# Patient Record
Sex: Male | Born: 1935
Health system: Southern US, Community
[De-identification: ages and names within clinical notes are randomized; demographics above are authoritative.]

## PROBLEM LIST (undated history)

## (undated) DIAGNOSIS — IMO0002 Reserved for concepts with insufficient information to code with codable children: Secondary | ICD-10-CM

## (undated) DIAGNOSIS — M1990 Unspecified osteoarthritis, unspecified site: Secondary | ICD-10-CM

## (undated) DIAGNOSIS — I10 Essential (primary) hypertension: Secondary | ICD-10-CM

## (undated) DIAGNOSIS — I739 Peripheral vascular disease, unspecified: Secondary | ICD-10-CM

## (undated) DIAGNOSIS — I779 Disorder of arteries and arterioles, unspecified: Secondary | ICD-10-CM

## (undated) DIAGNOSIS — G9340 Encephalopathy, unspecified: Secondary | ICD-10-CM

## (undated) DIAGNOSIS — I219 Acute myocardial infarction, unspecified: Secondary | ICD-10-CM

## (undated) DIAGNOSIS — I2510 Atherosclerotic heart disease of native coronary artery without angina pectoris: Secondary | ICD-10-CM

## (undated) DIAGNOSIS — D649 Anemia, unspecified: Secondary | ICD-10-CM

## (undated) HISTORY — DX: Acute myocardial infarction, unspecified: I21.9

---
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
## 1973-01-13 HISTORY — PX: OTHER SURGICAL HISTORY: SHX169

## 2010-12-12 DIAGNOSIS — E113299 Type 2 diabetes mellitus with mild nonproliferative diabetic retinopathy without macular edema, unspecified eye: Secondary | ICD-10-CM | POA: Insufficient documentation

## 2010-12-12 DIAGNOSIS — H348392 Tributary (branch) retinal vein occlusion, unspecified eye, stable: Secondary | ICD-10-CM | POA: Insufficient documentation

## 2011-06-05 DIAGNOSIS — H43819 Vitreous degeneration, unspecified eye: Secondary | ICD-10-CM | POA: Insufficient documentation

## 2011-06-05 DIAGNOSIS — Z961 Presence of intraocular lens: Secondary | ICD-10-CM | POA: Insufficient documentation

## 2011-06-28 ENCOUNTER — Encounter (HOSPITAL_COMMUNITY): Payer: Self-pay | Admitting: Emergency Medicine

## 2011-06-28 ENCOUNTER — Emergency Department (HOSPITAL_COMMUNITY): Payer: Medicare Other

## 2011-06-28 ENCOUNTER — Inpatient Hospital Stay (HOSPITAL_COMMUNITY)
Admission: AD | Admit: 2011-06-28 | Discharge: 2011-07-04 | DRG: 246 | Disposition: A | Discharge status: Home / Self Care | Payer: Medicare Other | Attending: Cardiology | Admitting: Cardiology

## 2011-06-28 DIAGNOSIS — I214 Non-ST elevation (NSTEMI) myocardial infarction: Principal | ICD-10-CM

## 2011-06-28 DIAGNOSIS — E1169 Type 2 diabetes mellitus with other specified complication: Secondary | ICD-10-CM | POA: Diagnosis present

## 2011-06-28 DIAGNOSIS — I10 Essential (primary) hypertension: Secondary | ICD-10-CM | POA: Diagnosis present

## 2011-06-28 DIAGNOSIS — Z794 Long term (current) use of insulin: Secondary | ICD-10-CM

## 2011-06-28 DIAGNOSIS — G92 Toxic encephalopathy: Secondary | ICD-10-CM | POA: Diagnosis not present

## 2011-06-28 DIAGNOSIS — Z7902 Long term (current) use of antithrombotics/antiplatelets: Secondary | ICD-10-CM

## 2011-06-28 DIAGNOSIS — Z7982 Long term (current) use of aspirin: Secondary | ICD-10-CM

## 2011-06-28 DIAGNOSIS — I4891 Unspecified atrial fibrillation: Secondary | ICD-10-CM | POA: Diagnosis present

## 2011-06-28 DIAGNOSIS — G929 Unspecified toxic encephalopathy: Secondary | ICD-10-CM | POA: Diagnosis not present

## 2011-06-28 DIAGNOSIS — I2510 Atherosclerotic heart disease of native coronary artery without angina pectoris: Secondary | ICD-10-CM | POA: Diagnosis present

## 2011-06-28 DIAGNOSIS — D649 Anemia, unspecified: Secondary | ICD-10-CM | POA: Diagnosis present

## 2011-06-28 DIAGNOSIS — T50995A Adverse effect of other drugs, medicaments and biological substances, initial encounter: Secondary | ICD-10-CM | POA: Diagnosis not present

## 2011-06-28 DIAGNOSIS — Z79899 Other long term (current) drug therapy: Secondary | ICD-10-CM

## 2011-06-28 HISTORY — DX: Reserved for concepts with insufficient information to code with codable children: IMO0002

## 2011-06-28 HISTORY — DX: Anemia, unspecified: D64.9

## 2011-06-28 HISTORY — DX: Essential (primary) hypertension: I10

## 2011-06-28 HISTORY — DX: Atherosclerotic heart disease of native coronary artery without angina pectoris: I25.10

## 2011-06-28 HISTORY — DX: Disorder of arteries and arterioles, unspecified: I77.9

## 2011-06-28 HISTORY — DX: Encephalopathy, unspecified: G93.40

## 2011-06-28 HISTORY — DX: Peripheral vascular disease, unspecified: I73.9

## 2011-06-28 LAB — BASIC METABOLIC PANEL
Chloride: 101 mEq/L (ref 96–112)
GFR calc Af Amer: 58 mL/min — ABNORMAL LOW (ref >90)
GFR calc non Af Amer: 50 mL/min — ABNORMAL LOW (ref >90)
Glucose, Bld: 245 mg/dL — ABNORMAL HIGH (ref 70–99)
Potassium: 3.9 mEq/L (ref 3.5–5.1)
Sodium: 136 mEq/L (ref 135–145)

## 2011-06-28 LAB — CBC
Hemoglobin: 13.3 g/dL (ref 13.0–17.0)
MCHC: 35.1 g/dL (ref 30.0–36.0)
RDW: 12.9 % (ref 11.5–15.5)
WBC: 6.2 10*3/uL (ref 4.0–10.5)

## 2011-06-28 LAB — POCT I-STAT TROPONIN I

## 2011-06-28 MED ORDER — HEPARIN (PORCINE) IN NACL 100-0.45 UNIT/ML-% IJ SOLN
950.0000 [IU]/h | INTRAMUSCULAR | Status: DC
  Administered 2011-06-28: 1000 [IU]/h via INTRAVENOUS
  Administered 2011-06-29: 850 [IU]/h via INTRAVENOUS
  Administered 2011-06-30: 950 [IU]/h via INTRAVENOUS
  Filled 2011-06-28 (×4): qty 250

## 2011-06-28 MED ORDER — ASPIRIN 81 MG PO CHEW
324.0000 mg | CHEWABLE_TABLET | Freq: Once | ORAL | Status: AC
Start: 2011-06-29 — End: 2011-07-01
  Administered 2011-07-01: 324 mg via ORAL
  Filled 2011-06-28 (×3): qty 4

## 2011-06-28 MED ORDER — HEPARIN BOLUS VIA INFUSION
4000.0000 [IU] | Freq: Once | INTRAVENOUS | Status: AC
  Administered 2011-06-28: 4000 [IU] via INTRAVENOUS

## 2011-06-28 MED ORDER — ONDANSETRON HCL 4 MG/2ML IJ SOLN
4.0000 mg | Freq: Once | INTRAMUSCULAR | Status: AC
  Administered 2011-06-29: 4 mg via INTRAVENOUS
  Filled 2011-06-28: qty 2

## 2011-06-28 MED ORDER — MORPHINE SULFATE 4 MG/ML IJ SOLN
4.0000 mg | Freq: Once | INTRAMUSCULAR | Status: AC
  Administered 2011-06-29: 4 mg via INTRAVENOUS
  Filled 2011-06-28: qty 1

## 2011-06-28 NOTE — ED Notes (Signed)
Pt alert, c/o mid sternal chest pain,  Pain described dull, radiates left to right, onset was last week, pt states seen PCP given scripts for reflux, resp even unlabored, skin pwd

## 2011-06-28 NOTE — ED Provider Notes (Signed)
History     CSN: [id number]  Arrival date & time 06/28/11  2217   First MD Initiated Contact with Patient 06/28/11 2316      Chief Complaint  Patient presents with  . Chest Pain    (Consider location/radiation/quality/duration/timing/severity/associated sxs/prior treatment) HPI History provided by patient and family bedside. Has been having on and off chest pain all week, evaluated by PCP and started on Protonix. Today woke up feeling well in his normal state of health, did yard work during the day and tonight developed chest pain across his chest. He describes it as dull and radiates to both right and left arm. No shortness of breath. No diaphoresis. No nausea. No history of similar symptoms. He denies any burning or reflux. No leg pain or swelling. No fever chills. No cough. No history of known heart disease. History of diabetes and hypertension. Pain is 5/10 at this time. No known aggravating or alleviating factors.  Past Medical History  Diagnosis Date  . Diabetes mellitus   . Hypertension     Past Surgical History  Procedure Date  . Appendectomy     No family history on file.  History  Substance Use Topics  . Smoking status: Never Smoker   . Smokeless tobacco: Not on file  . Alcohol Use: No      Review of Systems  Constitutional: Negative for fever and chills.  HENT: Negative for neck pain and neck stiffness.   Eyes: Negative for pain.  Respiratory: Negative for shortness of breath.   Cardiovascular: Positive for chest pain.  Gastrointestinal: Negative for abdominal pain.  Genitourinary: Negative for dysuria.  Musculoskeletal: Negative for back pain.  Skin: Negative for rash.  Neurological: Negative for headaches.  All other systems reviewed and are negative.    Allergies  Review of patient's allergies indicates no known allergies.  Home Medications   Current Outpatient Rx  Name Route Sig Dispense Refill  . ASPIRIN 325 MG PO TABS Oral Take 325 mg by  mouth daily.    Marland Kitchen DIPHENHYDRAMINE HCL 25 MG PO CAPS Oral Take 25 mg by mouth daily.    . GLYBURIDE 5 MG PO TABS Oral Take 10 mg by mouth 2 (two) times daily with a meal.    . INSULIN DETEMIR 100 UNIT/ML [place] SOLN Subcutaneous Inject 15-38 Units into the skin at bedtime. 15 at bedtime 36-38 in the morning    . LEVOTHYROXINE SODIUM 25 MCG PO TABS Oral Take 25 mcg by mouth daily.    Marland Kitchen LISINOPRIL-HYDROCHLOROTHIAZIDE 10-12.5 MG PO TABS Oral Take 1 tablet by mouth daily.    Marland Kitchen NAPROXEN SODIUM 220 MG PO TABS Oral Take 220 mg by mouth 2 (two) times daily with a meal.    . OMEGA-3-ACID ETHYL ESTERS 1 G PO CAPS Oral Take 1 g by mouth 3 (three) times daily.    Marland Kitchen PANTOPRAZOLE SODIUM 40 MG PO TBEC Oral Take 40 mg by mouth daily.    Marland Kitchen SIMVASTATIN 40 MG PO TABS Oral Take 20 mg by mouth at bedtime.      BP 179/69  Pulse 80  Temp 97.7 F (36.5 C) (Oral)  Resp 16  Wt 175 lb (79.379 kg)  SpO2 100%  Physical Exam  Constitutional: He is oriented to person, place, and time. He appears well-developed and well-nourished.  HENT:  Head: Normocephalic and atraumatic.  Eyes: Conjunctivae and EOM are normal. Pupils are equal, round, and reactive to light.  Neck: Trachea normal. Neck supple. No thyromegaly present.  Cardiovascular: Normal rate, regular rhythm, S1 normal, S2 normal and normal pulses.     No systolic murmur is present   No diastolic murmur is present  Pulses:      Radial pulses are 2+ on the right side, and 2+ on the left side.  Pulmonary/Chest: Effort normal and breath sounds normal. He has no wheezes. He has no rhonchi. He has no rales. He exhibits no tenderness.  Abdominal: Soft. Normal appearance and bowel sounds are normal. There is no tenderness. There is no CVA tenderness and negative Murphy's sign.  Musculoskeletal:       BLE:s Calves nontender, no cords or erythema, negative Homans sign  Neurological: He is alert and oriented to person, place, and time. He has normal strength. No cranial  nerve deficit or sensory deficit. GCS eye subscore is 4. GCS verbal subscore is 5. GCS motor subscore is 6.  Skin: Skin is warm and dry. No rash noted. He is not diaphoretic.  Psychiatric: His speech is normal.       Cooperative and appropriate    ED Course  Procedures (including critical care time)  Labs Reviewed  CBC - Abnormal; Notable for the following:    RBC 4.05 (*)     HCT 37.9 (*)     Platelets 148 (*)     All other components within normal limits  BASIC METABOLIC PANEL - Abnormal; Notable for the following:    Glucose, Bld 245 (*)     BUN 27 (*)     GFR calc non Af Amer 50 (*)     GFR calc Af Amer 58 (*)     All other components within normal limits  POCT I-STAT TROPONIN I - Abnormal; Notable for the following:    Troponin i, poc 0.28 (*)     All other components within normal limits   Dg Chest 2 View  06/28/2011  *RADIOLOGY REPORT*  Clinical Data: Left-sided chest pain; history of diabetes.  CHEST - 2 VIEW  Comparison: None.  Findings: Mild left basilar opacity, better seen on the lateral view, may reflect atelectasis given elevation of the left hemidiaphragm.  There is no evidence of pleural effusion or pneumothorax.  The heart is borderline normal in size; the mediastinal contour is within normal limits.  No acute osseous abnormalities are seen.  IMPRESSION: Elevation of the left hemidiaphragm; mild left basilar opacity likely reflects atelectasis, though mild pneumonia could conceivably have a similar appearance.  Original Report Authenticated By: Tonia Ghent, M.D.    Date: 06/29/2011  Rate: 86  Rhythm: normal sinus rhythm  QRS Axis: left  Intervals: PR prolonged  ST/T Wave abnormalities: nonspecific ST/T changes  Conduction Disutrbances:first-degree A-V block  and right bundle branch block  Narrative Interpretation: T wave inversions V1-V3  Old EKG Reviewed: none available  EKG reviewed. Labs and imaging reviewed as above.   11:58 PM d/w Dr Isabella Stalling, fellow  on call for CAR, he accepts PT in Tx to [facility] for NSTEMI.   CRITICAL CARE Performed by: Sunnie Nielsen   Total critical care time: 45  Critical care time was exclusive of separately billable procedures and treating other patients.  Critical care was necessary to treat or prevent imminent or life-threatening deterioration.  Critical care was time spent personally by me on the following activities: development of treatment plan with patient and/or surrogate as well as nursing, discussions with consultants, evaluation of patient's response to treatment, examination of patient, obtaining history from patient or surrogate, ordering and  performing treatments and interventions, ordering and review of laboratory studies, ordering and review of radiographic studies, pulse oximetry and re-evaluation of patient's condition.  MDM    chest pain concerning for ACS with EKG reviewed as above. Aspirin prior to arrival 325 mg by mouth. IV morphine for pain. Troponin reviewed as above elevated without ST elevations on EKG. No back pain or tearing pain or widened mediastinum on chest x-ray to suggest dissection. Heparin initiated. Discuss with cardiologist as above. Plan transfer to [facility] cone for admission to cardiac ICU.          Sunnie Nielsen, MD 06/29/11 [phone]

## 2011-06-28 NOTE — ED Notes (Signed)
Pt states he went to his [facility] June 10th and he received a prescription for protonix for indigestion,  He says this is same type of pain tonight,  Denies sob, but admits to some nausea,  Pt is alert and oriented in NAD, pt lives at home by himself.

## 2011-06-29 ENCOUNTER — Encounter (HOSPITAL_COMMUNITY): Payer: Self-pay | Admitting: *Deleted

## 2011-06-29 DIAGNOSIS — I214 Non-ST elevation (NSTEMI) myocardial infarction: Secondary | ICD-10-CM

## 2011-06-29 DIAGNOSIS — R079 Chest pain, unspecified: Secondary | ICD-10-CM

## 2011-06-29 LAB — DIFFERENTIAL
Basophils Absolute: 0 10*3/uL (ref 0.0–0.1)
Basophils Relative: 1 % (ref 0–1)
Eosinophils Absolute: 0.1 10*3/uL (ref 0.0–0.7)
Eosinophils Relative: 1 % (ref 0–5)
Lymphocytes Relative: 21 % (ref 12–46)
Lymphs Abs: 1.1 10*3/uL (ref 0.7–4.0)
Monocytes Absolute: 0.4 10*3/uL (ref 0.1–1.0)
Monocytes Relative: 7 % (ref 3–12)
Neutro Abs: 3.7 10*3/uL (ref 1.7–7.7)
Neutrophils Relative %: 70 % (ref 43–77)

## 2011-06-29 LAB — CARDIAC PANEL(CRET KIN+CKTOT+MB+TROPI)
CK, MB: 95.8 ng/mL (ref 0.3–4.0)
CK, MB: 162.2 ng/mL (ref 0.3–4.0)
Relative Index: 8.9 — ABNORMAL HIGH (ref 0.0–2.5)
Relative Index: 8.2 — ABNORMAL HIGH (ref 0.0–2.5)
Relative Index: 6 — ABNORMAL HIGH (ref 0.0–2.5)
Total CK: 1074 U/L — ABNORMAL HIGH (ref 7–232)
Troponin I: 5.53 ng/mL (ref <0.30)
Troponin I: 15.31 ng/mL (ref <0.30)
Troponin I: >20 ng/mL (ref <0.30)

## 2011-06-29 LAB — BASIC METABOLIC PANEL
BUN: 23 mg/dL (ref 6–23)
CO2: 22 mEq/L (ref 19–32)
Chloride: 103 mEq/L (ref 96–112)
Creatinine, Ser: 1.09 mg/dL (ref 0.50–1.35)
Glucose, Bld: 191 mg/dL — ABNORMAL HIGH (ref 70–99)

## 2011-06-29 LAB — LIPID PANEL
Cholesterol: 139 mg/dL (ref 0–200)
HDL: 58 mg/dL (ref >39)
HDL: 62 mg/dL (ref >39)
LDL Cholesterol: 75 mg/dL (ref 0–99)
LDL Cholesterol: 75 mg/dL (ref 0–99)
Total CHOL/HDL Ratio: 2.4 RATIO
Total CHOL/HDL Ratio: 2.3 RATIO
Triglycerides: 29 mg/dL (ref <150)
VLDL: 6 mg/dL (ref 0–40)
VLDL: 7 mg/dL (ref 0–40)

## 2011-06-29 LAB — COMPREHENSIVE METABOLIC PANEL
AST: 43 U/L — ABNORMAL HIGH (ref 0–37)
Albumin: 3.3 g/dL — ABNORMAL LOW (ref 3.5–5.2)
Chloride: 105 mEq/L (ref 96–112)
Creatinine, Ser: 1.09 mg/dL (ref 0.50–1.35)
Total Bilirubin: 0.4 mg/dL (ref 0.3–1.2)

## 2011-06-29 LAB — MRSA PCR SCREENING: MRSA by PCR: NEGATIVE

## 2011-06-29 LAB — PROTIME-INR
INR: 1.17 (ref 0.00–1.49)
Prothrombin Time: 15.1 seconds (ref 11.6–15.2)

## 2011-06-29 LAB — APTT: aPTT: >200 seconds (ref 24–37)

## 2011-06-29 LAB — GLUCOSE, CAPILLARY: Glucose-Capillary: 246 mg/dL — ABNORMAL HIGH (ref 70–99)

## 2011-06-29 LAB — PRO B NATRIURETIC PEPTIDE: Pro B Natriuretic peptide (BNP): 734.7 pg/mL — ABNORMAL HIGH (ref 0–450)

## 2011-06-29 LAB — CBC
MCV: 92.8 fL (ref 78.0–100.0)
Platelets: 133 10*3/uL — ABNORMAL LOW (ref 150–400)
RDW: 12.9 % (ref 11.5–15.5)
WBC: 5.3 10*3/uL (ref 4.0–10.5)

## 2011-06-29 LAB — TSH: TSH: 4.869 u[IU]/mL — ABNORMAL HIGH (ref 0.350–4.500)

## 2011-06-29 LAB — HEMOGLOBIN A1C
Hgb A1c MFr Bld: 8 % — ABNORMAL HIGH (ref <5.7)
Mean Plasma Glucose: 183 mg/dL — ABNORMAL HIGH (ref <117)

## 2011-06-29 LAB — HEPARIN LEVEL (UNFRACTIONATED)
Heparin Unfractionated: 0.9 IU/mL — ABNORMAL HIGH (ref 0.30–0.70)
Heparin Unfractionated: 0.53 IU/mL (ref 0.30–0.70)

## 2011-06-29 MED ORDER — ACETAMINOPHEN 325 MG PO TABS: 650.0000 mg | ORAL_TABLET | ORAL | Status: DC | PRN

## 2011-06-29 MED ORDER — ONDANSETRON HCL 4 MG/2ML IJ SOLN: 4.0000 mg | Freq: Four times a day (QID) | INTRAMUSCULAR | Status: DC | PRN

## 2011-06-29 MED ORDER — SODIUM CHLORIDE 0.9 % IV SOLN
INTRAVENOUS | Status: DC
  Administered 2011-06-30 – 2011-07-01 (×2): via INTRAVENOUS

## 2011-06-29 MED ORDER — GLYBURIDE 5 MG PO TABS
10.0000 mg | ORAL_TABLET | Freq: Two times a day (BID) | ORAL | Status: DC
  Administered 2011-06-29 (×2): 10 mg via ORAL
  Filled 2011-06-29 (×7): qty 2

## 2011-06-29 MED ORDER — ONDANSETRON 8 MG/NS 50 ML IVPB: 8.0000 mg | Freq: Three times a day (TID) | INTRAVENOUS | Status: DC | PRN

## 2011-06-29 MED ORDER — LISINOPRIL-HYDROCHLOROTHIAZIDE 10-12.5 MG PO TABS: 1.0000 | ORAL_TABLET | Freq: Every day | ORAL | Status: DC

## 2011-06-29 MED ORDER — HYDROMORPHONE HCL PF 1 MG/ML IJ SOLN: 0.5000 mg | INTRAMUSCULAR | Status: DC | PRN

## 2011-06-29 MED ORDER — HYDROCHLOROTHIAZIDE 12.5 MG PO CAPS
12.5000 mg | ORAL_CAPSULE | Freq: Every day | ORAL | Status: DC
  Administered 2011-06-29 – 2011-07-04 (×6): 12.5 mg via ORAL
  Filled 2011-06-29 (×6): qty 1

## 2011-06-29 MED ORDER — MORPHINE SULFATE 2 MG/ML IJ SOLN: 2.0000 mg | INTRAMUSCULAR | Status: DC | PRN

## 2011-06-29 MED ORDER — ASPIRIN EC 81 MG PO TBEC
81.0000 mg | DELAYED_RELEASE_TABLET | Freq: Every day | ORAL | Status: DC
  Administered 2011-06-30 – 2011-07-04 (×4): 81 mg via ORAL
  Filled 2011-06-29 (×5): qty 1

## 2011-06-29 MED ORDER — INSULIN DETEMIR 100 UNIT/ML ~~LOC~~ SOLN: 15.0000 [IU] | Freq: Every day | SUBCUTANEOUS | Status: DC

## 2011-06-29 MED ORDER — PANTOPRAZOLE SODIUM 40 MG PO TBEC
40.0000 mg | DELAYED_RELEASE_TABLET | Freq: Every day | ORAL | Status: DC
  Administered 2011-06-30 – 2011-07-04 (×5): 40 mg via ORAL
  Filled 2011-06-29 (×5): qty 1

## 2011-06-29 MED ORDER — NITROGLYCERIN 0.4 MG SL SUBL: 0.4000 mg | SUBLINGUAL_TABLET | SUBLINGUAL | Status: DC | PRN

## 2011-06-29 MED ORDER — LISINOPRIL 10 MG PO TABS
10.0000 mg | ORAL_TABLET | Freq: Every day | ORAL | Status: DC
  Administered 2011-06-29 – 2011-07-04 (×6): 10 mg via ORAL
  Filled 2011-06-29 (×6): qty 1

## 2011-06-29 MED ORDER — INSULIN DETEMIR 100 UNIT/ML ~~LOC~~ SOLN
38.0000 [IU] | Freq: Every day | SUBCUTANEOUS | Status: DC
  Administered 2011-06-29: 38 [IU] via SUBCUTANEOUS
  Filled 2011-06-29: qty 10

## 2011-06-29 MED ORDER — CARVEDILOL 6.25 MG PO TABS
6.2500 mg | ORAL_TABLET | Freq: Two times a day (BID) | ORAL | Status: DC
  Administered 2011-06-29 – 2011-07-04 (×8): 6.25 mg via ORAL
  Filled 2011-06-29 (×13): qty 1

## 2011-06-29 MED ORDER — ISOSORBIDE MONONITRATE ER 30 MG PO TB24
30.0000 mg | ORAL_TABLET | Freq: Every day | ORAL | Status: DC
  Administered 2011-06-29 – 2011-07-04 (×5): 30 mg via ORAL
  Filled 2011-06-29 (×7): qty 1

## 2011-06-29 MED ORDER — FUROSEMIDE 10 MG/ML IJ SOLN
20.0000 mg | Freq: Once | INTRAMUSCULAR | Status: AC
  Administered 2011-06-29: 20 mg via INTRAVENOUS
  Filled 2011-06-29: qty 2

## 2011-06-29 MED ORDER — SODIUM CHLORIDE 0.9 % IJ SOLN
3.0000 mL | Freq: Two times a day (BID) | INTRAMUSCULAR | Status: DC
  Administered 2011-06-29 – 2011-07-02 (×7): 3 mL via INTRAVENOUS

## 2011-06-29 MED ORDER — MORPHINE SULFATE 2 MG/ML IJ SOLN
INTRAMUSCULAR | Status: AC
  Administered 2011-06-29: 2 mg via INTRAVENOUS
  Filled 2011-06-29: qty 1

## 2011-06-29 MED ORDER — SIMVASTATIN 20 MG PO TABS
20.0000 mg | ORAL_TABLET | Freq: Every day | ORAL | Status: DC
  Administered 2011-06-29 – 2011-07-03 (×4): 20 mg via ORAL
  Filled 2011-06-29 (×6): qty 1

## 2011-06-29 MED ORDER — INSULIN DETEMIR 100 UNIT/ML ~~LOC~~ SOLN
15.0000 [IU] | Freq: Every day | SUBCUTANEOUS | Status: DC
  Administered 2011-06-29: 15 [IU] via SUBCUTANEOUS
  Filled 2011-06-29: qty 10

## 2011-06-29 MED ORDER — ONDANSETRON HCL 4 MG/2ML IJ SOLN
INTRAMUSCULAR | Status: AC
  Administered 2011-06-29: 4 mg
  Filled 2011-06-29: qty 2

## 2011-06-29 MED ORDER — LEVOTHYROXINE SODIUM 25 MCG PO TABS
25.0000 ug | ORAL_TABLET | Freq: Every day | ORAL | Status: DC
  Administered 2011-06-29 – 2011-07-04 (×6): 25 ug via ORAL
  Filled 2011-06-29 (×7): qty 1

## 2011-06-29 MED ORDER — OMEGA-3-ACID ETHYL ESTERS 1 G PO CAPS
1.0000 g | ORAL_CAPSULE | Freq: Three times a day (TID) | ORAL | Status: DC
  Administered 2011-06-29 – 2011-07-04 (×13): 1 g via ORAL
  Filled 2011-06-29 (×18): qty 1

## 2011-06-29 NOTE — Progress Notes (Signed)
eLink Physician-Brief Progress Note Patient Name: Levi Robinson DOB: 02/14/1921 MRN: [id number]  Date of Service  06/29/2011   HPI/Events of Note  Called nursing re tachycardia, hypertension.  Patient vomiting.  May be reaction to morhpine.     eICU Interventions  Zofran q4 hours PNR Change morphine to dilaudid Can assist with antihypertensives if primary team unavailable shortly.     Intervention Category Intermediate Interventions: Communication with other healthcare providers and/or family;Pain - evaluation and management;Other:  Levi Robinson 06/29/2011, 1:51 AM

## 2011-06-29 NOTE — Progress Notes (Signed)
ANTICOAGULATION CONSULT NOTE - Follow-up Consult  Pharmacy Consult for Heparin Indication: chest pain/ACS  No Known Allergies  Patient Measurements: Height: 5\' 5"  (165.1 cm) Weight: 163 lb 9.3 oz (74.2 kg) IBW/kg (Calculated) : 61.5  Heparin Dosing Weight: 70 kg   Vital Signs: Temp: 97.8 F (36.6 C) (06/16 0700) Temp src: Oral (06/16 0700) BP: 117/49 mmHg (06/16 0700) Pulse Rate: 56  (06/16 0700)  Labs:  Basename 06/29/11 0850 06/29/11 0505 06/29/11 0500 06/28/11 2301  HGB -- 12.2* -- 13.3  HCT -- 36.0* -- 37.9*  PLT -- 133* -- 148*  APTT -- >200* -- --  LABPROT -- 15.1 -- --  INR -- 1.17 -- --  HEPARINUNFRC 0.90* -- -- --  CREATININE -- 1.09 1.09 1.25  CKTOTAL -- -- 1074* --  CKMB -- -- 95.8* --  TROPONINI -- -- 5.53* --    Estimated Creatinine Clearance: 45 ml/min (by C-G formula based on Cr of 1.09).   Medical History: Past Medical History  Diagnosis Date  . Diabetes mellitus   . Hypertension     Medications:  Prescriptions prior to admission  Medication Sig Dispense Refill  . aspirin 325 MG tablet Take 325 mg by mouth daily.      . diphenhydrAMINE (BENADRYL) 25 mg capsule Take 25 mg by mouth daily.      Marland Kitchen glyBURIDE (DIABETA) 5 MG tablet Take 10 mg by mouth 2 (two) times daily with a meal.      . insulin detemir (LEVEMIR) 100 UNIT/ML injection Inject 15-38 Units into the skin at bedtime. 15 at bedtime 36-38 in the morning      . levothyroxine (SYNTHROID, LEVOTHROID) 25 MCG tablet Take 25 mcg by mouth daily.      Marland Kitchen lisinopril-hydrochlorothiazide (PRINZIDE,ZESTORETIC) 10-12.5 MG per tablet Take 1 tablet by mouth daily.      . naproxen sodium (ANAPROX) 220 MG tablet Take 220 mg by mouth 2 (two) times daily with a meal.      . omega-3 acid ethyl esters (LOVAZA) 1 G capsule Take 1 g by mouth 3 (three) times daily.      . pantoprazole (PROTONIX) 40 MG tablet Take 40 mg by mouth daily.      . simvastatin (ZOCOR) 40 MG tablet Take 20 mg by mouth at bedtime.         Assessment: 78 yo M admitted with chest pain, found with NSTEMI. Placed on heparin in anticipation for elective cath tmrw. Currently supratherapeutic on 1000 units/hr. Hgb and Plt ct are slightly low. Patient denies any bleeding/bruising.   Goal of Therapy:  Heparin level 0.3-0.7 units/ml Monitor platelets by anticoagulation protocol: Yes   Plan:  1. Decrease heparin to 850 units/hr = 8.5 mL/hr 2. Repeat heparin level 8 hours after rate change @1800 . 3. Monitor CBC and for Sx bleeding  Frutoso Chase, PharmD pgr [phone] 06/29/2011, 10:13 AM  Thank you for allowing pharmacy to be part of this patients care team.

## 2011-06-29 NOTE — Progress Notes (Signed)
ANTICOAGULATION CONSULT NOTE - Follow Up Consult  Pharmacy Consult for Heparin Indication: chest pain/ACS  No Known Allergies  Patient Measurements: Height: 5\' 5"  (165.1 cm) Weight: 163 lb 9.3 oz (74.2 kg) IBW/kg (Calculated) : 61.5  Heparin Dosing Weight: 70 kg Vital Signs: Temp: 98.7 F (37.1 C) (06/16 1200) Temp src: Oral (06/16 1200) BP: 103/46 mmHg (06/16 1800) Pulse Rate: 68  (06/16 1800)  Labs:  Basename 06/29/11 1731 06/29/11 1049 06/29/11 0850 06/29/11 0505 06/29/11 0500 06/28/11 2301  HGB -- -- -- 12.2* -- 13.3  HCT -- -- -- 36.0* -- 37.9*  PLT -- -- -- 133* -- 148*  APTT -- -- -- >200* -- --  LABPROT -- -- -- 15.1 -- --  INR -- -- -- 1.17 -- --  HEPARINUNFRC 0.53 -- 0.90* -- -- --  CREATININE -- -- -- 1.09 1.09 1.25  CKTOTAL -- 1978* -- -- 1074* --  CKMB -- 162.2* -- -- 95.8* --  TROPONINI -- 15.31* -- -- 5.53* --    Estimated Creatinine Clearance: 45 ml/min (by C-G formula based on Cr of 1.09).      Assessment: 77 yo M admitted with chest pain, found with NSTEMI. Placed on heparin in anticipation for elective cath tmrw.therapeutic on 850 units/hr.  HL=0.53 Hgb and Plt ct are slightly low. Patient denies any bleeding/bruising.   Goal of Therapy:  Heparin level 0.3-0.7 units/ml Monitor platelets by anticoagulation protocol: Yes   Plan:  Cont at 850 units/hr  Daily HL  Lucille Passy 06/29/2011,6:36 PM

## 2011-06-29 NOTE — Progress Notes (Signed)
Report from Night RN. Chart reviewed together. Handoff complete.  

## 2011-06-29 NOTE — H&P (Signed)
Levi Robinson is an 78 y.o. male.    Chief Complaint: Chest pain  HPI: 78 y/o male with a PMH of HTN and IDDM presenting with 1 week history of intermittent chest pain.  His chest pain started about 1 week ago.  He describes it as a sharp pain, 8/10 in severity, non-radiating, associated with shortness of breath and nausea/vomiting.  He was seen by his PCP on June 10th who prescribed Protonix, but his symptoms did not get better.  He presented to [facility] ER earlier with similar symptoms.  His ECG showed sinus rhythm with RBBB and 1st degree AVB, and he was found to have a troponin of 0.28.  Currently, he is chest pain free. He denies any family history of early CAD and he does not smoke.  He is clinically and hemodynamically stable.   Past Medical History  Diagnosis Date  . Diabetes mellitus   . Hypertension     Past Surgical History  Procedure Date  . Appendectomy     History reviewed. No pertinent family history. Social History:  reports that he has never smoked. He does not have any smokeless tobacco history on file. He reports that he does not drink alcohol. His drug history not on file.  Allergies: No Known Allergies  Medications Prior to Admission  Medication Sig Dispense Refill  . aspirin 325 MG tablet Take 325 mg by mouth daily.      . diphenhydrAMINE (BENADRYL) 25 mg capsule Take 25 mg by mouth daily.      Marland Kitchen glyBURIDE (DIABETA) 5 MG tablet Take 10 mg by mouth 2 (two) times daily with a meal.      . insulin detemir (LEVEMIR) 100 UNIT/ML injection Inject 15-38 Units into the skin at bedtime. 15 at bedtime 36-38 in the morning      . levothyroxine (SYNTHROID, LEVOTHROID) 25 MCG tablet Take 25 mcg by mouth daily.      Marland Kitchen lisinopril-hydrochlorothiazide (PRINZIDE,ZESTORETIC) 10-12.5 MG per tablet Take 1 tablet by mouth daily.      . naproxen sodium (ANAPROX) 220 MG tablet Take 220 mg by mouth 2 (two) times daily with a meal.      . omega-3 acid ethyl esters (LOVAZA) 1 G capsule  Take 1 g by mouth 3 (three) times daily.      . pantoprazole (PROTONIX) 40 MG tablet Take 40 mg by mouth daily.      . simvastatin (ZOCOR) 40 MG tablet Take 20 mg by mouth at bedtime.        Results for orders placed during the hospital encounter of 06/28/11 (from the past 48 hour(s))  CBC     Status: Abnormal   Collection Time   06/28/11 11:01 PM      Component Value Range Comment   WBC 6.2  4.0 - 10.5 K/uL    RBC 4.05 (*) 4.22 - 5.81 MIL/uL    Hemoglobin 13.3  13.0 - 17.0 g/dL    HCT 98.1 (*) 19.1 - 52.0 %    MCV 93.6  78.0 - 100.0 fL    MCH 32.8  26.0 - 34.0 pg    MCHC 35.1  30.0 - 36.0 g/dL    RDW 47.8  29.5 - 62.1 %    Platelets 148 (*) 150 - 400 K/uL   BASIC METABOLIC PANEL     Status: Abnormal   Collection Time   06/28/11 11:01 PM      Component Value Range Comment   Sodium 136  135 -  145 mEq/L    Potassium 3.9  3.5 - 5.1 mEq/L    Chloride 101  96 - 112 mEq/L    CO2 23  19 - 32 mEq/L    Glucose, Bld 245 (*) 70 - 99 mg/dL    BUN 27 (*) 6 - 23 mg/dL    Creatinine, Ser 4.09  0.50 - 1.35 mg/dL    Calcium 9.2  8.4 - 81.1 mg/dL    GFR calc non Af Amer 50 (*) >90 mL/min    GFR calc Af Amer 58 (*) >90 mL/min   POCT I-STAT TROPONIN I     Status: Abnormal   Collection Time   06/28/11 11:32 PM      Component Value Range Comment   Troponin i, poc 0.28 (*) 0.00 - 0.08 ng/mL    Comment NOTIFIED PHYSICIAN      Comment 3             Dg Chest 2 View  06/28/2011  *RADIOLOGY REPORT*  Clinical Data: Left-sided chest pain; history of diabetes.  CHEST - 2 VIEW  Comparison: None.  Findings: Mild left basilar opacity, better seen on the lateral view, may reflect atelectasis given elevation of the left hemidiaphragm.  There is no evidence of pleural effusion or pneumothorax.  The heart is borderline normal in size; the mediastinal contour is within normal limits.  No acute osseous abnormalities are seen.  IMPRESSION: Elevation of the left hemidiaphragm; mild left basilar opacity likely reflects  atelectasis, though mild pneumonia could conceivably have a similar appearance.  Original Report Authenticated By: Tonia Ghent, M.D.    Review of Systems  Constitutional: Negative for fever, chills, weight loss, malaise/fatigue and diaphoresis.  HENT: Negative for hearing loss, ear pain, nosebleeds, congestion, sore throat, neck pain, tinnitus and ear discharge.   Eyes: Negative for blurred vision, double vision, photophobia, pain, discharge and redness.  Respiratory: Positive for shortness of breath. Negative for cough, hemoptysis, sputum production, wheezing and stridor.   Cardiovascular: Positive for chest pain and leg swelling. Negative for palpitations, orthopnea, claudication and PND.  Gastrointestinal: Positive for nausea. Negative for heartburn, vomiting, abdominal pain, diarrhea, constipation, blood in stool and melena.  Genitourinary: Negative for dysuria, urgency, frequency, hematuria and flank pain.  Musculoskeletal: Negative for myalgias, back pain, joint pain and falls.  Skin: Negative for itching and rash.  Neurological: Negative for dizziness, tingling, tremors, sensory change, speech change, focal weakness, seizures, loss of consciousness, weakness and headaches.  Endo/Heme/Allergies: Negative for environmental allergies and polydipsia. Does not bruise/bleed easily.  Psychiatric/Behavioral: Negative for depression, suicidal ideas and hallucinations.    Blood pressure 179/69, pulse 80, temperature 97.7 F (36.5 C), temperature source Oral, resp. rate 16, height 5\' 5"  (1.651 m), weight 74.2 kg (163 lb 9.3 oz), SpO2 100.00%. Physical Exam  Constitutional: He is oriented to person, place, and time. He appears well-developed and well-nourished. No distress.  HENT:  Head: Normocephalic and atraumatic.  Eyes: EOM are normal. No scleral icterus.  Neck: Normal range of motion. Neck supple. No JVD present. No tracheal deviation present. No thyromegaly present.  Cardiovascular:  Normal rate, regular rhythm and normal heart sounds.  Exam reveals no gallop and no friction rub.   Respiratory: Effort normal. No respiratory distress. He has no wheezes. He has rales. He exhibits no tenderness.  GI: He exhibits no distension. There is no tenderness. There is no rebound and no guarding.  Musculoskeletal: He exhibits edema. He exhibits no tenderness.  Neurological: He is oriented to  person, place, and time. Coordination normal.  Skin: No rash noted. He is not diaphoretic. No erythema. No pallor.  Psychiatric: He has a normal mood and affect.     Assessment/Plan  1. NSTEMI 2. IDDM 3. HTN  Patient is currently admitted to cardiology for treatment of NSTEMI.  He received Aspirin 324 mg, Nitroglycerin, Morphine, and he is on Heparin drip per ACS protocol. I will start him on low dose Lasix since he has mild bibasilar rales and initiate low-dose Carvedilol.  I will obtain serial cardiac markers tonight and obtain a 2-D echocardiogram in the morning to evaluate his left ventricular systolic function.  We will re-evaluate the patient in the morning to make decisions about cardiac catheterization.   Ladarrion Telfair E 06/29/2011, 2:35 AM

## 2011-06-29 NOTE — Progress Notes (Signed)
CRITICAL VALUE ALERT  Critical value received:  CKMB 95.8 Troponin 5.53 Ptt >200  Date of notification:  06/29/2011  Time of notification:  0604  Critical value read back:yes  Nurse who received alert:  Malachy Chamber  MD notified (1st page):  Cards Fellow Aitsebaomoa  Time of first page:  0605   MD notified (2nd page):  Time of second page:  Responding MD:    Time MD responded:

## 2011-06-29 NOTE — Progress Notes (Signed)
Pt. Had a 16 BRVT. Cards fellow Aitseobamao at bedside  And viewed EKG strip will continue to monitor.

## 2011-06-29 NOTE — Progress Notes (Signed)
ANTICOAGULATION CONSULT NOTE - Initial Consult  Pharmacy Consult for Heparin Indication: chest pain/ACS  No Known Allergies  Patient Measurements: Height: 5\' 5"  (165.1 cm) Weight: 163 lb 9.3 oz (74.2 kg) IBW/kg (Calculated) : 61.5  Heparin Dosing Weight: 70 kg   Vital Signs: Temp: 97.7 F (36.5 C) (06/15 2357) Temp src: Oral (06/15 2357) BP: 179/69 mmHg (06/15 2357) Pulse Rate: 80  (06/15 2357)  Labs:  Basename 06/28/11 2301  HGB 13.3  HCT 37.9*  PLT 148*  APTT --  LABPROT --  INR --  HEPARINUNFRC --  CREATININE 1.25  CKTOTAL --  CKMB --  TROPONINI --    Estimated Creatinine Clearance: 39.2 ml/min (by C-G formula based on Cr of 1.25).   Medical History: Past Medical History  Diagnosis Date  . Diabetes mellitus   . Hypertension     Medications:  Prescriptions prior to admission  Medication Sig Dispense Refill  . aspirin 325 MG tablet Take 325 mg by mouth daily.      . diphenhydrAMINE (BENADRYL) 25 mg capsule Take 25 mg by mouth daily.      Marland Kitchen glyBURIDE (DIABETA) 5 MG tablet Take 10 mg by mouth 2 (two) times daily with a meal.      . insulin detemir (LEVEMIR) 100 UNIT/ML injection Inject 15-38 Units into the skin at bedtime. 15 at bedtime 36-38 in the morning      . levothyroxine (SYNTHROID, LEVOTHROID) 25 MCG tablet Take 25 mcg by mouth daily.      Marland Kitchen lisinopril-hydrochlorothiazide (PRINZIDE,ZESTORETIC) 10-12.5 MG per tablet Take 1 tablet by mouth daily.      . naproxen sodium (ANAPROX) 220 MG tablet Take 220 mg by mouth 2 (two) times daily with a meal.      . omega-3 acid ethyl esters (LOVAZA) 1 G capsule Take 1 g by mouth 3 (three) times daily.      . pantoprazole (PROTONIX) 40 MG tablet Take 40 mg by mouth daily.      . simvastatin (ZOCOR) 40 MG tablet Take 20 mg by mouth at bedtime.        Assessment: 78 yo male with chest pain for Heparin.  Heparin 4000 units IV bolus, 1000 units/hr started at [facility] at midnight.  Goal of Therapy:  Heparin level  0.3-0.7 units/ml Monitor platelets by anticoagulation protocol: Yes   Plan:  Continue Heparin at current rate Follow-up am labs.  Ondre Salvetti, Gary Fleet 06/29/2011,2:37 AM

## 2011-06-29 NOTE — Progress Notes (Signed)
  Echocardiogram 2D Echocardiogram has been performed.  Anthonny Schiller L 06/29/2011, 1:09 PM

## 2011-06-29 NOTE — Progress Notes (Signed)
Peyton Bottoms, MD, [facility] ABIM Board Certified in Adult Cardiovascular Medicine,Internal Medicine and Critical Care Medicine      Subjective:   Patient has no recurrent chest pain.  Cardiac troponins are positive.EKG showed left anterior hemiblock and probable posterior wall infarction.  BNP was mildly elevated but patient has no clinical heart failure symptoms.  He did receive a single dose of Lasix yesterday.  He reports no orthopnea or PND no palpitations or syncope.  Objective:   Weight Range:  Vital Signs:   Temp:  [97.5 F (36.4 C)-97.8 F (36.6 C)] 97.8 F (36.6 C) (06/16 0700) Pulse Rate:  [56-84] 56  (06/16 0700) Resp:  [10-17] 14  (06/16 0700) BP: (117-184)/(49-77) 117/49 mmHg (06/16 0700) SpO2:  [98 %-100 %] 100 % (06/16 0700) Weight:  [163 lb 9.3 oz (74.2 kg)-175 lb (79.379 kg)] 163 lb 9.3 oz (74.2 kg) (06/16 0207)    Weight change: Filed Weights   06/28/11 2217 06/29/11 0207  Weight: 175 lb (79.379 kg) 163 lb 9.3 oz (74.2 kg)    Intake/Output:   Intake/Output Summary (Last 24 hours) at 06/29/11 1610 Last data filed at 06/29/11 0913  Gross per 24 hour  Intake      0 ml  Output   1500 ml  Net  -1500 ml     Physical Exam: General: Well-nourished male in no distress, complaining he is hungry Neck: Normal carotid upstroke and no carotid bruits.  No thyromegaly no nodular thyroid. Lungs: Clear breath sounds bilaterally without wheezing Heart: Regular rate and rhythm with 1/6 ejection murmur right upper sternal border otherwise no pathological murmurs and normal S1 and S2. Abdomen: Soft nontender no rebound or guarding and good bowel sounds Extremity exam: No edema.  Normal distal pulses. Neurologic: Alert and oriented and grossly nonfocal. Psychiatric: Appropriate with normal affect  Telemetry:normal sinus rhythm  Labs: Basic Metabolic Panel:  Lab 06/29/11 9604 06/29/11 0500 06/28/11 2301  NA 137 137 136  K 4.1 4.1 3.9  CL 105 103 101  CO2 21 22 23     GLUCOSE 191* 191* 245*  BUN 23 23 27*  CREATININE 1.09 1.09 1.25  CALCIUM 8.8 8.8 9.2  MG 2.2 -- --  PHOS -- -- --    Liver Function Tests:  Lab 06/29/11 0505  AST 43*  ALT 14  ALKPHOS 54  BILITOT 0.4  PROT 6.6  ALBUMIN 3.3*   No results found for this basename: LIPASE:5,AMYLASE:5 in the last 168 hours No results found for this basename: AMMONIA:3 in the last 168 hours  CBC:  Lab 06/29/11 0505 06/28/11 2301  WBC 5.3 6.2  NEUTROABS 3.7 --  HGB 12.2* 13.3  HCT 36.0* 37.9*  MCV 92.8 93.6  PLT 133* 148*    Cardiac Enzymes:  Lab 06/29/11 0500  CKTOTAL 1074*  CKMB 95.8*  CKMBINDEX --  TROPONINI 5.53*     BNP: BNP (last 3 results)  Basename 06/29/11 0500  PROBNP 734.7*    ABG No results found for this basename: phart, pco2, pco2art, po2, po2art, hco3, tco2, acidbasedef, o2sat     Other results: VWU:JWJXBJ sinus rhythm, left anterior hemiblock with R greater than S in lead V1 Imaging: Dg Chest 2 View  06/28/2011  *RADIOLOGY REPORT*  Clinical Data: Left-sided chest pain; history of diabetes.  CHEST - 2 VIEW  Comparison: None.  Findings: Mild left basilar opacity, better seen on the lateral view, may reflect atelectasis given elevation of the left hemidiaphragm.  There is no evidence of  pleural effusion or pneumothorax.  The heart is borderline normal in size; the mediastinal contour is within normal limits.  No acute osseous abnormalities are seen.  IMPRESSION: Elevation of the left hemidiaphragm; mild left basilar opacity likely reflects atelectasis, though mild pneumonia could conceivably have a similar appearance.  Original Report Authenticated By: Tonia Ghent, M.D.      Medications:     Scheduled Medications:    . aspirin  324 mg Oral Once  . aspirin EC  81 mg Oral Daily  . carvedilol  6.25 mg Oral BID WC  . furosemide  20 mg Intravenous Once  . glyBURIDE  10 mg Oral BID WC  . heparin  4,000 Units Intravenous Once  . hydrochlorothiazide  12.5  mg Oral Daily  . insulin detemir  15 Units Subcutaneous QHS  . insulin detemir  38 Units Subcutaneous Daily  . levothyroxine  25 mcg Oral QAC breakfast  . lisinopril  10 mg Oral Daily  . morphine      .  morphine injection  4 mg Intravenous Once  . omega-3 acid ethyl esters  1 g Oral TID  . ondansetron      . ondansetron  4 mg Intravenous Once  . pantoprazole  40 mg Oral Q1200  . simvastatin  20 mg Oral QHS  . sodium chloride  3 mL Intravenous Q12H  . DISCONTD: insulin detemir  15-38 Units Subcutaneous QHS  . DISCONTD: lisinopril-hydrochlorothiazide  1 tablet Oral Daily     Infusions:    . heparin 1,000 Units/hr (06/29/11 0600)     PRN Medications:  acetaminophen, HYDROmorphone (DILAUDID) injection, morphine injection, nitroGLYCERIN, ondansetron (ZOFRAN) IV, DISCONTD: ondansetron (ZOFRAN) IV   Assessment:   #1 non-ST elevation myocardial infarction-hemodynamically stable  Possible posterior infarct #2 diabetes mellitus #3 history of hypertension   Plan/Discussion:    #1 patient has a non-ST elevation myocardial infarction suspect he may have a posterior infarction. #2 echocardiogram is pending for today for evaluation of LV function and wall motion abnormalities. #3 patient is hemodynamically stable with no recurrent chest pain and we can proceed with an elective cardiac catheterization tomorrow #4 check hemoglobin A1c #5 start low dose Imdur and continue ACE inhibitor, beta blocker, aspirin and intravenous heparin.  Will hold ACE inhibitor tomorrow prior to cardiac catheterization.patient is not on metformin. #6 we'll discuss with granddaughter today risk and benefits of the cardiac catheterization as outlined below.  I discussed the risks and benefits of a diagnostic cardiac catheterization with the patient.   We also discussed the radial versus femoral approach.  In particular I quoted that the risk of bleeding from the radial artery is usually less than 1%.  I  also explained however that the procedure is more challenging for the cardiologist and does involve a slightly greater radiation exposure although scientist estimate that increased radiation is equivalent to 20 chest x-rays representing only a small risk of the patient.   The following general risks were quoted to the patient for a diagnostic cardiac catheterization:     Length of Stay: 1   Alvin Critchley [facility] 06/29/2011, 9:22 AM

## 2011-06-29 NOTE — Progress Notes (Signed)
  Filed Vitals:   06/29/11 1600  BP: 85/33  Pulse: 61  Temp:   Resp: 17   Pt alert and no c/o pain or discomfort. Pt does not appear to be in discomfort or distress. Spoke with Cardiology NP/PA r/t pt hypotension. Medications reviewed by provider. No new orders given.Will continue to monitor and advise attending as needed.

## 2011-06-29 NOTE — Plan of Care (Signed)
Problem: MI Day 1 - MI Management Goal: Beta-blocker prescribed at discharge Outcome: Progressing Beta blocker ordered

## 2011-06-30 ENCOUNTER — Inpatient Hospital Stay (HOSPITAL_COMMUNITY): Payer: Medicare Other

## 2011-06-30 ENCOUNTER — Other Ambulatory Visit (HOSPITAL_COMMUNITY): Payer: Medicare Other

## 2011-06-30 ENCOUNTER — Encounter (HOSPITAL_COMMUNITY): Payer: Self-pay

## 2011-06-30 DIAGNOSIS — R4182 Altered mental status, unspecified: Secondary | ICD-10-CM

## 2011-06-30 LAB — CBC
HCT: 35.7 % — ABNORMAL LOW (ref 39.0–52.0)
Hemoglobin: 12.4 g/dL — ABNORMAL LOW (ref 13.0–17.0)
MCH: 32.3 pg (ref 26.0–34.0)
MCHC: 34.7 g/dL (ref 30.0–36.0)
MCV: 93 fL (ref 78.0–100.0)
RDW: 13 % (ref 11.5–15.5)

## 2011-06-30 LAB — COMPREHENSIVE METABOLIC PANEL
ALT: 20 U/L (ref 0–53)
AST: 79 U/L — ABNORMAL HIGH (ref 0–37)
CO2: 24 mEq/L (ref 19–32)
Calcium: 9.2 mg/dL (ref 8.4–10.5)
Chloride: 101 mEq/L (ref 96–112)
GFR calc Af Amer: 56 mL/min — ABNORMAL LOW (ref >90)
GFR calc non Af Amer: 49 mL/min — ABNORMAL LOW (ref >90)
Glucose, Bld: 118 mg/dL — ABNORMAL HIGH (ref 70–99)
Sodium: 136 mEq/L (ref 135–145)
Total Bilirubin: 0.4 mg/dL (ref 0.3–1.2)

## 2011-06-30 LAB — AMMONIA: Ammonia: 29 umol/L (ref 11–60)

## 2011-06-30 LAB — GLUCOSE, CAPILLARY
Glucose-Capillary: 51 mg/dL — ABNORMAL LOW (ref 70–99)
Glucose-Capillary: 76 mg/dL (ref 70–99)
Glucose-Capillary: 44 mg/dL — CL (ref 70–99)

## 2011-06-30 LAB — MAGNESIUM: Magnesium: 2.3 mg/dL (ref 1.5–2.5)

## 2011-06-30 LAB — BASIC METABOLIC PANEL
BUN: 29 mg/dL — ABNORMAL HIGH (ref 6–23)
CO2: 23 mEq/L (ref 19–32)
Calcium: 9 mg/dL (ref 8.4–10.5)
Creatinine, Ser: 1.44 mg/dL — ABNORMAL HIGH (ref 0.50–1.35)
GFR calc non Af Amer: 42 mL/min — ABNORMAL LOW (ref >90)
Glucose, Bld: 64 mg/dL — ABNORMAL LOW (ref 70–99)
Sodium: 137 mEq/L (ref 135–145)

## 2011-06-30 LAB — HEPARIN LEVEL (UNFRACTIONATED): Heparin Unfractionated: 0.19 IU/mL — ABNORMAL LOW (ref 0.30–0.70)

## 2011-06-30 MED ORDER — DIAZEPAM 5 MG PO TABS: 5.0000 mg | ORAL_TABLET | ORAL | Status: DC

## 2011-06-30 MED ORDER — GLUCOSE 40 % PO GEL
ORAL | Status: AC
  Filled 2011-06-30: qty 1

## 2011-06-30 MED ORDER — GLUCOSE 40 % PO GEL
ORAL | Status: AC
  Administered 2011-06-30: 37.5 g via ORAL
  Filled 2011-06-30: qty 1

## 2011-06-30 MED ORDER — ASPIRIN 81 MG PO CHEW
324.0000 mg | CHEWABLE_TABLET | ORAL | Status: AC
  Administered 2011-06-30: 324 mg via ORAL

## 2011-06-30 MED ORDER — SODIUM CHLORIDE 0.9 % IV SOLN
1.0000 mL/kg/h | INTRAVENOUS | Status: DC
  Administered 2011-06-30: 1 mL/kg/h via INTRAVENOUS

## 2011-06-30 MED ORDER — DEXTROSE 5 % IV SOLN
INTRAVENOUS | Status: DC
  Administered 2011-06-30: 14:00:00 via INTRAVENOUS

## 2011-06-30 MED ORDER — LORAZEPAM 2 MG/ML IJ SOLN
1.0000 mg | Freq: Once | INTRAMUSCULAR | Status: AC | PRN
  Administered 2011-06-30: 1 mg via INTRAVENOUS

## 2011-06-30 MED ORDER — GLUCOSE 40 % PO GEL
1.0000 | ORAL | Status: DC | PRN
  Administered 2011-06-30: 37.5 g via ORAL

## 2011-06-30 NOTE — Progress Notes (Addendum)
1730 Pts am CBG 51, pt given glucose gel, recheck was 76.  Pt also given 2 orange juices and peanut butter shortly after.  1230 cbg 44, called Rhonda Barrett, PAC - orders rec'd.  Recheck was 71.  Pt receiving D5, and has a lunch tray ordered.  Will recheck cbg in 1 hour.    Eliane Decree, RN  Note timed incorrectly.  Should have been 1730

## 2011-06-30 NOTE — CV Procedure (Signed)
Personally reviewed 2DECHO- official report says no SWMA. As in fact there is hypokinesis of the mid-distal inferior wall and posterior wall. Normal EF Alvin Critchley [facility] 6:48 AM 06/30/2011

## 2011-06-30 NOTE — Progress Notes (Signed)
UR Completed.  Mohamed Portlock Jane [phone] 06/30/2011  

## 2011-06-30 NOTE — Progress Notes (Signed)
Clinical Social Worker received inappropriate referral for advance directives. CSW spoke with RN and verified that patient is not alert and oriented to be able to complete the advance directive packet. CSW will sign off. Please re-consult social work if new needs arises.   Rozetta Nunnery MSW, [employer] [phone]

## 2011-06-30 NOTE — Progress Notes (Signed)
Subjective:  Patient denies any chest pain. No dyspnea. Troponins are significantly elevated.  Dr. Andee Lineman reviewed echo and noted hypokinesis of inferior and posterior wall. EKG today shows bifascicular block but no acute ST-T changes.  Objective:  Vital Signs in the last 24 hours: Temp:  [98 F (36.7 C)-98.7 F (37.1 C)] 98.7 F (37.1 C) (06/17 0400) Pulse Rate:  [59-69] 60  (06/17 0700) Resp:  [14-21] 17  (06/17 0700) BP: (85-120)/(33-50) 117/42 mmHg (06/17 0700) SpO2:  [96 %-99 %] 98 % (06/17 0700) Weight:  [73.4 kg (161 lb 13.1 oz)] 73.4 kg (161 lb 13.1 oz) (06/17 0149)  Intake/Output from previous day: 06/16 0701 - 06/17 0700 In: 766.1 [P.O.:360; I.V.:406.1] Out: 800 [Urine:800] Intake/Output from this shift:       . aspirin  324 mg Oral Once  . aspirin  324 mg Oral Pre-Cath  . aspirin EC  81 mg Oral Daily  . carvedilol  6.25 mg Oral BID WC  . diazepam  5 mg Oral On Call  . glyBURIDE  10 mg Oral BID WC  . hydrochlorothiazide  12.5 mg Oral Daily  . insulin detemir  15 Units Subcutaneous QHS  . insulin detemir  38 Units Subcutaneous Daily  . isosorbide mononitrate  30 mg Oral Daily  . levothyroxine  25 mcg Oral QAC breakfast  . lisinopril  10 mg Oral Daily  . omega-3 acid ethyl esters  1 g Oral TID  . pantoprazole  40 mg Oral Q1200  . simvastatin  20 mg Oral QHS  . sodium chloride  3 mL Intravenous Q12H      . sodium chloride 1 mL/kg/hr (06/30/11 4540)  . sodium chloride    . heparin 950 Units/hr (06/30/11 0700)    Physical Exam: The patient appears to be in no distress. Mentally slow.  Head and neck exam reveals that the pupils are equal and reactive.  The extraocular movements are full.  There is no scleral icterus.  Mouth and pharynx are benign.  No lymphadenopathy.  No carotid bruits.  The jugular venous pressure is normal.  Thyroid is not enlarged or tender.  Chest is clear to percussion and auscultation.  No rales or rhonchi.  Expansion of the chest  is symmetrical.  Heart reveals no abnormal lift or heave.  First and second heart sounds are normal.  There is no murmur gallop rub or click.  The abdomen is soft and nontender.  Bowel sounds are normoactive.  There is no hepatosplenomegaly or mass.  There are no abdominal bruits.  Extremities reveal no phlebitis or edema.  Pedal pulses are good.  There is no cyanosis or clubbing.  Neurologic exam is normal strength and no lateralizing weakness.  No sensory deficits.  Integument reveals no rash  Lab Results:  Basename 06/30/11 0445 06/29/11 0505  WBC 9.1 5.3  HGB 12.4* 12.2*  PLT 143* 133*    Basename 06/29/11 0505 06/29/11 0500  NA 137 137  K 4.1 4.1  CL 105 103  CO2 21 22  GLUCOSE 191* 191*  BUN 23 23  CREATININE 1.09 1.09    Basename 06/29/11 1731 06/29/11 1049  TROPONINI >20.00* 15.31*   Hepatic Function Panel  Basename 06/29/11 0505  PROT 6.6  ALBUMIN 3.3*  AST 43*  ALT 14  ALKPHOS 54  BILITOT 0.4  BILIDIR --  IBILI --    Basename 06/29/11 1049  CHOL 144   No results found for this basename: PROTIME in the last 72 hours  Imaging: Imaging results have been reviewed  Cardiac Studies:  Assessment/Plan:  1. NSTEMI 2. Essential hypertension. 3. Diabetes mellitus  Plan: Cardiac cath today.    LOS: 2 days    Cassell Clement 06/30/2011, 7:43 AM

## 2011-06-30 NOTE — Progress Notes (Signed)
Called by RN reference MS changes. Pt has been cooperative when spoken to but cannot remember instructions and is unable to accurately state where he is. He is alert, pleasant and cooperative but thinks he is in a nursing home (lives alone in a house) and admits that he does not remember (for example) being told to stay in bed unless someone is with him.  On assessment, no focal neurologic deficits are noted. Pt answers questions and follows commands well but does not remember anything told him recently. His blood sugars were low this am, but there was no improvement in his mental status after blood sugar improved.   Pt safety during and after cath as well as apparent change in mental status are very concerning. Family (2 grandchildren) are present and very concerned as well. 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 Spoke with MD, will defer cath till am and ask neuro to see.

## 2011-06-30 NOTE — Consult Note (Signed)
[facility] CONSULT NOTE     Reason for Consult: AMS    HPI:    Levi Robinson is an 78 y.o. male presenting to hospital for 1 week history of intermittent chest discomfort. Cardiology suspects NSTEMI and planning cardiac catheterization. While patient was admitted his mental status changed and neurology was consulted. Per Granddaughter patient was a self sufficient individual prior to hospitalization.  He drove a car (distances varied from 10 -20 minute drives, at times to [place]), did all his billing, cooking and cleaning. He was baseline until 5 PM yesterday when she returned to the room and found his thought process much slower than usual and confused at date and year.   Current labs: LAST CBG:  51, 64, 76, 44, Current 125 Troponin: 15.31  TSH 4.869,  BUN/ Cr: 29/1.44,  HbA1C  8.0  Past Medical History  Diagnosis Date  . Diabetes mellitus   . Hypertension     Past Surgical History  Procedure Date  . Appendectomy     History reviewed. No pertinent family history.  Social History:  reports that he has never smoked. He does not have any smokeless tobacco history on file. He reports that he does not drink alcohol. His drug history not on file.  No Known Allergies  Medications:    Prior to Admission:  Prescriptions prior to admission  Medication Sig Dispense Refill  . aspirin 325 MG tablet Take 325 mg by mouth daily.      . diphenhydrAMINE (BENADRYL) 25 mg capsule Take 25 mg by mouth daily.      Marland Kitchen glyBURIDE (DIABETA) 5 MG tablet Take 10 mg by mouth 2 (two) times daily with a meal.      . insulin detemir (LEVEMIR) 100 UNIT/ML injection Inject 15-38 Units into the skin at bedtime. 15 at bedtime 36-38 in the morning      . levothyroxine (SYNTHROID, LEVOTHROID) 25 MCG tablet Take 25 mcg by mouth daily.      Marland Kitchen lisinopril-hydrochlorothiazide (PRINZIDE,ZESTORETIC) 10-12.5 MG per tablet Take 1 tablet by mouth daily.      . naproxen sodium  (ANAPROX) 220 MG tablet Take 220 mg by mouth 2 (two) times daily with a meal.      . omega-3 acid ethyl esters (LOVAZA) 1 G capsule Take 1 g by mouth 3 (three) times daily.      . pantoprazole (PROTONIX) 40 MG tablet Take 40 mg by mouth daily.      . simvastatin (ZOCOR) 40 MG tablet Take 20 mg by mouth at bedtime.       Scheduled:   . aspirin  324 mg Oral Once  . aspirin  324 mg Oral Pre-Cath  . aspirin EC  81 mg Oral Daily  . carvedilol  6.25 mg Oral BID WC  . diazepam  5 mg Oral On Call  . glyBURIDE  10 mg Oral BID WC  . hydrochlorothiazide  12.5 mg Oral Daily  . insulin detemir  15 Units Subcutaneous QHS  . insulin detemir  38 Units Subcutaneous Daily  . isosorbide mononitrate  30 mg Oral Daily  . levothyroxine  25 mcg Oral QAC breakfast  . lisinopril  10 mg Oral Daily  . omega-3 acid ethyl esters  1 g Oral TID  . pantoprazole  40 mg Oral Q1200  . simvastatin  20 mg Oral QHS  . sodium chloride  3 mL Intravenous Q12H    Review of Systems - General ROS: negative for - chills, fatigue, fever or hot flashes Hematological and Lymphatic ROS: negative for - bruising, fatigue, jaundice or pallor Endocrine ROS: negative for - hair pattern changes, hot flashes, mood swings or skin changes Respiratory ROS: negative for - cough, hemoptysis, orthopnea or wheezing Cardiovascular ROS: negative for - dyspnea on exertion, orthopnea, palpitations or shortness of breath Gastrointestinal ROS: negative for - abdominal pain, appetite loss, blood in stools, diarrhea or hematemesis Musculoskeletal ROS: negative for - joint pain, joint stiffness, joint swelling or muscle pain Neurological ROS: positive for - confusion Dermatological ROS: negative for dry skin, pruritus and rash   Blood pressure 132/57, pulse 61, temperature 99.4 F (37.4 C), temperature source Oral, resp. rate 17, height 5\' 5"  (1.651 m), weight 73.4 kg (161 lb 13.1 oz), SpO2 98.00%.   Neurologic Examination:  Mental Status: Alert,  not oriented to place, date and year, VERY slow mentation and tends to perseverate on what was just said.  He cannot follow simple commands consistently and tends to have a blank stare when asked to do something.  He can repeat what I just asked but then does not initiate the action. When asked to add 25-10-5-1 he continued to repeat the numbers but did not add them.  Speech fluent without evidence of aphasia.  Cranial Nerves: II-Visual fields grossly intact. III/IV/VI-Extraocular movements intact.  Pupils reactive bilaterally. V/VII-Smile symmetric VIII-grossly intact IX/X-normal gag XI-bilateral shoulder shrug XII-midline tongue extension Motor: 5/5 bilaterally with normal tone and bulk Sensory: Pinprick and light touch intact throughout, bilaterally Deep Tendon Reflexes: 2+ and symmetric throughout Plantars downgoing bilaterally Cerebellar: Normal finger-to-nose, normal rapid alternating movements and normal heel-to-shin test.      Lab Results  Component Value Date/Time   CHOL 144 06/29/2011 10:49 AM    Results for orders placed during the hospital encounter of 06/28/11 (from the past 48 hour(s))  CBC     Status: Abnormal   Collection Time   06/28/11 11:01 PM      Component Value Range Comment   WBC 6.2  4.0 - 10.5 K/uL    RBC 4.05 (*) 4.22 - 5.81 MIL/uL    Hemoglobin 13.3  13.0 - 17.0 g/dL    HCT 16.1 (*) 09.6 - 52.0 %    MCV 93.6  78.0 - 100.0 fL    MCH 32.8  26.0 - 34.0 pg    MCHC 35.1  30.0 - 36.0 g/dL    RDW 04.5  40.9 - 81.1 %    Platelets 148 (*) 150 - 400 K/uL   BASIC METABOLIC PANEL     Status: Abnormal   Collection Time   06/28/11 11:01 PM      Component Value Range Comment   Sodium 136  135 - 145 mEq/L    Potassium 3.9  3.5 - 5.1 mEq/L    Chloride 101  96 - 112 mEq/L    CO2 23  19 - 32 mEq/L    Glucose, Bld 245 (*) 70 - 99 mg/dL    BUN 27 (*) 6 - 23 mg/dL    Creatinine, Ser 9.14  0.50 - 1.35 mg/dL    Calcium 9.2  8.4 - 78.2 mg/dL    GFR calc non Af Amer 50  (*) >90 mL/min    GFR calc Af Amer 58 (*) >90 mL/min   POCT I-STAT TROPONIN I     Status: Abnormal   Collection Time  06/28/11 11:32 PM      Component Value Range Comment   Troponin i, poc 0.28 (*) 0.00 - 0.08 ng/mL    Comment NOTIFIED PHYSICIAN      Comment 3            MRSA PCR SCREENING     Status: Normal   Collection Time   06/29/11  1:20 AM      Component Value Range Comment   MRSA by PCR NEGATIVE  NEGATIVE   GLUCOSE, CAPILLARY     Status: Abnormal   Collection Time   06/29/11  1:31 AM      Component Value Range Comment   Glucose-Capillary 219 (*) 70 - 99 mg/dL   CARDIAC PANEL(CRET KIN+CKTOT+MB+TROPI)     Status: Abnormal   Collection Time   06/29/11  5:00 AM      Component Value Range Comment   Total CK 1074 (*) 7 - 232 U/L    CK, MB 95.8 (*) 0.3 - 4.0 ng/mL    Troponin I 5.53 (*) <0.30 ng/mL    Relative Index 8.9 (*) 0.0 - 2.5   PRO B NATRIURETIC PEPTIDE     Status: Abnormal   Collection Time   06/29/11  5:00 AM      Component Value Range Comment   Pro B Natriuretic peptide (BNP) 734.7 (*) 0 - 450 pg/mL   BASIC METABOLIC PANEL     Status: Abnormal   Collection Time   06/29/11  5:00 AM      Component Value Range Comment   Sodium 137  135 - 145 mEq/L    Potassium 4.1  3.5 - 5.1 mEq/L    Chloride 103  96 - 112 mEq/L    CO2 22  19 - 32 mEq/L    Glucose, Bld 191 (*) 70 - 99 mg/dL    BUN 23  6 - 23 mg/dL    Creatinine, Ser 1.61  0.50 - 1.35 mg/dL    Calcium 8.8  8.4 - 09.6 mg/dL    GFR calc non Af Amer 59 (*) >90 mL/min    GFR calc Af Amer 68 (*) >90 mL/min   PROTIME-INR     Status: Normal   Collection Time   06/29/11  5:05 AM      Component Value Range Comment   Prothrombin Time 15.1  11.6 - 15.2 seconds    INR 1.17  0.00 - 1.49   APTT     Status: Abnormal   Collection Time   06/29/11  5:05 AM      Component Value Range Comment   aPTT >200 (*) 24 - 37 seconds   CBC     Status: Abnormal   Collection Time   06/29/11  5:05 AM      Component Value Range Comment    WBC 5.3  4.0 - 10.5 K/uL    RBC 3.88 (*) 4.22 - 5.81 MIL/uL    Hemoglobin 12.2 (*) 13.0 - 17.0 g/dL    HCT 04.5 (*) 40.9 - 52.0 %    MCV 92.8  78.0 - 100.0 fL    MCH 31.4  26.0 - 34.0 pg    MCHC 33.9  30.0 - 36.0 g/dL    RDW 81.1  91.4 - 78.2 %    Platelets 133 (*) 150 - 400 K/uL   DIFFERENTIAL     Status: Normal   Collection Time   06/29/11  5:05 AM      Component Value Range Comment  Neutrophils Relative 70  43 - 77 %    Neutro Abs 3.7  1.7 - 7.7 K/uL    Lymphocytes Relative 21  12 - 46 %    Lymphs Abs 1.1  0.7 - 4.0 K/uL    Monocytes Relative 7  3 - 12 %    Monocytes Absolute 0.4  0.1 - 1.0 K/uL    Eosinophils Relative 1  0 - 5 %    Eosinophils Absolute 0.1  0.0 - 0.7 K/uL    Basophils Relative 1  0 - 1 %    Basophils Absolute 0.0  0.0 - 0.1 K/uL   TSH     Status: Abnormal   Collection Time   06/29/11  5:05 AM      Component Value Range Comment   TSH 4.869 (*) 0.350 - 4.500 uIU/mL   COMPREHENSIVE METABOLIC PANEL     Status: Abnormal   Collection Time   06/29/11  5:05 AM      Component Value Range Comment   Sodium 137  135 - 145 mEq/L    Potassium 4.1  3.5 - 5.1 mEq/L    Chloride 105  96 - 112 mEq/L    CO2 21  19 - 32 mEq/L    Glucose, Bld 191 (*) 70 - 99 mg/dL    BUN 23  6 - 23 mg/dL    Creatinine, Ser 9.14  0.50 - 1.35 mg/dL    Calcium 8.8  8.4 - 78.2 mg/dL    Total Protein 6.6  6.0 - 8.3 g/dL    Albumin 3.3 (*) 3.5 - 5.2 g/dL    AST 43 (*) 0 - 37 U/L    ALT 14  0 - 53 U/L    Alkaline Phosphatase 54  39 - 117 U/L    Total Bilirubin 0.4  0.3 - 1.2 mg/dL    GFR calc non Af Amer 59 (*) >90 mL/min    GFR calc Af Amer 68 (*) >90 mL/min   MAGNESIUM     Status: Normal   Collection Time   06/29/11  5:05 AM      Component Value Range Comment   Magnesium 2.2  1.5 - 2.5 mg/dL   HEMOGLOBIN N5A     Status: Abnormal   Collection Time   06/29/11  5:05 AM      Component Value Range Comment   Hemoglobin A1C 8.0 (*) <5.7 %    Mean Plasma Glucose 183 (*) <117 mg/dL   LIPID  PANEL     Status: Normal   Collection Time   06/29/11  5:05 AM      Component Value Range Comment   Cholesterol 139  0 - 200 mg/dL    Triglycerides 29  <213 mg/dL    HDL 58  >08 mg/dL    Total CHOL/HDL Ratio 2.4      VLDL 6  0 - 40 mg/dL    LDL Cholesterol 75  0 - 99 mg/dL   GLUCOSE, CAPILLARY     Status: Abnormal   Collection Time   06/29/11  7:20 AM      Component Value Range Comment   Glucose-Capillary 165 (*) 70 - 99 mg/dL   HEPARIN LEVEL (UNFRACTIONATED)     Status: Abnormal   Collection Time   06/29/11  8:50 AM      Component Value Range Comment   Heparin Unfractionated 0.90 (*) 0.30 - 0.70 IU/mL   CARDIAC PANEL(CRET KIN+CKTOT+MB+TROPI)     Status: Abnormal  Collection Time   06/29/11 10:49 AM      Component Value Range Comment   Total CK 1978 (*) 7 - 232 U/L    CK, MB 162.2 (*) 0.3 - 4.0 ng/mL CRITICAL VALUE NOTED.  VALUE IS CONSISTENT WITH PREVIOUSLY REPORTED AND CALLED VALUE.   Troponin I 15.31 (*) <0.30 ng/mL    Relative Index 8.2 (*) 0.0 - 2.5   LIPID PANEL     Status: Normal   Collection Time   06/29/11 10:49 AM      Component Value Range Comment   Cholesterol 144  0 - 200 mg/dL    Triglycerides 35  <409 mg/dL    HDL 62  >81 mg/dL    Total CHOL/HDL Ratio 2.3      VLDL 7  0 - 40 mg/dL    LDL Cholesterol 75  0 - 99 mg/dL   GLUCOSE, CAPILLARY     Status: Abnormal   Collection Time   06/29/11 12:13 PM      Component Value Range Comment   Glucose-Capillary 246 (*) 70 - 99 mg/dL    Comment 1 Notify RN      Comment 2 Documented in Chart     CARDIAC PANEL(CRET KIN+CKTOT+MB+TROPI)     Status: Abnormal   Collection Time   06/29/11  5:31 PM      Component Value Range Comment   Total CK 2295 (*) 7 - 232 U/L    CK, MB 137.8 (*) 0.3 - 4.0 ng/mL CRITICAL VALUE NOTED.  VALUE IS CONSISTENT WITH PREVIOUSLY REPORTED AND CALLED VALUE.   Troponin I >20.00 (*) <0.30 ng/mL    Relative Index 6.0 (*) 0.0 - 2.5   HEPARIN LEVEL (UNFRACTIONATED)     Status: Normal   Collection Time    06/29/11  5:31 PM      Component Value Range Comment   Heparin Unfractionated 0.53  0.30 - 0.70 IU/mL   GLUCOSE, CAPILLARY     Status: Abnormal   Collection Time   06/29/11  9:22 PM      Component Value Range Comment   Glucose-Capillary 127 (*) 70 - 99 mg/dL   HEPARIN LEVEL (UNFRACTIONATED)     Status: Abnormal   Collection Time   06/30/11  4:45 AM      Component Value Range Comment   Heparin Unfractionated 0.19 (*) 0.30 - 0.70 IU/mL   CBC     Status: Abnormal   Collection Time   06/30/11  4:45 AM      Component Value Range Comment   WBC 9.1  4.0 - 10.5 K/uL    RBC 3.84 (*) 4.22 - 5.81 MIL/uL    Hemoglobin 12.4 (*) 13.0 - 17.0 g/dL    HCT 19.1 (*) 47.8 - 52.0 %    MCV 93.0  78.0 - 100.0 fL    MCH 32.3  26.0 - 34.0 pg    MCHC 34.7  30.0 - 36.0 g/dL    RDW 29.5  62.1 - 30.8 %    Platelets 143 (*) 150 - 400 K/uL   GLUCOSE, CAPILLARY     Status: Abnormal   Collection Time   06/30/11  8:23 AM      Component Value Range Comment   Glucose-Capillary 51 (*) 70 - 99 mg/dL   BASIC METABOLIC PANEL     Status: Abnormal   Collection Time   06/30/11  9:10 AM      Component Value Range Comment   Sodium 137  135 -  145 mEq/L    Potassium 3.6  3.5 - 5.1 mEq/L    Chloride 101  96 - 112 mEq/L    CO2 23  19 - 32 mEq/L    Glucose, Bld 64 (*) 70 - 99 mg/dL    BUN 29 (*) 6 - 23 mg/dL    Creatinine, Ser 1.61 (*) 0.50 - 1.35 mg/dL    Calcium 9.0  8.4 - 09.6 mg/dL    GFR calc non Af Amer 42 (*) >90 mL/min    GFR calc Af Amer 49 (*) >90 mL/min   GLUCOSE, CAPILLARY     Status: Normal   Collection Time   06/30/11 10:57 AM      Component Value Range Comment   Glucose-Capillary 76  70 - 99 mg/dL     Dg Chest 2 View  0/45/4098  *RADIOLOGY REPORT*  Clinical Data: Left-sided chest pain; history of diabetes.  CHEST - 2 VIEW  Comparison: None.  Findings: Mild left basilar opacity, better seen on the lateral view, may reflect atelectasis given elevation of the left hemidiaphragm.  There is no evidence of  pleural effusion or pneumothorax.  The heart is borderline normal in size; the mediastinal contour is within normal limits.  No acute osseous abnormalities are seen.  IMPRESSION: Elevation of the left hemidiaphragm; mild left basilar opacity likely reflects atelectasis, though mild pneumonia could conceivably have a similar appearance.  Original Report Authenticated By: Tonia Ghent, M.D.     Assessment/Plan:    78 YO male who, per family that lives next to him, was self sufficient and able to take care of all ADLS prior to hospitalization.  Patient was at baseline Saturday AM however that evening he was noted to become significantly confused and very slow with mentation. Early dementia is suspected, with exacerbation since admission secondary to displacement from usual familiar surroundings and regular routines, as well as acute medical illness.   Recommend: 1) MRI brain W/O contrast 2) B12, TSH, Folate, RPR, Ammonia, Magnesium 3) EEG  Felicie Morn PA-C [facility] [phone]  Case was discussed with Dr. Roseanne Reno who agrees with assessment and recommendations. We will continue to see in followup.  06/30/2011, 12:28 PM

## 2011-06-30 NOTE — Progress Notes (Signed)
ANTICOAGULATION CONSULT NOTE - Follow Up Consult  Pharmacy Consult for heparin Indication: chest pain/ACS  Labs:  Basename 06/30/11 1315 06/30/11 0910 06/30/11 0445 06/29/11 1731 06/29/11 1049 06/29/11 0505 06/29/11 0500 06/28/11 2301  HGB -- -- 12.4* -- -- 12.2* -- --  HCT -- -- 35.7* -- -- 36.0* -- 37.9*  PLT -- -- 143* -- -- 133* -- 148*  APTT -- -- -- -- -- >200* -- --  LABPROT -- -- -- -- -- 15.1 -- --  INR -- -- -- -- -- 1.17 -- --  HEPARINUNFRC 0.34 -- 0.19* 0.53 -- -- -- --  CREATININE -- 1.44* -- -- -- 1.09 1.09 --  CKTOTAL -- -- -- 2295* 1978* -- 1074* --  CKMB -- -- -- 137.8* 162.2* -- 95.8* --  TROPONINI -- -- -- >20.00* 15.31* -- 5.53* --    Assessment: 78 yo male now at goal on heparin after one level at goal with current rate. Noted plans to defer cath (due to AMS) until 6/18.  Goal of Therapy:  Heparin level 0.3-0.7 units/ml Platelet monitoring per protocol: yes   Plan:  -No heparin changes -CBC and heparin level daily  Benny Lennert PharmD BCPS 06/30/2011,2:18 PM

## 2011-06-30 NOTE — Progress Notes (Signed)
Pt in MRI, rec'd order for one time dose of ativan for anxiety.  Pt rec'd 1mg  IV ativan, pt got very agitated, borderline combative, attempting to fight the nurses to get off the mri stretcher.  Listed Ativan as intolerance in pt allergy.    Eliane Decree, RN

## 2011-06-30 NOTE — Progress Notes (Signed)
ANTICOAGULATION CONSULT NOTE - Follow Up Consult  Pharmacy Consult for heparin Indication: chest pain/ACS  Labs:  Basename 06/30/11 0445 06/29/11 1731 06/29/11 1049 06/29/11 0850 06/29/11 0505 06/29/11 0500 06/28/11 2301  HGB 12.4* -- -- -- 12.2* -- --  HCT 35.7* -- -- -- 36.0* -- 37.9*  PLT 143* -- -- -- 133* -- 148*  APTT -- -- -- -- >200* -- --  LABPROT -- -- -- -- 15.1 -- --  INR -- -- -- -- 1.17 -- --  HEPARINUNFRC 0.19* 0.53 -- 0.90* -- -- --  CREATININE -- -- -- -- 1.09 1.09 1.25  CKTOTAL -- 2295* 1978* -- -- 1074* --  CKMB -- 137.8* 162.2* -- -- 95.8* --  TROPONINI -- >20.00* 15.31* -- -- 5.53* --    Assessment: 78yo male now subtherapeutic on heparin after one level at goal with current rate.  Goal of Therapy:  Heparin level 0.3-0.7 units/ml   Plan:  Will increase heparin by 1-2 units/kg/hr to 950 units/hr and check level in 8hr vs f/u after cath.  Colleen Can PharmD BCPS 06/30/2011,6:09 AM

## 2011-06-30 NOTE — Progress Notes (Signed)
Pt remains very lethargic this evening.  Difficult to arouse but will open eyes to sternal pressure and loud voice.  Mumbles"  No"  in response to questions of pain.  Oriented to self only.  MAE weakly.  Having periods of  Sleep apnea throughout the evening.  Levemir held tonight per PA Arguello  with pt not awake enough to eat at this point.  Trial of sips of water failed.   CBG's changed to q 4 hrs.   Jules Schick, RN

## 2011-06-30 NOTE — Progress Notes (Addendum)
Inpatient Diabetes Program Recommendations  AACE/ADA: New Consensus Statement on Inpatient Glycemic Control (2009)  Target Ranges:  Prepandial:   less than 140 mg/dL      Peak postprandial:   less than 180 mg/dL (1-2 hours)      Critically ill patients:  140 - 180 mg/dL   Reason for assessment: persistent hypoglycemia.    Inpatient Diabetes Program Recommendations: Decrease Levemir insulin by 1/2 Discontinue Glyburide as orals are not recommended during hospitalization  Will follow.  Thank you  Piedad Climes [facility] Inpatient Diabetes Coordinator [phone]

## 2011-07-01 ENCOUNTER — Encounter (HOSPITAL_COMMUNITY)
Admission: AD | Disposition: A | Discharge status: Home / Self Care | Payer: Self-pay | Source: Home / Self Care | Attending: Cardiology

## 2011-07-01 ENCOUNTER — Inpatient Hospital Stay (HOSPITAL_COMMUNITY): Payer: Medicare Other

## 2011-07-01 DIAGNOSIS — R569 Unspecified convulsions: Secondary | ICD-10-CM

## 2011-07-01 DIAGNOSIS — I2510 Atherosclerotic heart disease of native coronary artery without angina pectoris: Secondary | ICD-10-CM

## 2011-07-01 DIAGNOSIS — R4182 Altered mental status, unspecified: Secondary | ICD-10-CM

## 2011-07-01 HISTORY — PX: LEFT HEART CATHETERIZATION WITH CORONARY ANGIOGRAM: SHX5451

## 2011-07-01 LAB — CBC
HCT: 33 % — ABNORMAL LOW (ref 39.0–52.0)
Hemoglobin: 11.4 g/dL — ABNORMAL LOW (ref 13.0–17.0)
MCH: 32.6 pg (ref 26.0–34.0)
MCHC: 34.5 g/dL (ref 30.0–36.0)
MCV: 94.3 fL (ref 78.0–100.0)
RDW: 13.1 % (ref 11.5–15.5)

## 2011-07-01 LAB — BASIC METABOLIC PANEL
CO2: 25 mEq/L (ref 19–32)
Chloride: 103 mEq/L (ref 96–112)
Creatinine, Ser: 1.26 mg/dL (ref 0.50–1.35)

## 2011-07-01 LAB — GLUCOSE, CAPILLARY
Glucose-Capillary: 148 mg/dL — ABNORMAL HIGH (ref 70–99)
Glucose-Capillary: 125 mg/dL — ABNORMAL HIGH (ref 70–99)
Glucose-Capillary: 136 mg/dL — ABNORMAL HIGH (ref 70–99)

## 2011-07-01 LAB — CARDIAC PANEL(CRET KIN+CKTOT+MB+TROPI)
Relative Index: 1.2 (ref 0.0–2.5)
Total CK: 477 U/L — ABNORMAL HIGH (ref 7–232)
Troponin I: 3.64 ng/mL (ref <0.30)

## 2011-07-01 LAB — HEPARIN LEVEL (UNFRACTIONATED): Heparin Unfractionated: 0.52 IU/mL (ref 0.30–0.70)

## 2011-07-01 SURGERY — LEFT HEART CATHETERIZATION WITH CORONARY ANGIOGRAM
Anesthesia: LOCAL

## 2011-07-01 MED ORDER — SODIUM CHLORIDE 0.9 % IV SOLN
INTRAVENOUS | Status: AC
  Administered 2011-07-01: 16:00:00 via INTRAVENOUS

## 2011-07-01 MED ORDER — HEPARIN (PORCINE) IN NACL 100-0.45 UNIT/ML-% IJ SOLN
900.0000 [IU]/h | INTRAMUSCULAR | Status: DC
  Administered 2011-07-01: 900 [IU]/h via INTRAVENOUS
  Filled 2011-07-01: qty 250

## 2011-07-01 MED ORDER — VERAPAMIL HCL 2.5 MG/ML IV SOLN
INTRAVENOUS | Status: AC
  Filled 2011-07-01: qty 2

## 2011-07-01 MED ORDER — INSULIN DETEMIR 100 UNIT/ML ~~LOC~~ SOLN
20.0000 [IU] | Freq: Every day | SUBCUTANEOUS | Status: DC
  Administered 2011-07-02 – 2011-07-04 (×3): 20 [IU] via SUBCUTANEOUS

## 2011-07-01 MED ORDER — HEPARIN (PORCINE) IN NACL 2-0.9 UNIT/ML-% IJ SOLN
INTRAMUSCULAR | Status: AC
  Filled 2011-07-01: qty 2000

## 2011-07-01 MED ORDER — HEPARIN SODIUM (PORCINE) 1000 UNIT/ML IJ SOLN
INTRAMUSCULAR | Status: AC
  Filled 2011-07-01: qty 1

## 2011-07-01 MED ORDER — MAGNESIUM HYDROXIDE NICU ORAL SYRINGE 400 MG/5 ML: 30.0000 mL | Freq: Every day | ORAL | Status: DC | PRN

## 2011-07-01 MED ORDER — NITROGLYCERIN 0.2 MG/ML ON CALL CATH LAB
INTRAVENOUS | Status: AC
  Filled 2011-07-01: qty 1

## 2011-07-01 MED ORDER — LIDOCAINE HCL (PF) 1 % IJ SOLN
INTRAMUSCULAR | Status: AC
  Filled 2011-07-01: qty 30

## 2011-07-01 MED ORDER — CLOPIDOGREL BISULFATE 300 MG PO TABS
ORAL_TABLET | ORAL | Status: AC
  Filled 2011-07-01: qty 1

## 2011-07-01 MED ORDER — MAGNESIUM HYDROXIDE 400 MG/5ML PO SUSP
30.0000 mL | Freq: Every day | ORAL | Status: DC | PRN
  Administered 2011-07-01 – 2011-07-02 (×2): 30 mL via ORAL
  Filled 2011-07-01 (×2): qty 30

## 2011-07-01 NOTE — Progress Notes (Signed)
Patient is entirely confused about all of this.  He is not sure of the status of his family---"they are not here".  Spoke with nurse.  Dr. Andee Lineman spoke with her about the procedure apparently, but I will meet with both of them prior to proceeding with cath to make sure everyone is on the same page.  She will be here around 9 am.   Bonnee Quin

## 2011-07-01 NOTE — Progress Notes (Signed)
ANTICOAGULATION CONSULT NOTE - Initial Consult  Pharmacy Consult for heparin Indication: NTSEMI s/p cath; plan PCI if mental status improves  Allergies  Allergen Reactions  . Ativan (Lorazepam) Other (See Comments)    agitation    Patient Measurements: Height: 5\' 5"  (165.1 cm) Weight: 160 lb 15 oz (73 kg) IBW/kg (Calculated) : 61.5  Heparin Dosing Weight: 73kg  Vital Signs: Temp: 97.8 F (36.6 C) (06/18 1200) Temp src: Oral (06/18 1200) BP: 101/42 mmHg (06/18 1300) Pulse Rate: 59  (06/18 1343)  Labs:  Basename 07/01/11 1011 07/01/11 0500 06/30/11 1412 06/30/11 1315 06/30/11 0910 06/30/11 0445 06/29/11 1731 06/29/11 1049 06/29/11 0505 06/29/11 0500  HGB -- 11.4* -- -- -- 12.4* -- -- -- --  HCT -- 33.0* -- -- -- 35.7* -- -- 36.0* --  PLT -- 127* -- -- -- 143* -- -- 133* --  APTT -- -- -- -- -- -- -- -- >200* --  LABPROT -- -- -- -- -- -- -- -- 15.1 --  INR -- -- -- -- -- -- -- -- 1.17 --  HEPARINUNFRC -- 0.52 -- 0.34 -- 0.19* -- -- -- --  CREATININE 1.26 -- 1.28 -- 1.44* -- -- -- -- --  CKTOTAL -- -- -- -- -- -- 2295* 1978* -- 1074*  CKMB -- -- -- -- -- -- 137.8* 162.2* -- 95.8*  TROPONINI -- -- -- -- -- -- >20.00* 15.31* -- 5.53*    Estimated Creatinine Clearance: 35.9 ml/min (by C-G formula based on Cr of 1.26).   Medical History: Past Medical History  Diagnosis Date  . Hypertension   . Diabetes mellitus     Medications:  Scheduled:    . aspirin  324 mg Oral Once  . aspirin  324 mg Oral Pre-Cath  . aspirin EC  81 mg Oral Daily  . carvedilol  6.25 mg Oral BID WC  . clopidogrel      . heparin      . heparin      . hydrochlorothiazide  12.5 mg Oral Daily  . insulin detemir  20 Units Subcutaneous Daily  . isosorbide mononitrate  30 mg Oral Daily  . levothyroxine  25 mcg Oral QAC breakfast  . lidocaine      . lisinopril  10 mg Oral Daily  . nitroGLYCERIN      . omega-3 acid ethyl esters  1 g Oral TID  . pantoprazole  40 mg Oral Q1200  . simvastatin  20  mg Oral QHS  . sodium chloride  3 mL Intravenous Q12H  . verapamil      . DISCONTD: diazepam  5 mg Oral On Call  . DISCONTD: glyBURIDE  10 mg Oral BID WC  . DISCONTD: insulin detemir  15 Units Subcutaneous QHS  . DISCONTD: insulin detemir  38 Units Subcutaneous Daily   Infusions:    . sodium chloride 10 mL/hr at 07/01/11 0000  . sodium chloride    . dextrose 10 mL/hr at 06/30/11 1330  . DISCONTD: sodium chloride 1 mL/kg/hr (07/01/11 0413)  . DISCONTD: heparin 950 Units/hr (06/30/11 2200)    Assessment: 78 yo male with NSTEMI s/p cath will be restarted on heparin 6 hours after sheath is removed. Radial cath per RN was removed at 1442.  Plan PCI 06/20 per MD if mental status improves Goal of Therapy:  Heparin level 0.3-0.5 units/ml Monitor platelets by anticoagulation protocol: Yes   Plan:  1) Restart heparin at 900 units/hr at 2045 tonight. No bolus. 2) Check  an 8 hour heparin level after drip is restarted. 3) Daily heparin level and CBC  Chanel Mcadams, Tsz-Yin 07/01/2011,3:53 PM

## 2011-07-01 NOTE — Care Management Note (Signed)
    Page 1 of 1   07/07/2011     4:24:47 PM   CARE MANAGEMENT NOTE 07/07/2011  Patient:  Levi Robinson,Levi Robinson   Account Number:  [id number]  Date Initiated:  06/30/2011  Documentation initiated by:  Avie Arenas  Subjective/Objective Assessment:   STEMI.  Lives alone.     Action/Plan:   Anticipated DC Date:  07/04/2011   Anticipated DC Plan:  HOME W HOME HEALTH SERVICES      DC Planning Services  CM consult      [facility] Choice  HOME HEALTH   Choice offered to / List presented to:  C-2 HC POA / Guardian        HH arranged  HH-1 RN      [facility] agency  [facility]   Status of service:  Completed, signed off Medicare Important Message given?   (If response is "NO", the following Medicare IM given date fields will be blank) Date Medicare IM given:   Date Additional Medicare IM given:    Discharge Disposition:  HOME W HOME HEALTH SERVICES  Per UR Regulation:  Reviewed for med. necessity/level of care/duration of stay  If discussed at Long Length of Stay Meetings, dates discussed:    Comments:  PCP Dr. Nicholos Johns uses [facility] on [address] [facility] -  POA - Grandaughter Levi Robinson [phone]  6/24 16:30 debbie Keeana Pieratt rn,bsn spoke w Levi Robinson. went over hhc list. no pref. ref to [facility] since they have Psychologist, prison and probation services. ref to [facility] w [facility] for hhrn.  6/24 10am debbie Curry Seefeldt rn.bsn spoke w pt. he wants to ck w granda he thinks hhc was arranged but will have granda call me.  07-01-11 11:30am Avie Arenas, RNBSN - [phone] Does live alone and is independent - grandaughter lives next to him.  Agreeable to [facility] RN on discharge for f/u. List of HH agencies provided - Will continue to follow for needs.   Will need orders for [facility].

## 2011-07-01 NOTE — Progress Notes (Signed)
Patient is stable.  I met with his granddaughter who has health care POA.  His two children are deceased.  Neuro also stopped by  (Dr. Stewart).  He said he was cleared for cath.  The patient is oriented to time, place, etc today, and his grandaughter and I walked through the risks and benefits with the patient.  After thorough discussion, he wishes to proceed as outlined by Dr. Brackbill.   Will plan to move forward this pm.  Risks reviewed in detail, and his grand daughter will sign the consent.   

## 2011-07-01 NOTE — Progress Notes (Signed)
Subjective: No complaints. No recurrence of confusion.  Objective: Current vital signs: BP 106/47  Pulse 58  Temp 97.8 F (36.6 C) (Oral)  Resp 18  Ht 5\' 5"  (1.651 m)  Wt 73 kg (160 lb 15 oz)  BMI 26.78 kg/m2  SpO2 98%  Neurologic Exam: Alert and in no acute distress. Patient was oriented x3. Mental status was normal. Speech was normal with no indications of receptive or expressive aphasia.  Lab Results: Vitamin B12 and folate levels were normal. TSH was normal. RPR was nonreactive.  EEG: Study completed and results are pending.  Studies/Results: Mr Sherrin Daisy Contrast  07/01/2011  *RADIOLOGY REPORT*  Clinical Data: New onset of altered mental status.  MRI HEAD WITHOUT CONTRAST  Technique:  Multiplanar, multiecho pulse sequences of the brain and surrounding structures were obtained according to standard protocol without intravenous contrast.  Comparison: None.  Findings: The study is mildly degraded by patient motion.  Per the technologist notes, the patient was given ativan, but had an adverse reaction and became more agitated.  Moderate generalized atrophy is present.  Diffuse periventricular subcortical white matter changes are present bilaterally.  There is no flow signal in the right internal carotid artery below the ophthalmic segment.  Flow is evident within the right MCA.  Flow is present in the other intracranial arteries. No acute infarct, hemorrhage, mass lesion is present.  The ventricles are proportionate to the degree of atrophy.  No significant extra-axial fluid collections are present.  The patient is status post bilateral lens extractions.  The globes and orbits are otherwise intact.  The paranasal sinuses and mastoid air cells are clear.  IMPRESSION:  1.  No acute or focal abnormality to explain the patient's symptoms. 2.  Atrophy and extensive white matter disease.  This likely reflects the sequelae of chronic microvascular ischemia. 3.  The right internal carotid artery  is occluded with flow evident above the level of the ophthalmic artery.  Original Report Authenticated By: Jamesetta Orleans. MATTERN, M.D.    Medications:  Scheduled:   . aspirin  324 mg Oral Once  . aspirin EC  81 mg Oral Daily  . carvedilol  6.25 mg Oral BID WC  . diazepam  5 mg Oral On Call  . hydrochlorothiazide  12.5 mg Oral Daily  . insulin detemir  20 Units Subcutaneous Daily  . isosorbide mononitrate  30 mg Oral Daily  . levothyroxine  25 mcg Oral QAC breakfast  . lisinopril  10 mg Oral Daily  . omega-3 acid ethyl esters  1 g Oral TID  . pantoprazole  40 mg Oral Q1200  . simvastatin  20 mg Oral QHS  . sodium chloride  3 mL Intravenous Q12H  . DISCONTD: glyBURIDE  10 mg Oral BID WC  . DISCONTD: insulin detemir  15 Units Subcutaneous QHS  . DISCONTD: insulin detemir  38 Units Subcutaneous Daily   ZOX:[id number], dextrose, HYDROmorphone (DILAUDID) injection, LORazepam, morphine injection, nitroGLYCERIN, ondansetron (ZOFRAN) IV  Assessment/Plan: 1. Transient mental status changes with confusion, most likely secondary to prolonged effects of hypoglycemia, resolved. 2. No clinical signs of acute focal or diffuse central nervous system abnormality including no no acute stroke. 3. No evidence of dementia.  Recommendations: No further neurological intervention is indicated at this point. We will gladly see him for followup evaluation if indicated. No contraindications to planned interventional cardiac procedures.  C.R. Roseanne Reno, MD [facility] [phone]  07/01/2011  10:42 AM

## 2011-07-01 NOTE — CV Procedure (Signed)
   Cardiac Catheterization Procedure Note  Name: Levi Robinson MRN: [id number] DOB: [birth year]  Procedure: Left Heart Cath, Selective Coronary Angiography, LV angiography  Indication: NonSTEMI with marked enzyme elevation yesterday.     Procedural Details: The left wrist was prepped, draped, and anesthetized with 1% lidocaine. Using the modified Seldinger technique, a 5 French sheath was introduced into the left radial artery. 3 mg of verapamil was administered through the sheath, weight-based unfractionated heparin was administered intravenously. Standard Judkins catheters were used for selective coronary angiography and left ventriculography. Catheter exchanges were performed over an exchange length guidewire. There were no immediate procedural complications. A TR band was used for radial hemostasis at the completion of the procedure.  The patient was transferred to the post catheterization recovery area for further monitoring.  The patient remained confused and was moving about somewhat during the procedure.  Because of the issues yesterday with MRI, we elected not to sedate him.  Overall he tolerated well, but it was difficult to keep him from moving during the procedure, both legs, hands and arms.    Procedural Findings: Hemodynamics: AO 97/51 (70) LV 106/20  Coronary angiography: Coronary dominance: right  Left mainstem: The vessel is heavily calcified, but without obstruction.  Left anterior descending (LAD): The vessel is also heavily calcified.  There is moderate bifurcation plaque at the takeoff of the second diagonal.  More distally, there is a 60% eccentric area of narrowing that does not appear flow limiting.  The large D2 also has some ostial narrowing that does not appear critical.    Left circumflex (LCx): The CFX has a short common trunk then divides to a large OM1 and AV circumflex.  Just after the OM there is a calcified high grade stenosis leading to a large OM vessel.   There is some plaquing of 50% in the mid OM1 and the 95 % lesion in the AV circ is calcified, and abuts the OM1.  The distal AV circ is quite large.    Right coronary artery (RCA): The vessel is calcified and supplies a large PD and PL branch.  There is calcification throughout without critical narrowing.  The PDA has some mild ostial narrowing.    Left ventriculography: Left ventricular systolic function is normal, LVEF is estimated at 55-65%, there is no significant mitral regurgitation   Final Conclusions:   1.  High grade stenosis of the AV circ which appears to be the culprit with complex bifurcation disease 2.  Continued confusion in the laboratory 3.  Preserved LV function  Recommendations:  1.  Despite elevated enzymes, he has a normal LV and severe stenosis.  We will load with plavix and if his mental status improves, consider PCI when it is safe.  He is better from yesterday, but still difficult to control on the table.  We will resume heparin and plan PCI Thursday if he improves mental status wise.    [name] 07/01/2011, 3:15 PM

## 2011-07-01 NOTE — H&P (View-Only) (Signed)
Patient is stable.  I met with his granddaughter who has health care POA.  His two children are deceased.  Neuro also stopped by  (Dr. Roseanne Reno).  He said he was cleared for cath.  The patient is oriented to time, place, etc today, and his grandaughter and I walked through the risks and benefits with the patient.  After thorough discussion, he wishes to proceed as outlined by Dr. Patty Sermons.   Will plan to move forward this pm.  Risks reviewed in detail, and his grand daughter will sign the consent.

## 2011-07-01 NOTE — Progress Notes (Addendum)
Subjective:  This morning the patient is much improved yesterday in terms of mental function.  Confusion yesterday probably secondary to hypoglycemia.  Had MRI last pm, results pending. Denies chest pain or dyspnea. Rhythm stable NSR.  Objective:  Vital Signs in the last 24 hours: Temp:  [96.8 F (36 C)-98.5 F (36.9 C)] 98.5 F (36.9 C) (06/18 0400) Pulse Rate:  [56-66] 56  (06/18 0700) Resp:  [12-21] 12  (06/18 0700) BP: (88-132)/(37-63) 104/44 mmHg (06/18 0700) SpO2:  [95 %-99 %] 96 % (06/18 0700) Weight:  [73 kg (160 lb 15 oz)] 73 kg (160 lb 15 oz) (06/18 0400)  Intake/Output from previous day: 06/17 0701 - 06/18 0700 In: 946.2 [I.V.:946.2] Out: 1650 [Urine:1650] Intake/Output from this shift:       . aspirin  324 mg Oral Once  . aspirin EC  81 mg Oral Daily  . carvedilol  6.25 mg Oral BID WC  . diazepam  5 mg Oral On Call  . hydrochlorothiazide  12.5 mg Oral Daily  . insulin detemir  38 Units Subcutaneous Daily  . isosorbide mononitrate  30 mg Oral Daily  . levothyroxine  25 mcg Oral QAC breakfast  . lisinopril  10 mg Oral Daily  . omega-3 acid ethyl esters  1 g Oral TID  . pantoprazole  40 mg Oral Q1200  . simvastatin  20 mg Oral QHS  . sodium chloride  3 mL Intravenous Q12H  . DISCONTD: glyBURIDE  10 mg Oral BID WC  . DISCONTD: insulin detemir  15 Units Subcutaneous QHS      . sodium chloride 1 mL/kg/hr (07/01/11 0413)  . sodium chloride 10 mL/hr at 07/01/11 0000  . dextrose 10 mL/hr at 06/30/11 1330  . heparin 950 Units/hr (06/30/11 2200)    Physical Exam: The patient appears to be in no distress. Mentally improved this am and interacts normally with examiner.  Head and neck exam reveals that the pupils are equal and reactive.  The extraocular movements are full.  There is no scleral icterus.  Mouth and pharynx are benign.  No lymphadenopathy.  No carotid bruits.  The jugular venous pressure is normal.  Thyroid is not enlarged or tender.  Chest is  clear to percussion and auscultation.  No rales or rhonchi.  Expansion of the chest is symmetrical.  Heart reveals no abnormal lift or heave.  First and second heart sounds are normal.  There is no murmur gallop rub or click.  The abdomen is soft and nontender.  Bowel sounds are normoactive.  There is no hepatosplenomegaly or mass.  There are no abdominal bruits.  Extremities reveal no phlebitis or edema.  Pedal pulses are good.  There is no cyanosis or clubbing.  Neurologic exam is normal strength and no lateralizing weakness.  No sensory deficits.  Integument reveals no rash  Lab Results:  Basename 07/01/11 0500 06/30/11 0445  WBC 5.1 9.1  HGB 11.4* 12.4*  PLT 127* 143*    Basename 06/30/11 1412 06/30/11 0910  NA 136 137  K 4.0 3.6  CL 101 101  CO2 24 23  GLUCOSE 118* 64*  BUN 25* 29*  CREATININE 1.28 1.44*    Basename 06/29/11 1731 06/29/11 1049  TROPONINI >20.00* 15.31*   Hepatic Function Panel  Basename 06/30/11 1412  PROT 6.5  ALBUMIN 2.9*  AST 79*  ALT 20  ALKPHOS 38*  BILITOT 0.4  BILIDIR --  IBILI --    Basename 06/29/11 1049  CHOL 144  No results found for this basename: PROTIME in the last 72 hours  Imaging: Imaging results have been reviewed  Cardiac Studies:  Assessment/Plan:  1. NSTEMI 2. Essential hypertension. 3. Diabetes mellitus 4. Confusion probably secondary to hypoglycemia from glyburide.  Plan: Cardiac cath today. Glyburide has been stopped. Levemir has been reduced. MRI  Results pending.   LOS: 3 days    Cassell Clement 07/01/2011, 7:38 AM

## 2011-07-01 NOTE — Progress Notes (Signed)
Patient stable post cath.  He seems relatively coherent.  Discussed with granddaughter in detail, including review of the digital pictures.  He has a high risk stenosis in the av circ just after the OM1, with also moderate calcification at the lesion by fluoroscopy.  We have discussed possible approaches to this.  We have tentatively put him on the schedule Thursday for PCI.  Have started clopidogrel 300mg , and will give second dose tomorrow, then 75mg  per day.    He pulled his TR band off initially, but his hand looks good.  No evidence of hematoma.

## 2011-07-01 NOTE — Interval H&P Note (Signed)
History and Physical Interval Note:  07/01/2011 1:53 PM  Levi Robinson  has presented today for surgery, with the diagnosis of cp  The various methods of treatment have been discussed with the patient and family. After consideration of risks, benefits and other options for treatment, the patient has consented to  Procedure(s) (LRB): LEFT HEART CATHETERIZATION WITH CORONARY ANGIOGRAM (N/A) as a surgical intervention .  The patient's history has been reviewed, patient examined, no change in status, stable for surgery.  I have reviewed the patients' chart and labs.  Questions were answered to the patient's satisfaction.     [name]

## 2011-07-01 NOTE — Progress Notes (Addendum)
ANTICOAGULATION CONSULT NOTE - Follow Up Consult  Pharmacy Consult for heparin Indication: chest pain/ACS  Labs:  Basename 07/01/11 0500 06/30/11 1412 06/30/11 1315 06/30/11 0910 06/30/11 0445 06/29/11 1731 06/29/11 1049 06/29/11 0505 06/29/11 0500  HGB 11.4* -- -- -- 12.4* -- -- -- --  HCT 33.0* -- -- -- 35.7* -- -- 36.0* --  PLT 127* -- -- -- 143* -- -- 133* --  APTT -- -- -- -- -- -- -- >200* --  LABPROT -- -- -- -- -- -- -- 15.1 --  INR -- -- -- -- -- -- -- 1.17 --  HEPARINUNFRC 0.52 -- 0.34 -- 0.19* -- -- -- --  CREATININE -- 1.28 -- 1.44* -- -- -- 1.09 --  CKTOTAL -- -- -- -- -- 2295* 1978* -- 1074*  CKMB -- -- -- -- -- 137.8* 162.2* -- 95.8*  TROPONINI -- -- -- -- -- >20.00* 15.31* -- 5.53*    Assessment: 78 yo male with NSTEMI on heparin and at goal. Noted plans for cath today.  Goal of Therapy:  Heparin level 0.3-0.7 units/ml Platelet monitoring per protocol: yes   Plan:  -No heparin changes -Will follow post cath -CBC and heparin level daily  Benny Lennert PharmD BCPS 07/01/2011,8:27 AM

## 2011-07-02 LAB — BASIC METABOLIC PANEL
BUN: 20 mg/dL (ref 6–23)
Calcium: 8.6 mg/dL (ref 8.4–10.5)
GFR calc non Af Amer: 56 mL/min — ABNORMAL LOW (ref >90)
Glucose, Bld: 144 mg/dL — ABNORMAL HIGH (ref 70–99)

## 2011-07-02 LAB — CBC
HCT: 32.3 % — ABNORMAL LOW (ref 39.0–52.0)
Hemoglobin: 10.7 g/dL — ABNORMAL LOW (ref 13.0–17.0)
MCH: 30.9 pg (ref 26.0–34.0)
MCHC: 33.1 g/dL (ref 30.0–36.0)

## 2011-07-02 LAB — CARDIAC PANEL(CRET KIN+CKTOT+MB+TROPI)
Total CK: 283 U/L — ABNORMAL HIGH (ref 7–232)
Troponin I: 2.2 ng/mL (ref <0.30)

## 2011-07-02 LAB — GLUCOSE, CAPILLARY
Glucose-Capillary: 155 mg/dL — ABNORMAL HIGH (ref 70–99)
Glucose-Capillary: 187 mg/dL — ABNORMAL HIGH (ref 70–99)

## 2011-07-02 LAB — PLATELET INHIBITION P2Y12: Platelet Function  P2Y12: 213 [PRU] (ref 194–418)

## 2011-07-02 MED ORDER — SODIUM CHLORIDE 0.9 % IV SOLN: 1.0000 mL/kg/h | INTRAVENOUS | Status: DC

## 2011-07-02 MED ORDER — SODIUM CHLORIDE 0.9 % IJ SOLN
3.0000 mL | Freq: Two times a day (BID) | INTRAMUSCULAR | Status: DC
  Administered 2011-07-03: 3 mL via INTRAVENOUS

## 2011-07-02 MED ORDER — HEPARIN (PORCINE) IN NACL 100-0.45 UNIT/ML-% IJ SOLN
950.0000 [IU]/h | INTRAMUSCULAR | Status: DC
  Filled 2011-07-02 (×2): qty 250

## 2011-07-02 MED ORDER — SODIUM CHLORIDE 0.9 % IJ SOLN: 3.0000 mL | INTRAMUSCULAR | Status: DC | PRN

## 2011-07-02 MED ORDER — SODIUM CHLORIDE 0.9 % IV SOLN: 250.0000 mL | INTRAVENOUS | Status: DC | PRN

## 2011-07-02 MED ORDER — CLOPIDOGREL BISULFATE 300 MG PO TABS
300.0000 mg | ORAL_TABLET | Freq: Once | ORAL | Status: AC
  Administered 2011-07-02: 300 mg via ORAL
  Filled 2011-07-02: qty 1

## 2011-07-02 NOTE — Procedures (Signed)
EEG NUMBER:  [id number].  This routine EEG was requested in this 78 year old man who presents with altered mental status.  He is on no anticonvulsant medications.  The EEG was done with the patient awake but confused and asleep.  During periods of maximal wakefulness, background activities were composed mainly of diffuse poorly organized theta activities with some low- amplitude underlying delta activities.  The theta activities were low to medium amplitude.  Photic stimulation did not produce a driving response.  Hyperventilation was not performed.  During periods of decreased alertness, background activities took on a lower amplitudes composed mainly of low to very low amplitude delta activities with overlying theta activities.  EKG revealed a normal sinus bradycardia in the 50s.  CLINICAL INTERPRETATION:  This routine EEG done with the patient awake but confused and drowsy is abnormal.  Background activities in the theta range during wakefulness suggest a moderate encephalopathy of nonspecific etiology.          ______________________________ Denton Meek, MD    [id number] D:  07/01/2011 23:44:26  T:  07/01/2011 23:52:59  Job #:  [id number]

## 2011-07-02 NOTE — Progress Notes (Addendum)
ANTICOAGULATION CONSULT NOTE  Pharmacy Consult for heparin Indication: NTSEMI s/p cath. awaiting PCI  Allergies  Allergen Reactions  . Ativan (Lorazepam) Other (See Comments)    agitation    Patient Measurements: Height: 5\' 5"  (165.1 cm) Weight: 163 lb 12.8 oz (74.3 kg) IBW/kg (Calculated) : 61.5  Heparin Dosing Weight: 73kg  Vital Signs: Temp: 99 F (37.2 C) (06/19 0400) Temp src: Oral (06/19 0400) BP: 116/47 mmHg (06/19 0600) Pulse Rate: 59  (06/19 0600)  Labs:  Basename 07/02/11 0518 07/01/11 2121 07/01/11 1011 07/01/11 0500 06/30/11 1412 06/30/11 1315 06/30/11 0910 06/30/11 0445 06/29/11 1731 06/29/11 1049  HGB 10.7* -- -- 11.4* -- -- -- -- -- --  HCT 32.3* -- -- 33.0* -- -- -- 35.7* -- --  PLT 126* -- -- 127* -- -- -- 143* -- --  APTT -- -- -- -- -- -- -- -- -- --  LABPROT -- -- -- -- -- -- -- -- -- --  INR -- -- -- -- -- -- -- -- -- --  HEPARINUNFRC 0.28* -- -- 0.52 -- 0.34 -- -- -- --  CREATININE -- -- 1.26 -- 1.28 -- 1.44* -- -- --  CKTOTAL -- 477* -- -- -- -- -- -- 2295* 1978*  CKMB -- 5.6* -- -- -- -- -- -- 137.8* 162.2*  TROPONINI -- 3.64* -- -- -- -- -- -- >20.00* 15.31*    Estimated Creatinine Clearance: 38.9 ml/min (by C-G formula based on Cr of 1.26).  Assessment: 78 yo male with NSTEMI s/p cath for Heparin  Goal of Therapy:  Heparin level 0.3-0.5 units/ml Monitor platelets by anticoagulation protocol: Yes   Plan:  Previously therapeutic at current rate, so will continue for now.  Anticipate level to increase with additional time. F/U plan for PCI  AbbottGary Fleet 07/02/2011,6:45 AM     On further inspection, will increase heparin gtt slightly to 950 units/hr and f/u with AM HL.   Emmanuelle Hibbitts D. Laney Potash, PharmD, BCPS Pager:  [phone] 07/02/2011, 9:27 AM

## 2011-07-02 NOTE — Progress Notes (Signed)
Subjective:  He seems subjectively better.  No pain.  Enzymes have trended down.  Feels good.  No chest pain. Still some cough   Objective:  Vital Signs in the last 24 hours: Temp:  [97.6 F (36.4 C)-99 F (37.2 C)] 99 F (37.2 C) (06/19 0400) Pulse Rate:  [53-67] 59  (06/19 0700) Resp:  [8-21] 17  (06/19 0700) BP: (101-136)/(37-64) 117/45 mmHg (06/19 0700) SpO2:  [95 %-100 %] 99 % (06/19 0700) Weight:  [163 lb 12.8 oz (74.3 kg)] 163 lb 12.8 oz (74.3 kg) (06/19 0400)  Intake/Output from previous day: 06/18 0701 - 06/19 0700 In: 1954.5 [P.O.:300; I.V.:1654.5] Out: 3275 [Urine:3275]   Physical Exam: General: Well developed, well nourished, in no acute distress.  More alert than yesterday Head:  Normocephalic and atraumatic. Lungs: Clear to auscultation and percussion. Heart: Normal S1 and S2.  No murmur, rubs or gallops.  Pulses: Pulses normal in all 4 extremities. Extremities: No clubbing or cyanosis. No edema. Neurologic: Alert and oriented x 3.    Lab Results:  Basename 07/02/11 0518 07/01/11 0500  WBC 5.7 5.1  HGB 10.7* 11.4*  PLT 126* 127*    Basename 07/02/11 0518 07/01/11 1011  NA 135 137  K 4.1 4.0  CL 103 103  CO2 22 25  GLUCOSE 144* 115*  BUN 20 24*  CREATININE 1.14 1.26    Basename 07/01/11 2121 06/29/11 1731  TROPONINI 3.64* >20.00*   Hepatic Function Panel  Basename 06/30/11 1412  PROT 6.5  ALBUMIN 2.9*  AST 79*  ALT 20  ALKPHOS 38*  BILITOT 0.4  BILIDIR --  IBILI --    Basename 06/29/11 1049  CHOL 144   No results found for this basename: PROTIME in the last 72 hours  Imaging: Mr Brain Wo Contrast  07/01/2011  *RADIOLOGY REPORT*  Clinical Data: New onset of altered mental status.  MRI HEAD WITHOUT CONTRAST  Technique:  Multiplanar, multiecho pulse sequences of the brain and surrounding structures were obtained according to standard protocol without intravenous contrast.  Comparison: None.  Findings: The study is mildly degraded by  patient motion.  Per the technologist notes, the patient was given ativan, but had an adverse reaction and became more agitated.  Moderate generalized atrophy is present.  Diffuse periventricular subcortical white matter changes are present bilaterally.  There is no flow signal in the right internal carotid artery below the ophthalmic segment.  Flow is evident within the right MCA.  Flow is present in the other intracranial arteries. No acute infarct, hemorrhage, mass lesion is present.  The ventricles are proportionate to the degree of atrophy.  No significant extra-axial fluid collections are present.  The patient is status post bilateral lens extractions.  The globes and orbits are otherwise intact.  The paranasal sinuses and mastoid air cells are clear.  IMPRESSION:  1.  No acute or focal abnormality to explain the patient's symptoms. 2.  Atrophy and extensive white matter disease.  This likely reflects the sequelae of chronic microvascular ischemia. 3.  The right internal carotid artery is occluded with flow evident above the level of the ophthalmic artery.  Original Report Authenticated By: Jamesetta Orleans. MATTERN, M.D.    EKG:  SB.  RBBB. LAFB  Cardiac Studies:  See results.  Values down.    Assessment/Plan:  1.  Non STEMI  -  Large enzyme bump, yet LV wall motion is normal.  No Q waves.  High grade lesion, with complex anatomy in a large distribution area.  2.  Encephalopathy secondary to drugs  --  Resolving   Plan 1.  Review with colleagues with tentative plan for higher risk PCI tomorrow.  Discussed at length with granddaughter last pm including pictures.  The lesion is 95% and supplies a large territory with no WMA.  It involves a sidebranch just out of the ostium of the CFX.  Risk would be increased.  He is agreeable but will discuss again with daughter. 2.  Monitor MS today   -- very difficult to manage on table yesterday and will likely need fem approach.         [name],  MD, [facility], FSCAI 07/02/2011, 7:55 AM

## 2011-07-03 ENCOUNTER — Encounter (HOSPITAL_COMMUNITY)
Admission: AD | Disposition: A | Discharge status: Home / Self Care | Payer: Self-pay | Source: Home / Self Care | Attending: Cardiology

## 2011-07-03 ENCOUNTER — Encounter (HOSPITAL_COMMUNITY): Payer: Self-pay | Admitting: Cardiology

## 2011-07-03 DIAGNOSIS — I2510 Atherosclerotic heart disease of native coronary artery without angina pectoris: Secondary | ICD-10-CM

## 2011-07-03 DIAGNOSIS — I214 Non-ST elevation (NSTEMI) myocardial infarction: Secondary | ICD-10-CM

## 2011-07-03 HISTORY — PX: PERCUTANEOUS CORONARY STENT INTERVENTION (PCI-S): SHX5485

## 2011-07-03 LAB — CBC
HCT: 33.4 % — ABNORMAL LOW (ref 39.0–52.0)
Hemoglobin: 11.5 g/dL — ABNORMAL LOW (ref 13.0–17.0)
MCH: 32.2 pg (ref 26.0–34.0)
RBC: 3.57 MIL/uL — ABNORMAL LOW (ref 4.22–5.81)

## 2011-07-03 LAB — BASIC METABOLIC PANEL
CO2: 24 mEq/L (ref 19–32)
Glucose, Bld: 200 mg/dL — ABNORMAL HIGH (ref 70–99)
Potassium: 3.8 mEq/L (ref 3.5–5.1)
Sodium: 136 mEq/L (ref 135–145)

## 2011-07-03 LAB — POCT ACTIVATED CLOTTING TIME: Activated Clotting Time: 359 seconds

## 2011-07-03 LAB — GLUCOSE, CAPILLARY: Glucose-Capillary: 160 mg/dL — ABNORMAL HIGH (ref 70–99)

## 2011-07-03 SURGERY — PERCUTANEOUS CORONARY STENT INTERVENTION (PCI-S)
Anesthesia: LOCAL

## 2011-07-03 MED ORDER — LIDOCAINE HCL (PF) 1 % IJ SOLN
INTRAMUSCULAR | Status: AC
  Filled 2011-07-03: qty 30

## 2011-07-03 MED ORDER — CLOPIDOGREL BISULFATE 75 MG PO TABS
75.0000 mg | ORAL_TABLET | Freq: Every day | ORAL | Status: DC
  Administered 2011-07-04: 75 mg via ORAL
  Filled 2011-07-03: qty 1

## 2011-07-03 MED ORDER — ATROPINE SULFATE 1 MG/ML IJ SOLN
INTRAMUSCULAR | Status: AC
  Filled 2011-07-03: qty 1

## 2011-07-03 MED ORDER — BIVALIRUDIN 250 MG IV SOLR
INTRAVENOUS | Status: AC
  Filled 2011-07-03: qty 250

## 2011-07-03 MED ORDER — HEPARIN (PORCINE) IN NACL 2-0.9 UNIT/ML-% IJ SOLN
INTRAMUSCULAR | Status: AC
  Filled 2011-07-03: qty 2000

## 2011-07-03 MED ORDER — SODIUM CHLORIDE 0.9 % IV SOLN: INTRAVENOUS | Status: AC

## 2011-07-03 MED ORDER — NITROGLYCERIN 0.2 MG/ML ON CALL CATH LAB
INTRAVENOUS | Status: AC
  Filled 2011-07-03: qty 1

## 2011-07-03 MED ORDER — SODIUM CHLORIDE 0.9 % IV SOLN
0.1000 mg/kg/h | INTRAVENOUS | Status: DC
  Filled 2011-07-03 (×2): qty 250

## 2011-07-03 NOTE — Progress Notes (Signed)
Right groin femoral sheath removed using manual pressure x 20 minutes. No hematoma or bleeding noted at the site. Gauze pressure dressing applied. Will monitor closely. Instructed patient to keep head down and right leg straight. Granddaughter at bedside. Levi Robinson

## 2011-07-03 NOTE — H&P (View-Only) (Signed)
Patient is stable.  I met with his grandaughter  (health care POA) for nearly forty-five minutes last night and we reviewed options.  I also had Dr. McAlhany and Mclean look at the films as well.  He had a marked bump in his enzymes on admission, and yet, his LV function is preserved despite subtotal occlusion of the CFX.  He is now pain free.  The consensus opinion is that the territory is large, and given the presentation we should attempt PCI. I reviewed with patient the risks along with his granddaughter, and his granddaughter and I reviewed the angios last night in the cath lab with regard to the specifics for potential complication.  It is moderately high risk.  She and his grandson then discussed with him last night and he is willing to proceed.  Our tentative strategy is for cutting balloon PCI of the CFX with possible adjunctive stenting.  He is loaded with clopidogrel and has very mild hypo-responsiveness to plavix  (PRU 213).  No current pain.  He is oriented times 4 this am.  He does not follow directions very well.  We will approach from the femoral approach so we are best prepared for bifurcation anatomy if T or culotte stenting is required.    Vitals, exam, and labs all reviewed.   

## 2011-07-03 NOTE — Interval H&P Note (Signed)
History and Physical Interval Note:  07/03/2011 12:54 PM  Levi Robinson  has presented today for surgery, with the diagnosis of CAD  The various methods of treatment have been discussed with the patient and family. After consideration of risks, benefits and other options for treatment, the patient has consented to  Procedure(s) (LRB): PERCUTANEOUS CORONARY STENT INTERVENTION (PCI-S) (N/A) as a surgical intervention .  The patient's history has been reviewed, patient examined, no change in status, stable for surgery.  I have reviewed the patients' chart and labs.  Questions were answered to the patient's satisfaction.     [name]

## 2011-07-03 NOTE — CV Procedure (Signed)
   CARDIAC CATH NOTE  Name: Levi Robinson MRN: [id number] DOB: December 15, 1921  Procedure: PTCA and stenting of the CFX proximal vessel  Indication: Patient presented with a non STEMI.  Enzymes were quite high.  EF was normal.  Critical lesion in OM  Procedural Details: The right groin was prepped, draped, and anesthetized with 1% lidocaine. Using the modified Seldinger technique, a 7 Fr sheath was introduced into the right femoral artery.  Weight-based bivalirudin was given for anticoagulation. Once a therapeutic ACT was achieved, a 7 [place] XB guide catheter was inserted in order to be prepared for possible rotational atherectomy at the lesion site due to excessive calcium. .  A BMW coronary guidewire was used to cross the side branch lesion.  The wide lesion was crossed with a 2.0 OTW balloon with a LUGE wire.  The lesion was predilated with a 2.0 compliant  balloon.   Repeat dilatations were done with both 2.11mm and 2.5 mm [place] balloons.  We felt we needed to stent the artery.  It was stented with a 2.61mm Medtronic Resolute DES stent to 15 atm.   The stent was postdilated with a 2.75 and 3.0 noncompliant balloon to high pressures.  Following PCI, there was 10%-20% residual stenosis and TIMI-3 flow.  This was consistent with poor vessel expansion due to calcification.   Final angiography confirmed a satisfactory result. The patient tolerated the procedure well. There were no immediate procedural complications. Femoral sheath was sewn into place. The patient was transferred to the post catheterization recovery area for further monitoring.  Lesion Data: Vessel: CFX Percent stenosis (pre): 95% TIMI-flow (pre):  3 Stent:  2.5 by 18 Medtronic Resolute post dil with 3.0 [place] Percent stenosis (post): 20% TIMI-flow (post): 3  The lesion was an ostial bifurcation lesion.  The ramus was protected with a BMW wire, and did not close with excellent TIMI 3 flow.  At the end, there was 70% ostial narrowing, but with  good flow.  The stent was agressively deployed, and post dilated with larger [place] balloons a high pressure.  There remained, despite good initial expansion, some malexpansion at the lesion site due to calcification with 10-20% residual.  Should also be noted that an intitial 2.25 [place] balloon fully expanded as a pre-dil.  Conclusions:  1.  Successful complex PCI involving the proximal CFX 2.  Recent non STEMI   Recommendations:  1.  Indefinite DAPT as long as he tolerates.   2.  Hopefully home in AM.  [name] 07/03/2011, 12:59 PM

## 2011-07-03 NOTE — Progress Notes (Signed)
Nutrition Brief Note:   RD pulled to pt for poor po intake, no meals documented this admission. Pt states that he has been eating well PTA and eaten meals when able (has been NPO several times). Pt denies any recent weight loss. States his UBW is 175 lbs, weight is down from that likely r/t fluids, was 175 lbs at admission. Denies supplement use at home. Follows a low sodium diet.  Body mass index is 27.26 kg/(m^2). Pt is overweight.  No nutrition dx or interventions at this time. Please consult RD if needed.  [name]

## 2011-07-03 NOTE — Progress Notes (Signed)
ANTICOAGULATION CONSULT NOTE  Pharmacy Consult:  Heparin Indication: NTSEMI s/p cath, awaiting PCI  Allergies  Allergen Reactions  . Ativan (Lorazepam) Other (See Comments)    agitation    Patient Measurements: Height: 5\' 5"  (165.1 cm) Weight: 163 lb 12.8 oz (74.3 kg) IBW/kg (Calculated) : 61.5  Heparin Dosing Weight: 73kg  Vital Signs: Temp: 97.9 F (36.6 C) (06/20 0828) Temp src: Oral (06/20 0828) BP: 137/55 mmHg (06/20 0930) Pulse Rate: 57  (06/20 0930)  Labs:  Alvira Philips 07/03/11 0545 07/02/11 2047 07/02/11 0518 07/01/11 2121 07/01/11 1011 07/01/11 0500  HGB 11.5* -- 10.7* -- -- --  HCT 33.4* -- 32.3* -- -- 33.0*  PLT 134* -- 126* -- -- 127*  APTT -- -- -- -- -- --  LABPROT -- -- -- -- -- --  INR -- -- -- -- -- --  HEPARINUNFRC 0.33 -- 0.28* -- -- 0.52  CREATININE 1.09 -- 1.14 -- 1.26 --  CKTOTAL -- 283* -- 477* -- --  CKMB -- 4.3* -- 5.6* -- --  TROPONINI -- 2.20* -- 3.64* -- --    Estimated Creatinine Clearance: 45 ml/min (by C-G formula based on Cr of 1.09).     Assessment: 78 y.o. male with h/o HTN and DM, admitted from [facility] for CP when found with troponin 0.28.  Patient continues on IV heparin with plan for PCI today.  Heparin level currently therapeutic; hemoglobin stable, platelets improving, no bleeding reported.   Goal of Therapy:  Heparin level 0.3-0.5 units/ml  Monitor platelets by anticoagulation protocol: Yes   Plan - Continue heparin gtt at 950 units/hr - Daily HL / CBC - F/U post PCI     Kathryn Linarez D. Laney Potash, PharmD, BCPS Pager:  [phone] 07/03/2011, 10:09 AM

## 2011-07-03 NOTE — Progress Notes (Signed)
Patient is stable.  I met with his grandaughter  (health care POA) for nearly forty-five minutes last night and we reviewed options.  I also had Dr. Clifton James and Shirlee Latch look at the films as well.  He had a marked bump in his enzymes on admission, and yet, his LV function is preserved despite subtotal occlusion of the CFX.  He is now pain free.  The consensus opinion is that the territory is large, and given the presentation we should attempt PCI. I reviewed with patient the risks along with his granddaughter, and his granddaughter and I reviewed the angios last night in the cath lab with regard to the specifics for potential complication.  It is moderately high risk.  She and his grandson then discussed with him last night and he is willing to proceed.  Our tentative strategy is for cutting balloon PCI of the CFX with possible adjunctive stenting.  He is loaded with clopidogrel and has very mild hypo-responsiveness to plavix  (PRU 213).  No current pain.  He is oriented times 4 this am.  He does not follow directions very well.  We will approach from the femoral approach so we are best prepared for bifurcation anatomy if T or culotte stenting is required.    Vitals, exam, and labs all reviewed.

## 2011-07-04 ENCOUNTER — Encounter (HOSPITAL_COMMUNITY): Payer: Self-pay | Admitting: Physician Assistant

## 2011-07-04 DIAGNOSIS — I2510 Atherosclerotic heart disease of native coronary artery without angina pectoris: Secondary | ICD-10-CM

## 2011-07-04 LAB — BASIC METABOLIC PANEL
Calcium: 8.6 mg/dL (ref 8.4–10.5)
Chloride: 102 mEq/L (ref 96–112)
Creatinine, Ser: 1.07 mg/dL (ref 0.50–1.35)
GFR calc Af Amer: 70 mL/min — ABNORMAL LOW (ref >90)

## 2011-07-04 LAB — CBC
MCV: 92.5 fL (ref 78.0–100.0)
Platelets: 138 10*3/uL — ABNORMAL LOW (ref 150–400)
RDW: 12.8 % (ref 11.5–15.5)
WBC: 4.5 10*3/uL (ref 4.0–10.5)

## 2011-07-04 LAB — GLUCOSE, CAPILLARY: Glucose-Capillary: 240 mg/dL — ABNORMAL HIGH (ref 70–99)

## 2011-07-04 MED ORDER — CLOPIDOGREL BISULFATE 75 MG PO TABS: 75.0000 mg | ORAL_TABLET | Freq: Every day | ORAL | Status: AC

## 2011-07-04 MED ORDER — CARVEDILOL 6.25 MG PO TABS: 6.2500 mg | ORAL_TABLET | Freq: Two times a day (BID) | ORAL | Status: DC

## 2011-07-04 MED ORDER — NITROGLYCERIN 0.4 MG SL SUBL: 0.4000 mg | SUBLINGUAL_TABLET | SUBLINGUAL | Status: DC | PRN

## 2011-07-04 MED ORDER — INSULIN DETEMIR 100 UNIT/ML ~~LOC~~ SOLN: 20.0000 [IU] | Freq: Every day | SUBCUTANEOUS | Status: DC

## 2011-07-04 MED ORDER — ASPIRIN 81 MG PO TABS: 81.0000 mg | ORAL_TABLET | Freq: Every day | ORAL | Status: DC

## 2011-07-04 MED ORDER — ISOSORBIDE MONONITRATE ER 30 MG PO TB24: 30.0000 mg | ORAL_TABLET | Freq: Every day | ORAL | Status: DC

## 2011-07-04 MED FILL — Dextrose Inj 5%: INTRAVENOUS | Qty: 50 | Status: AC

## 2011-07-04 NOTE — Discharge Summary (Signed)
Discharge Summary   Patient ID: Levi Robinson MRN: [id number], DOB/AGE: 03/19/1921 78 y.o. Admit date: 06/28/2011 D/C date:     07/04/2011   Primary Discharge Diagnoses:  1. Newly diagnosed CAD with NSTEMI this admission s/p complex PCI to prox Cx 07/03/11 - preserved LV function 2. Intermittent AMS felt to be encephalopathy secondary to drugs - MRI of brain showed no acute infarct, did show R carotid dz - abnormal EEG (nonspecific encephalopathy) -  RPR, TSH, folate, B12 all WNL 3. DM with episodes of hygoglycemia this admission 4. Carotid disease by MRI - for OP carotid dopplers 5. Anemia, likely iatrogenic from procedures/sticks 6. WCT felt to be transient atrial fib with abberration  7. HTN  [facility] Course: Levi Robinson is an 78 y/o M with hx of HTN, DM who presented to [facility] with 1 week history of intermittent chest pain. 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 He described the CP as a sharp pain, non-radiating, associated with SOB and n/v. His EKG showed SR with RBBB, 1st degree AVB and initial troponin was mildly elevated at 0.28. He was admitted for NSTEMI. He received Aspirin 324 mg, Nitroglycerin, Morphine, and he is on Heparin drip per ACS protocol. He was also started on temporary low-dose Lasix for mild bibasilar rales and low-dose coreg. CE's were cycled and eventual peak troponin was >20. Low dose Imdur was added. He was observed over the weekend. 2D echo was obtained demonstrating mod-severe LVH, EF 65-70%, grade 1 diastolic dysfunction, PA pressure . Levi Robinson personally reviewed the echo and did feel he had hypokinesis of the mid-distal inferior wall and posterior wall. On 6/17, the patient was noted to get intermittently agitated and confused. He was able to be cooperative but was not able to accurately state where he was. Neurology was consulted and suspected exacerbation of mental changes due to  displacement from usual familiar surroundings and regular routines, as well as acute medical illness including intermittent hypoglycemia. His DM meds were adjusted. B12, TSH, Folate, RPR, ammonia were all normal. MRI was obtained demonstrating no acute or focal abnormality to explain the patient's symptoms. It also showed that the RICA was occluded with flow evident above the level of the opthalmic artery. EEG showed nonspecific encephalopathy, which Levi Robinson felt could be secondary to drugs. Neurology signed off and recommended no further neurologic workup.   His mental status stabilized and he went for cath on 07/01/11 demonstrating high grade stenosis of the AV circ which appears to be the culprit with complex bifurcation disease, EF 55-65%. However he did become confused in the lab which made the procedure somewhat difficult. He was loaded with Plavix. His mental status improved yesterday enough to proceed with interventional cath. Levi Robinson performed successful complex PCI involving the proximal CFX with stent placement with recommendation for indefinite DAPT as long as he tolerates. It is noted that he is mildly anemic today but this is felt secondary to the procedures/sticks he received while in the [facility]. There is no obvious evidence for bleeding. In regards to his carotid disease, Levi Robinson has recommended OP carotid dopplers. On tele today he was noted to have a brief WCT felt to represent atrial fib with abberration, but remains in sinus at this time. He is not felt to be a good candidate for coumadin.  Today the patient is doing well without complaints. Groin site is stable. He has ambulated with cardiac rehab.  The patient was seen and examined today and felt stable for discharge by Levi Robinson. Home health RN was arranged. He lives with his granddaughter.  Discharge Vitals: Blood pressure 104/52, pulse 62, temperature 98.6 F (37 C), temperature source Oral, resp. rate 20, height 5\' 5"   (1.651 m), weight 163 lb 9.3 oz (74.2 kg), SpO2 97.00%.  Labs: Lab Results  Component Value Date   WBC 4.5 07/04/2011   HGB 10.6* 07/04/2011   HCT 30.9* 07/04/2011   MCV 92.5 07/04/2011   PLT 138* 07/04/2011    Lab 07/04/11 0600 06/30/11 1412  NA 135 --  K 4.0 --  CL 102 --  CO2 23 --  BUN 13 --  CREATININE 1.07 --  CALCIUM 8.6 --  PROT -- 6.5  BILITOT -- 0.4  ALKPHOS -- 38*  ALT -- 20  AST -- 79*  GLUCOSE 180* --    Basename 07/02/11 2047 07/01/11 2121  CKTOTAL 283* 477*  CKMB 4.3* 5.6*  TROPONINI 2.20* 3.64*   Lab Results  Component Value Date   CHOL 144 06/29/2011   HDL 62 06/29/2011   LDLCALC 75 06/29/2011   TRIG 35 06/29/2011    Diagnostic Studies/Procedures   1. 2D Echo 06/29/11 Study Conclusions - Left ventricle: The cavity size was normal. Wall thickness was increased in a pattern of moderate to severe LVH. Systolic function was vigorous. The estimated ejection fraction was in the range of 65% to 70%. Wall motion was normal; there were no regional wall motion abnormalities. Doppler parameters are consistent with abnormal left ventricular relaxation (grade 1 diastolic dysfunction). - Aortic valve: Cusp separation was mildly reduced. Trivial regurgitation. - Mitral valve: Mild regurgitation. - Pulmonary arteries: Systolic pressure was mildly increased. PA peak pressure: 32mm Hg (S). Levi Robinson reviewed this echo and felt he had hypokinesis of the mid-distal inferior wall and posterior wall.  2. Cardiac catheterizations this admission, please see full report and above for summary.  3. Chest 2 View 06/28/2011  *RADIOLOGY REPORT*  Clinical Data: Left-sided chest pain; history of diabetes.  CHEST - 2 VIEW  Comparison: None.  Findings: Mild left basilar opacity, better seen on the lateral view, may reflect atelectasis given elevation of the left hemidiaphragm.  There is no evidence of pleural effusion or pneumothorax.  The heart is borderline normal in size; the mediastinal  contour is within normal limits.  No acute osseous abnormalities are seen.  IMPRESSION: Elevation of the left hemidiaphragm; mild left basilar opacity likely reflects atelectasis, though mild pneumonia could conceivably have a similar appearance.  Original Report Authenticated By: Tonia Ghent, M.D.   4. Mr Brain Wo Contrast 07/01/2011  *RADIOLOGY REPORT*  Clinical Data: New onset of altered mental status.  MRI HEAD WITHOUT CONTRAST  Technique:  Multiplanar, multiecho pulse sequences of the brain and surrounding structures were obtained according to standard protocol without intravenous contrast.  Comparison: None.  Findings: The study is mildly degraded by patient motion.  Per the technologist notes, the patient was given ativan, but had an adverse reaction and became more agitated.  Moderate generalized atrophy is present.  Diffuse periventricular subcortical white matter changes are present bilaterally.  There is no flow signal in the right internal carotid artery below the ophthalmic segment.  Flow is evident within the right MCA.  Flow is present in the other intracranial arteries. No acute infarct, hemorrhage, mass lesion is present.  The ventricles are proportionate to the degree of atrophy.  No significant extra-axial fluid collections are present.  The patient is status post bilateral lens extractions.  The globes and orbits are otherwise intact.  The paranasal sinuses and mastoid air cells are clear.  IMPRESSION:  1.  No acute or focal abnormality to explain the patient's symptoms. 2.  Atrophy and extensive white matter disease.  This likely reflects the sequelae of chronic microvascular ischemia. 3.  The right internal carotid artery is occluded with flow evident above the level of the ophthalmic artery.  Original Report Authenticated By: Jamesetta Orleans. MATTERN, M.D.    Discharge Medications   Medication List  As of 07/04/2011 10:15 AM   STOP taking these medications         diphenhydrAMINE 25 mg  capsule      glyBURIDE 5 MG tablet      naproxen sodium 220 MG tablet         TAKE these medications         aspirin 81 MG tablet   Take 1 tablet (81 mg total) by mouth daily.      carvedilol 6.25 MG tablet   Commonly known as: COREG   Take 1 tablet (6.25 mg total) by mouth 2 (two) times daily with a meal.      clopidogrel 75 MG tablet   Commonly known as: PLAVIX   Take 1 tablet (75 mg total) by mouth daily with breakfast.      insulin detemir 100 UNIT/ML injection   Commonly known as: LEVEMIR   Inject 20 Units into the skin daily.      isosorbide mononitrate 30 MG 24 hr tablet   Commonly known as: IMDUR   Take 1 tablet (30 mg total) by mouth daily.      levothyroxine 25 MCG tablet   Commonly known as: SYNTHROID, LEVOTHROID   Take 25 mcg by mouth daily.      lisinopril-hydrochlorothiazide 10-12.5 MG per tablet   Commonly known as: PRINZIDE,ZESTORETIC   Take 1 tablet by mouth daily.      nitroGLYCERIN 0.4 MG SL tablet   Commonly known as: NITROSTAT   Place 1 tablet (0.4 mg total) under the tongue every 5 (five) minutes x 3 doses as needed for chest pain.      omega-3 acid ethyl esters 1 G capsule   Commonly known as: LOVAZA   Take 1 g by mouth 3 (three) times daily.      pantoprazole 40 MG tablet   Commonly known as: PROTONIX   Take 40 mg by mouth daily.      simvastatin 40 MG tablet   Commonly known as: ZOCOR   Take 20 mg by mouth at bedtime.            Disposition   The patient will be discharged in stable condition to home. Discharge Orders    Future Appointments: Provider: Department: Dept Phone: Center:   07/07/2011 10:45 AM [facility] [phone] [facility]   07/07/2011 11:00 AM [facility] 3 [facility]  None   07/15/2011 12:00 PM Ok Anis, NP [facility] [phone] [facility]     Future Orders Please Complete By Expires   Diet - low sodium heart healthy      Comments:   Diabetic Diet   Increase  activity slowly      Comments:   No driving until cleared by your doctor. No lifting over 10 lbs for 2 weeks. No sexual activity for 2 weeks. Keep procedure site clean & dry. If you notice increased pain, swelling, bleeding  or pus, call/return!  You may shower, but no soaking baths/hot tubs/pools for 1 week.     Follow-up Information    Follow up with [facility]. (You will have an ultrasound of your neck arteries (carotid dopplers) to look at the blood flow on 07/07/11 at 11am. You will also get bloodwork that day (CBC).)    Contact information:   [address] [place] [address] [phone]      Follow up with Nicolasa Ducking, NP. (You will see Nicolasa Ducking, NP on 07/15/11 at 12pm)    Contact information:   [address] [place] [address] [phone]       Follow up with Primary Care Doctor. (Please call your doctor today to make a follow-up appointment within the next few days to discuss your diabetes medicines since you had several episodes of low blood sugar during your stay which required medicine adjustment.)           Outstanding tests: - carotid dopplers, CBC same day given anemia to ensure stability  Duration of Discharge Encounter: Greater than 30 minutes including physician and PA time.  Signed, Taelyn Broecker PA-C 07/04/2011, 10:15 AM

## 2011-07-04 NOTE — Progress Notes (Signed)
Patient: Levi Robinson Date of Encounter: 07/04/2011, 6:58 AM Admit date: 06/28/2011     Subjective  A+Ox3 this morning. No CP, SOB, palps, syncope. No obvious source of bleeding (but hasn't had recent stools per pt).   Objective   Telemetry: NSR but episode of brief unspecified tach yesterday afternoon - will ask MD to review - rhythm is wider than baseline but irregular as well  Physical Exam: Filed Vitals:   07/04/11 0600  BP: 109/45  Pulse: 58  Temp: 98.5  Resp: 16   General: Well developed, well nourished elderly M in no acute distress. Head: Normocephalic, atraumatic, sclera non-icteric, no xanthomas, nares are without discharge.  Neck: R carotid bruit. L carotid without bruit. JVD not elevated. Lungs: Clear bilaterally to auscultation without wheezes, rales, or rhonchi. Breathing is unlabored. Heart: RRR S1 S2.No significant murmurs, rubs or gallops.  Abdomen: Soft, non-tender, non-distended with normoactive bowel sounds. No hepatomegaly. No rebound/guarding. No obvious abdominal masses. Msk:  Strength and tone appear normal for age. Extremities: No clubbing or cyanosis. No edema.  Distal pedal pulses are 2+ and equal bilaterally. R groin site without hematoma, oozing or bruit. Neuro: Alert and oriented X 3. Moves all extremities spontaneously. Psych:  Responds to questions appropriately with a somewhat flat affect.    Intake/Output Summary (Last 24 hours) at 07/04/11 0658 Last data filed at 07/04/11 0600  Gross per 24 hour  Intake   1937 ml  Output   2895 ml  Net   -958 ml    Inpatient Medications:    . aspirin EC  81 mg Oral Daily  . bivalirudin      . carvedilol  6.25 mg Oral BID WC  . clopidogrel  75 mg Oral Q breakfast  . heparin      . hydrochlorothiazide  12.5 mg Oral Daily  . insulin detemir  20 Units Subcutaneous Daily  . isosorbide mononitrate  30 mg Oral Daily  . levothyroxine  25 mcg Oral QAC breakfast  . lidocaine      . lisinopril  10 mg Oral  Daily  . nitroGLYCERIN      . omega-3 acid ethyl esters  1 g Oral TID  . pantoprazole  40 mg Oral Q1200  . simvastatin  20 mg Oral QHS  . DISCONTD: sodium chloride  3 mL Intravenous Q12H    Labs:  Basename 07/04/11 0600 07/03/11 0545  NA 135 136  K 4.0 3.8  CL 102 103  CO2 23 24  GLUCOSE 180* 200*  BUN 13 17  CREATININE 1.07 1.09  CALCIUM 8.6 8.8  MG -- --  PHOS -- --    Basename 07/04/11 0600 07/03/11 0545  WBC 4.5 4.4  NEUTROABS -- --  HGB 10.6* 11.5*  HCT 30.9* 33.4*  MCV 92.5 93.6  PLT 138* 134*    Basename 07/02/11 2047 07/01/11 2121  CKTOTAL 283* 477*  CKMB 4.3* 5.6*  TROPONINI 2.20* 3.64*   Radiology/Studies:  1. Chest 2 View 06/28/2011  *RADIOLOGY REPORT*  Clinical Data: Left-sided chest pain; history of diabetes.  CHEST - 2 VIEW  Comparison: None.  Findings: Mild left basilar opacity, better seen on the lateral view, may reflect atelectasis given elevation of the left hemidiaphragm.  There is no evidence of pleural effusion or pneumothorax.  The heart is borderline normal in size; the mediastinal contour is within normal limits.  No acute osseous abnormalities are seen.  IMPRESSION: Elevation of the left hemidiaphragm; mild left basilar opacity likely reflects  atelectasis, though mild pneumonia could conceivably have a similar appearance.  Original Report Authenticated By: Tonia Ghent, M.D.   2. Mr Brain Wo Contrast 07/01/2011  *RADIOLOGY REPORT*  Clinical Data: New onset of altered mental status.  MRI HEAD WITHOUT CONTRAST  Technique:  Multiplanar, multiecho pulse sequences of the brain and surrounding structures were obtained according to standard protocol without intravenous contrast.  Comparison: None.  Findings: The study is mildly degraded by patient motion.  Per the technologist notes, the patient was given ativan, but had an adverse reaction and became more agitated.  Moderate generalized atrophy is present.  Diffuse periventricular subcortical white matter  changes are present bilaterally.  There is no flow signal in the right internal carotid artery below the ophthalmic segment.  Flow is evident within the right MCA.  Flow is present in the other intracranial arteries. No acute infarct, hemorrhage, mass lesion is present.  The ventricles are proportionate to the degree of atrophy.  No significant extra-axial fluid collections are present.  The patient is status post bilateral lens extractions.  The globes and orbits are otherwise intact.  The paranasal sinuses and mastoid air cells are clear.  IMPRESSION:  1.  No acute or focal abnormality to explain the patient's symptoms. 2.  Atrophy and extensive white matter disease.  This likely reflects the sequelae of chronic microvascular ischemia. 3.  The right internal carotid artery is occluded with flow evident above the level of the ophthalmic artery.  Original Report Authenticated By: Jamesetta Orleans. MATTERN, M.D.     Assessment and Plan   1. Newly diagnosed CAD with NSTEMI this admission s/p complex PCI to prox Cx 07/03/11, tolerated well. Cont ASA, BB, Plavix, Zocor, Imdur. 2. Intermittent AMS with abnormal EEG (nonspecific encephalopathy) - per Dr. Riley Kill, encephalopathy felt secondary to drugs. RPR, TSH, folate, B12 all WNL.  3. DM with episodes of hygoglycemia this admission - meds have been adjusted, will need close f/u with primary doctor. 4. Carotid disease by MRI - question further workup or if any relationship to #2. 5. Anemia - unclear baseline and some reduction in Hgb may be due to procedures/sticks 6. Brief tach on monitor - will ask MD to review tele - WCT vs abberrant SVT of some kind--suspect afib with aberration.   Signed, Ronie Spies PA-C  Patient is stable, no chest pain, and "chomping at the bit" to go home.  Review of rhythms suggest probable short burst of atrial fibrillation with aberration as complex is irregularly irregular, and similar in morphology both initiation and after  termination.  Either way, I do not feel he is a good candidate for warfarin, and his overall LV function is nearly normal.  He is getting some restless, and I think the best option is to shoot for dc later today with early follow up with me in the clinic.  Groin is stable.  Would continue DAPT indefinitely given the nature of lesion  (very stiff).  He could be set up for outpatient carotid dopplers.   Insulin has been adjusted and he will need close follow up with his family MD.

## 2011-07-04 NOTE — Progress Notes (Signed)
CARDIAC REHAB PHASE I   PRE:  Rate/Rhythm: 64 SR    BP: sitting 121/46    SaO2:   MODE:  Ambulation: 350 ft   POST:  Rate/Rhythm: 82 SR    BP: sitting 139/55     SaO2:   Pt to BR before walk, had BM, removed condom catheter. Fairly independent. Walked assist x1, no assistive device. Fairly steady. Feel RW would be hindrance for him. Return to chair, no c/o. Brief ed. Not interested in CRPII. Seemed to comprehend most of ed. RN to review NTG with granddaughter and pt. [phone]   Harriet Masson CES, ACSM

## 2011-07-07 ENCOUNTER — Other Ambulatory Visit: Payer: Self-pay | Admitting: Cardiology

## 2011-07-07 ENCOUNTER — Encounter (INDEPENDENT_AMBULATORY_CARE_PROVIDER_SITE_OTHER): Payer: Medicare Other

## 2011-07-07 ENCOUNTER — Ambulatory Visit (INDEPENDENT_AMBULATORY_CARE_PROVIDER_SITE_OTHER): Payer: Medicare Other | Admitting: *Deleted

## 2011-07-07 DIAGNOSIS — D649 Anemia, unspecified: Secondary | ICD-10-CM

## 2011-07-07 DIAGNOSIS — I6529 Occlusion and stenosis of unspecified carotid artery: Secondary | ICD-10-CM

## 2011-07-07 LAB — CBC WITH DIFFERENTIAL/PLATELET
Basophils Absolute: 0.1 10*3/uL (ref 0.0–0.1)
HCT: 30.6 % — ABNORMAL LOW (ref 39.0–52.0)
Lymphs Abs: 1 10*3/uL (ref 0.7–4.0)
MCV: 97.3 fl (ref 78.0–100.0)
Monocytes Absolute: 0.4 10*3/uL (ref 0.1–1.0)
Platelets: 170 10*3/uL (ref 150.0–400.0)
RDW: 13.6 % (ref 11.5–14.6)

## 2011-07-13 NOTE — Discharge Summary (Signed)
Patient seen daily and examined.  He wants dc today and will follow up closely in the office.  TS

## 2011-07-14 ENCOUNTER — Telehealth: Payer: Self-pay | Admitting: Surgery

## 2011-07-14 NOTE — Telephone Encounter (Addendum)
Message copied by Shari Prows on Mon Jul 14, 2011  2:53 PM ------      Message from: Melene Plan      Created: Mon Jul 14, 2011 12:43 PM       Does he have an appt?      ----- Message -----         From: Nada Libman, MD         Sent: 07/13/2011  10:06 PM           To: Melene Plan, RN            Got  A note from Dr Riley Kill to get him in to see me ASAP.  Just forwarding it to you to make sure it happens.Thanks       I scheduled an appt w/ [facility] on 08/04/11 at 10:30am for a repeat carotid doppler per carolyn. Patient aware of this and I also mailed paperwork to pt. Drinda Butts tobin

## 2011-07-15 ENCOUNTER — Other Ambulatory Visit: Payer: Self-pay

## 2011-07-15 ENCOUNTER — Ambulatory Visit (INDEPENDENT_AMBULATORY_CARE_PROVIDER_SITE_OTHER): Payer: Medicare Other | Admitting: Nurse Practitioner

## 2011-07-15 ENCOUNTER — Encounter: Payer: Self-pay | Admitting: Nurse Practitioner

## 2011-07-15 VITALS — BP 124/64 | HR 57 | Ht 66.0 in | Wt 164.8 lb

## 2011-07-15 DIAGNOSIS — I6529 Occlusion and stenosis of unspecified carotid artery: Secondary | ICD-10-CM

## 2011-07-15 DIAGNOSIS — I2510 Atherosclerotic heart disease of native coronary artery without angina pectoris: Secondary | ICD-10-CM

## 2011-07-15 DIAGNOSIS — D649 Anemia, unspecified: Secondary | ICD-10-CM | POA: Insufficient documentation

## 2011-07-15 DIAGNOSIS — I4891 Unspecified atrial fibrillation: Secondary | ICD-10-CM

## 2011-07-15 DIAGNOSIS — IMO0002 Reserved for concepts with insufficient information to code with codable children: Secondary | ICD-10-CM

## 2011-07-15 DIAGNOSIS — I10 Essential (primary) hypertension: Secondary | ICD-10-CM

## 2011-07-15 DIAGNOSIS — I779 Disorder of arteries and arterioles, unspecified: Secondary | ICD-10-CM | POA: Insufficient documentation

## 2011-07-15 LAB — CBC WITH DIFFERENTIAL/PLATELET
Basophils Absolute: 0 10*3/uL (ref 0.0–0.1)
Eosinophils Relative: 1.7 % (ref 0.0–5.0)
MCV: 97.1 fl (ref 78.0–100.0)
Monocytes Absolute: 0.5 10*3/uL (ref 0.1–1.0)
Monocytes Relative: 9.6 % (ref 3.0–12.0)
Neutrophils Relative %: 63.3 % (ref 43.0–77.0)
Platelets: 169 10*3/uL (ref 150.0–400.0)
RDW: 13.5 % (ref 11.5–14.6)
WBC: 5 10*3/uL (ref 4.5–10.5)

## 2011-07-15 NOTE — Progress Notes (Signed)
Patient Name: Levi Robinson Date of Encounter: 07/15/2011  Primary Care Provider:  No primary provider on file. Primary Cardiologist:  T. Riley Kill, MD  Patient Profile  78 y/o male w/ recent nstemi and DES to LCX who presents for f/u.  Problem List   Past Medical History  Diagnosis Date  . Hypertension   . Diabetes mellitus   . CAD (coronary artery disease)     a. NSTEMI s/p complex PCI to prox Cx 07/03/11  . Encephalopathy     AMS during 06/2011 hospitalization - MRI of brain without infarct but did show R carotid dz, abnl EEG with nonspecific encephalopathy, felt secondary to drugs  . Carotid arterial disease     a. By MRI 05/2011;  b.   . Anemia     a. Felt to be iatrogenic from procedures/sticks 06/2011  . Transient atrial fibrillation or flutter     Brief WCT during hospitalization 06/2011 felt to be afib with abberation, not a good coumadin candidate   Past Surgical History  Procedure Date  . Appendectomy     Allergies  Allergies  Allergen Reactions  . Ativan (Lorazepam) Other (See Comments)    agitation    HPI  78 y/o male with the above problem list.  Pt was admitted to [facility] in mid June with chest pain and ruled in for NSTEMI.  He was also noted to have mild alteration in mental status.  He underwent diagnostic cath revealing LCX dzs and nl EF.  PCI was delayed until after neuro eval for AMS.  Brain MRI revealed no acute findings but did suggest occlusion of the RICA and LICA dzs.  AMS was felt to be encephalopathy 2/2 meds.  He was taken back to the cath lab and underwent DES to the LCX.  Since d/c, he has done well.  He reports compliance with his meds and has resumed his usual activities without chest pain or dyspnea.  He denies PND, orthopnea, dizziness, syncope, edema, or early satiety.  Since discharge, he did undergo ultrasound of his neck to evaluate carotid disease.  This did confirm total occlusion of the right internal carotid artery with severe stenosis of  the left internal carotid artery.  He has followup with vascular surgery in 2 weeks.  He has had no symptoms of unilateral weakness, aphasia, dysphagia, or other neurologic deficits.  Home Medications  Prior to Admission medications   Medication Sig Start Date End Date Taking? Authorizing Provider  aspirin 81 MG tablet Take 1 tablet (81 mg total) by mouth daily. 07/04/11  Yes Dayna N Dunn, PA  carvedilol (COREG) 6.25 MG tablet Take 1 tablet (6.25 mg total) by mouth 2 (two) times daily with a meal. 07/04/11 07/03/12 Yes Dayna N Dunn, PA  clopidogrel (PLAVIX) 75 MG tablet Take 1 tablet (75 mg total) by mouth daily with breakfast. 07/04/11 07/03/12 Yes Dayna N Dunn, PA  insulin detemir (LEVEMIR) 100 UNIT/ML injection Inject 20 Units into the skin daily. 07/04/11  Yes Dayna N Dunn, PA  isosorbide mononitrate (IMDUR) 30 MG 24 hr tablet Take 1 tablet (30 mg total) by mouth daily. 07/04/11 07/03/12 Yes Dayna N Dunn, PA  levothyroxine (SYNTHROID, LEVOTHROID) 25 MCG tablet Take 25 mcg by mouth daily.   Yes Historical Provider, MD  lisinopril-hydrochlorothiazide (PRINZIDE,ZESTORETIC) 10-12.5 MG per tablet Take 1 tablet by mouth daily.   Yes Historical Provider, MD  nitroGLYCERIN (NITROSTAT) 0.4 MG SL tablet Place 1 tablet (0.4 mg total) under the tongue every 5 (five)  minutes x 3 doses as needed for chest pain. 07/04/11 07/03/12 Yes Dayna N Dunn, PA  omega-3 acid ethyl esters (LOVAZA) 1 G capsule Take 1 g by mouth 3 (three) times daily.   Yes Historical Provider, MD  pantoprazole (PROTONIX) 40 MG tablet Take 40 mg by mouth daily.   Yes Historical Provider, MD  simvastatin (ZOCOR) 40 MG tablet Take 20 mg by mouth at bedtime.   Yes Historical Provider, MD    Review of Systems  No chest pain, sob, n, v, dizziness, syncope, edema, early satiety, dysuria, dark stools, blood in stools, diarrhea, rash/skin changes, fevers, chills, wt loss/gain.  Otherwise all systems reviewed and negative.  Physical Exam  Blood  pressure 124/64, pulse 57, height 5\' 6"  (1.676 m), weight 164 lb 12.8 oz (74.753 kg).  General: Pleasant, NAD Psych: Normal affect. Neuro: Alert and oriented X 3. Moves all extremities spontaneously. HEENT: Normal  Neck: Supple without JVD.  He has soft bilateral carotid bruits right slightly lower than left. Lungs:  Resp regular and unlabored, CTA. Heart: RRR no s3, s4.  2/6 systolic murmur @ rusb. Abdomen: Soft, non-tender, non-distended, BS + x 4.  Extremities: No clubbing, cyanosis or edema. DP/PT/Radials 2+ and equal bilaterally.  Accessory Clinical Findings  ECG - sinus bradycardia, 56, right bundle branch block, left anterior fascicular block.  No acute ST or T changes.  Assessment & Plan  1.  Coronary artery disease: Status post recent non-ST elevation MI with stenting of the left circumflex.  He has had no recurrent chest discomfort and has resumed his usual activities without significant limitations.  He remains on aspirin, Plavix, beta blocker, and nitrate, ACE inhibitor, and statin therapy.  2.  Hypertension: Stable.  3.  Hyperlipidemia:  He is on long-term statin therapy with an LDL of 75 in mid June.  4.  Carotid arterial disease:  He has occluded right internal carotid artery stenosis and severe stenosis of the left internal carotid artery.  He will follow up with Dr. Myra Gianotti on July 22.  Remains on aspirin, statin, and Plavix therapy.  5.  History of atrial fibrillation: Patient had wide complex tachycardia believed to be a fibrillation with aberrant conduction while hospitalized.  This was apparently short-lived and patient is not felt to be a good anticoagulation candidate.  He has not had any further palpitations.  He remains on beta blocker and aspirin therapy.   Nicolasa Ducking, NP 07/15/2011, 3:16 PM

## 2011-07-15 NOTE — Patient Instructions (Addendum)
Your physician recommends that you schedule a follow-up appointment in: 3 months with Dr. Stuckey 

## 2011-08-01 ENCOUNTER — Encounter: Payer: Self-pay | Admitting: Surgery

## 2011-08-04 ENCOUNTER — Encounter: Payer: Self-pay | Admitting: Surgery

## 2011-08-04 ENCOUNTER — Ambulatory Visit (INDEPENDENT_AMBULATORY_CARE_PROVIDER_SITE_OTHER): Payer: Medicare Other | Admitting: Surgery

## 2011-08-04 ENCOUNTER — Other Ambulatory Visit (INDEPENDENT_AMBULATORY_CARE_PROVIDER_SITE_OTHER): Payer: Medicare Other | Admitting: *Deleted

## 2011-08-04 VITALS — BP 150/59 | HR 66 | Temp 98.0°F | Ht 65.0 in | Wt 168.0 lb

## 2011-08-04 DIAGNOSIS — I6529 Occlusion and stenosis of unspecified carotid artery: Secondary | ICD-10-CM

## 2011-08-04 NOTE — Progress Notes (Signed)
[facility]   Patient name: Levi Robinson MRN: [id number] DOB: January 22, 1921 Sex: male   Referred by: Dr Riley Kill  Reason for referral:  Chief Complaint  Patient presents with  . Carotid    New pt, carotid stenosis/ Dr. Riley Kill    HISTORY OF PRESENT ILLNESS: The patient is referred today for further evaluation of his carotid disease. He was recently hospitalized for AMI. He underwent coronary stenting. From that perspective he appears to be doing well. He is without chest pain today. During his workup he was found to have significant carotid disease. This was initially detected by MRI which was done for acute mental status changes. He has an occlusion of his right internal carotid artery. Ultrasound has also identified a high-grade left carotid stenosis. The patient denies having any symptoms. Specifically, he denies numbness or weakness in either extremity. He denies amaurosis fugax. He denies slurred speech.  The patient has a 7-8 year history of diabetes. He states that his blood sugars ranged from 120-140. He is also medically managed for his hypertension. His systolic blood pressure is in the 02/03/1948 range. He is taking a statin for hypercholesterolemia. He is on double H. antiplatelet therapy consisting of aspirin and Plavix  Past Medical History  Diagnosis Date  . Hypertension   . Diabetes mellitus   . CAD (coronary artery disease)     a. NSTEMI s/p complex PCI to prox Cx 07/03/11  . Encephalopathy     AMS during 06/2011 hospitalization - MRI of brain without infarct but did show R carotid dz, abnl EEG with nonspecific encephalopathy, felt secondary to drugs  . Carotid arterial disease     a. By MRI 05/2011;  b. 06/2011 Carotid U/S: LICA 80-99%, RICA 100%, patent vertebrals.  . Anemia     a. Felt to be iatrogenic from procedures/sticks 06/2011  . Transient atrial fibrillation or flutter     Brief WCT during hospitalization 06/2011 felt to be afib with  abberation, not a good coumadin candidate  . Myocardial infarction     Past Surgical History  Procedure Date  . Thumb surgery 1975    left  . Appendectomy 1948    History   Social History  . Marital Status: Widowed    Spouse Name: N/A    Number of Children: N/A  . Years of Education: N/A   Occupational History  . Not on file.   Social History Main Topics  . Smoking status: Former Smoker    Types: Cigarettes    Quit date: 01/13/1986  . Smokeless tobacco: Not on file  . Alcohol Use: No  . Drug Use: No  . Sexually Active:    Other Topics Concern  . Not on file   Social History Narrative  . No narrative on file    Family History  Problem Relation Age of Onset  . Diabetes Mother   . Cancer Father   . Cancer Sister   . Cancer Brother   . Cancer Daughter     Allergies as of 08/04/2011 - Review Complete 08/04/2011  Allergen Reaction Noted  . Ativan (lorazepam) Other (See Comments) 06/30/2011    Current Outpatient Prescriptions on File Prior to Visit  Medication Sig Dispense Refill  . aspirin 81 MG tablet Take 1 tablet (81 mg total) by mouth daily.      . carvedilol (COREG) 6.25 MG tablet Take 1 tablet (6.25 mg total) by mouth 2 (two) times daily with a meal.  60 tablet  6  . clopidogrel (PLAVIX) 75 MG tablet Take 1 tablet (75 mg total) by mouth daily with breakfast.  30 tablet  6  . insulin detemir (LEVEMIR) 100 UNIT/ML injection Inject 20 Units into the skin daily.      . isosorbide mononitrate (IMDUR) 30 MG 24 hr tablet Take 1 tablet (30 mg total) by mouth daily.  30 tablet  6  . levothyroxine (SYNTHROID, LEVOTHROID) 25 MCG tablet Take 25 mcg by mouth daily.      Marland Kitchen lisinopril-hydrochlorothiazide (PRINZIDE,ZESTORETIC) 10-12.5 MG per tablet Take 1 tablet by mouth daily.      . nitroGLYCERIN (NITROSTAT) 0.4 MG SL tablet Place 1 tablet (0.4 mg total) under the tongue every 5 (five) minutes x 3 doses as needed for chest pain.  25 tablet  4  . omega-3 acid ethyl  esters (LOVAZA) 1 G capsule Take 1 g by mouth 3 (three) times daily.      . pantoprazole (PROTONIX) 40 MG tablet Take 40 mg by mouth daily.      . simvastatin (ZOCOR) 40 MG tablet Take 20 mg by mouth at bedtime.         REVIEW OF SYSTEMS: Cardiovascular: No chest pain, chest pressure, palpitations, orthopnea, or dyspnea on exertion. No claudication or rest pain,  No history of DVT or phlebitis. Left leg swelling recent MI Pulmonary: No productive cough, asthma or wheezing. Neurologic: No weakness, paresthesias, aphasia, or amaurosis. No dizziness. Hematologic: No bleeding problems or clotting disorders. Musculoskeletal: No joint pain or joint swelling. Gastrointestinal: No blood in stool or hematemesis Genitourinary: No dysuria or hematuria. Psychiatric:: No history of major depression. Integumentary: No rashes or ulcers. Constitutional: No fever or chills.  PHYSICAL EXAMINATION: General: The patient appears their stated age.  Vital signs are BP 150/59  Pulse 66  Temp 98 F (36.7 C) (Oral)  Ht 5\' 5"  (1.651 m)  Wt 168 lb (76.204 kg)  BMI 27.96 kg/m2  SpO2 100% HEENT:  No gross abnormalities Pulmonary: Respirations are non-labored Abdomen: Soft and non-tender  Musculoskeletal: There are no major deformities.   Neurologic: No focal weakness or paresthesias are detected, Skin: There are no ulcer or rashes noted. Psychiatric: The patient has normal affect. Cardiovascular: There is a regular rate and rhythm without significant murmur appreciated., left carotid bruit.  Left leg swelling  Diagnostic Studies: Ultrasound was ordered and reviewed this shows a occluded right carotid artery with 80-99% left carotid stenosis. He has a normal carotid past the bifurcation. The bifurcation is noted hyaloid.  Outside Studies/Documentation Historical records were reviewed.  They showed occluded right carotid , high-grade left carotid stenosis  Medication Changes: None  Assessment:    High-grade left carotid stenosis in the setting of an occluded right carotid artery Plan: I discussed our options at this time which include endarterectomy versus medical management. Due to his age I did not thank you be a great candidate for carotid stenting although that could be something we consider. I discussed the risks and benefits of carotid endarterectomy which include but are not limited to the risk of stroke, the risk of nerve injury, the risk of bleeding. At this time I educated the patient and his family regarding our options and what to do should we wished to proceed.  The contact me after they discussed this matter further to schedule carotid endarterectomy. I will need to get formal clearance from Dr. Riley Kill to proceed. Obviously he will be high risk given his recent MI. He may benefit from waiting  a couple of months before we do his operation. I will not stop his Plavix for the procedure if we proceed.     Jorge Ny, M.D. [facility] Office: [phone] Pager:  [phone]

## 2011-08-08 ENCOUNTER — Telehealth: Payer: Self-pay | Admitting: Cardiology

## 2011-08-08 ENCOUNTER — Emergency Department (HOSPITAL_COMMUNITY): Payer: Medicare Other

## 2011-08-08 ENCOUNTER — Encounter (HOSPITAL_COMMUNITY): Payer: Self-pay | Admitting: Emergency Medicine

## 2011-08-08 ENCOUNTER — Emergency Department (HOSPITAL_COMMUNITY)
Admission: EM | Admit: 2011-08-08 | Discharge: 2011-08-08 | Disposition: A | Discharge status: Home / Self Care | Payer: Medicare Other | Attending: Emergency Medicine | Admitting: Emergency Medicine

## 2011-08-08 DIAGNOSIS — I10 Essential (primary) hypertension: Secondary | ICD-10-CM | POA: Insufficient documentation

## 2011-08-08 DIAGNOSIS — I252 Old myocardial infarction: Secondary | ICD-10-CM | POA: Insufficient documentation

## 2011-08-08 DIAGNOSIS — R42 Dizziness and giddiness: Secondary | ICD-10-CM | POA: Insufficient documentation

## 2011-08-08 DIAGNOSIS — E119 Type 2 diabetes mellitus without complications: Secondary | ICD-10-CM | POA: Insufficient documentation

## 2011-08-08 DIAGNOSIS — R609 Edema, unspecified: Secondary | ICD-10-CM | POA: Insufficient documentation

## 2011-08-08 DIAGNOSIS — I2510 Atherosclerotic heart disease of native coronary artery without angina pectoris: Secondary | ICD-10-CM | POA: Insufficient documentation

## 2011-08-08 DIAGNOSIS — Z794 Long term (current) use of insulin: Secondary | ICD-10-CM | POA: Insufficient documentation

## 2011-08-08 DIAGNOSIS — Z9089 Acquired absence of other organs: Secondary | ICD-10-CM | POA: Insufficient documentation

## 2011-08-08 LAB — BASIC METABOLIC PANEL
CO2: 24 mEq/L (ref 19–32)
Chloride: 104 mEq/L (ref 96–112)
Glucose, Bld: 214 mg/dL — ABNORMAL HIGH (ref 70–99)
Potassium: 4.2 mEq/L (ref 3.5–5.1)
Sodium: 138 mEq/L (ref 135–145)

## 2011-08-08 LAB — URINALYSIS, ROUTINE W REFLEX MICROSCOPIC
Glucose, UA: 100 mg/dL — AB
Hgb urine dipstick: NEGATIVE
Leukocytes, UA: NEGATIVE
Specific Gravity, Urine: 1.016 (ref 1.005–1.030)
pH: 5.5 (ref 5.0–8.0)

## 2011-08-08 LAB — CBC WITH DIFFERENTIAL/PLATELET
Basophils Absolute: 0 10*3/uL (ref 0.0–0.1)
Eosinophils Relative: 2 % (ref 0–5)
Lymphocytes Relative: 25 % (ref 12–46)
Lymphs Abs: 1.1 10*3/uL (ref 0.7–4.0)
MCV: 93.7 fL (ref 78.0–100.0)
Neutro Abs: 2.9 10*3/uL (ref 1.7–7.7)
Neutrophils Relative %: 67 % (ref 43–77)
Platelets: 130 10*3/uL — ABNORMAL LOW (ref 150–400)
RBC: 3.48 MIL/uL — ABNORMAL LOW (ref 4.22–5.81)
WBC: 4.3 10*3/uL (ref 4.0–10.5)

## 2011-08-08 NOTE — ED Notes (Signed)
Pt to xray

## 2011-08-08 NOTE — Telephone Encounter (Signed)
Spoke with pt granddaughter, pt is having dizziness, it started when he got up this am. He is dizzy bending over, a little when he turns his head and a little while sitting in the chair. They do not have a way to check his BP at home. Discussed with dr Riley Kill, pt is high risk and has a lot of problems. Per dr Riley Kill the pt will go to the ER for eval. cardmaster made aware.

## 2011-08-08 NOTE — ED Notes (Signed)
Pt reports woke up today with dizziness. Pt denies any other symptoms. Pt of Dr Riley Kill.

## 2011-08-08 NOTE — Telephone Encounter (Signed)
New problem:  Patient calling C/O dizziness. Took am medication.

## 2011-08-08 NOTE — Telephone Encounter (Signed)
Follow-up:    [name] called back about his dizziness and also wants to speak about the procedue that Dr. Riley Kill advised the patient to have.  Please call back

## 2011-08-08 NOTE — Telephone Encounter (Signed)
Pt was called see prior note

## 2011-08-08 NOTE — ED Notes (Signed)
Pt states he has no history of dizziness, states he was advised to come in to ED by MD Riley Kill, states he is two months post MI and has carotid artery blockage.

## 2011-08-08 NOTE — Telephone Encounter (Signed)
I have called 4 x and the phone rings once then no sound. Will continue to try.

## 2011-08-08 NOTE — ED Provider Notes (Signed)
History     CSN: [id number]  Arrival date & time 08/08/11  1041   First MD Initiated Contact with Patient 08/08/11 1209      Chief Complaint  Patient presents with  . Dizziness    (Consider location/radiation/quality/duration/timing/severity/associated sxs/prior treatment) HPI Comments: Levi Robinson is a 78 y.o. Male here for evaluation of dizziness that started this morning. It occurs only when he bends over. He feels like he will fall over when bending over. He has not fallen. No associated chest pain, shortness of breath, headache, back pain , or abdominal pain. He has never had this happen before. He ate breakfast earlier, and is hungry now. He's been using his medicines as prescribed. He had a myocardial infarction and required, a coronary stent 2 months ago. He saw a vascular Dr. this week to be evaluated for carotid artery occlusion. The vascular Dr. plans on doing an intervention. It has not been planned yet.    The history is provided by the patient.    Past Medical History  Diagnosis Date  . Hypertension   . Diabetes mellitus   . CAD (coronary artery disease)     a. NSTEMI s/p complex PCI to prox Cx 07/03/11  . Encephalopathy     AMS during 06/2011 hospitalization - MRI of brain without infarct but did show R carotid dz, abnl EEG with nonspecific encephalopathy, felt secondary to drugs  . Carotid arterial disease     a. By MRI 05/2011;  b. 06/2011 Carotid U/S: LICA 80-99%, RICA 100%, patent vertebrals.  . Anemia     a. Felt to be iatrogenic from procedures/sticks 06/2011  . Transient atrial fibrillation or flutter     Brief WCT during hospitalization 06/2011 felt to be afib with abberation, not a good coumadin candidate  . Myocardial infarction     Past Surgical History  Procedure Date  . Thumb surgery 1975    left  . Appendectomy 1948    Family History  Problem Relation Age of Onset  . Diabetes Mother   . Cancer Father   . Cancer Sister   . Cancer Brother   .  Cancer Daughter     History  Substance Use Topics  . Smoking status: Former Smoker    Types: Cigarettes    Quit date: 01/13/1986  . Smokeless tobacco: Not on file  . Alcohol Use: No      Review of Systems  All other systems reviewed and are negative.    Allergies  Ativan  Home Medications   Current Outpatient Rx  Name Route Sig Dispense Refill  . ASPIRIN 81 MG PO TABS Oral Take 1 tablet (81 mg total) by mouth daily.    Marland Kitchen CARVEDILOL 6.25 MG PO TABS Oral Take 1 tablet (6.25 mg total) by mouth 2 (two) times daily with a meal. 60 tablet 6  . CLOPIDOGREL BISULFATE 75 MG PO TABS Oral Take 1 tablet (75 mg total) by mouth daily with breakfast. 30 tablet 6  . INSULIN DETEMIR 100 UNIT/ML [place] SOLN Subcutaneous Inject 12-38 Units into the skin 2 (two) times daily. 38 units with breakfast and 12 units with dinner    . ISOSORBIDE MONONITRATE ER 30 MG PO TB24 Oral Take 30 mg by mouth daily after lunch.    Marland Kitchen KETOCONAZOLE 2 % EX CREA Topical Apply 1 application topically as needed. For rash    . LEVOTHYROXINE SODIUM 25 MCG PO TABS Oral Take 25 mcg by mouth daily.    Marland Kitchen LISINOPRIL  10 MG PO TABS Oral Take 10 mg by mouth daily after lunch.    Marland Kitchen NITROGLYCERIN 0.4 MG SL SUBL Sublingual Place 1 tablet (0.4 mg total) under the tongue every 5 (five) minutes x 3 doses as needed for chest pain. 25 tablet 4  . OMEGA-3-ACID ETHYL ESTERS 1 G PO CAPS Oral Take 1 g by mouth 3 (three) times daily.    Marland Kitchen PANTOPRAZOLE SODIUM 40 MG PO TBEC Oral Take 40 mg by mouth daily after lunch.     Marland Kitchen SIMVASTATIN 40 MG PO TABS Oral Take 20 mg by mouth at bedtime.      BP 121/51  Pulse 60  Temp 98.2 F (36.8 C) (Oral)  Resp 16  SpO2 100%  Physical Exam  Nursing note and vitals reviewed. Constitutional: He is oriented to person, place, and time. He appears well-developed and well-nourished.  HENT:  Head: Normocephalic and atraumatic.  Right Ear: External ear normal.  Left Ear: External ear normal.  Eyes:  Conjunctivae and EOM are normal. Pupils are equal, round, and reactive to light.  Neck: Normal range of motion and phonation normal. Neck supple.  Cardiovascular: Normal rate, regular rhythm, normal heart sounds and intact distal pulses.   Pulmonary/Chest: Effort normal and breath sounds normal. He exhibits no bony tenderness.  Abdominal: Soft. Normal appearance. There is no tenderness.  Musculoskeletal: Normal range of motion. He exhibits edema (Left leg 2+, stated, to be chronic).  Neurological: He is alert and oriented to person, place, and time. He has normal strength. No cranial nerve deficit or sensory deficit. He exhibits normal muscle tone. Coordination normal.  Skin: Skin is warm, dry and intact.  Psychiatric: He has a normal mood and affect. His behavior is normal. Judgment and thought content normal.    ED Course  Procedures (including critical care time)   Date: 08/08/2011  Rate: 55  Rhythm: normal sinus rhythm  QRS Axis: normal  Intervals: normal  ST/T Wave abnormalities: normal  Conduction Disutrbances:right bundle branch block  Narrative Interpretation:   Old EKG Reviewed: unchanged  15:36 Reevaluation: Ambulation trial. He was able to walk. The hall without difficulty. Recheck her vital signs have been reassuring.  Labs Reviewed  CBC WITH DIFFERENTIAL - Abnormal; Notable for the following:    RBC 3.48 (*)     Hemoglobin 11.1 (*)     HCT 32.6 (*)     Platelets 130 (*)     All other components within normal limits  BASIC METABOLIC PANEL - Abnormal; Notable for the following:    Glucose, Bld 214 (*)     GFR calc non Af Amer 53 (*)     GFR calc Af Amer 61 (*)     All other components within normal limits  URINALYSIS, ROUTINE W REFLEX MICROSCOPIC - Abnormal; Notable for the following:    Glucose, UA 100 (*)     All other components within normal limits  TROPONIN I  URINE CULTURE   Dg Chest 2 View  08/08/2011  *RADIOLOGY REPORT*  Clinical Data: Dizziness.  CHEST  - 2 VIEW  Comparison: Chest 06/28/2011.  Findings: Streaky left basilar airspace opacity is identified.  The degree of opacity in the left base is improved.  Right lung is clear.  Heart size is normal.  IMPRESSION: Streaky left basilar airspace opacity could be due to atelectasis or pneumonia.  Aeration in the left base appears improved since the prior study.  Original Report Authenticated By: Bernadene Bell. Maricela Curet, M.D.  1. Dizziness       MDM  Nonspecific dizziness; doubt ACS, CVA, vascular insufficiency, metabolic instability. Pt is stable for d/c.   Plan: Home Medications- usual; Home Treatments- rest, avoid bending over; Recommended follow up- Cardiology and vascular surgery as scheduled.        Flint Melter, MD 08/08/11 [phone]

## 2011-08-08 NOTE — ED Notes (Signed)
EDP at bedside to assess patient.  

## 2011-08-08 NOTE — ED Notes (Addendum)
Pt taking home medication per MD Effie Shy, pt took fish oil 1 tab, protonix 40mg , lisinopril 10mg , and isosorbide 30mg  of home pills. MD Effie Shy gave patient permission to take these medications on his home schedule. Pt eating a sandwich at this time, will obtain orthostatic VS once patient is finished.

## 2011-08-08 NOTE — ED Notes (Signed)
Walked patient did fine walking, put to sitting position

## 2011-08-10 LAB — URINE CULTURE: Colony Count: 1000

## 2011-08-11 NOTE — Procedures (Unsigned)
CAROTID DUPLEX EXAM  INDICATION:  Very tight critical stenosis found on outside facility's exam.  HISTORY: Diabetes:  Yes Cardiac:  Yes Hypertension:  Yes Smoking:  Previous Previous Surgery: CV History: Amaurosis Fugax No, Paresthesias No, Hemiparesis No                                      RIGHT             LEFT Brachial systolic pressure:         140               132 Brachial Doppler waveforms:         WNL               WNL Vertebral direction of flow:        Antegrade         Antegrade DUPLEX VELOCITIES (cm/sec) CCA peak systolic                   Highly resistant  89 ECA peak systolic                   175               427 ICA peak systolic                   0                 326 ICA end diastolic                   0                 118 PLAQUE MORPHOLOGY:                  Heterogeneous     Heterogeneous PLAQUE AMOUNT:                      Occluded ICA      Severe PLAQUE LOCATION:                    ECA/ICA           CCA/ECA/ICA  IMPRESSION: 1. Confirmed occlusion of right internal carotid artery; common     carotid artery is highly resistant with no Doppler signal within     the internal carotid artery.  The external carotid artery does not     appear to have internalized flow. 2. 80%99% left internal carotid artery stenosis.  Velocities may be     elevated due to contralateral occlusion however marked post     stenotic turbulence is present which reinforces the likelihood of     critical plaque.  Vessel is patent beyond the lesion. 3. Severe external carotid artery stenosis on the left. 4. Bilateral vertebral arteries are antegrade.      ___________________________________________ V. Charlena Cross, MD  LT/MEDQ  D:  08/05/2011  T:  08/05/2011  Job:  [id number]

## 2011-08-26 ENCOUNTER — Emergency Department (HOSPITAL_COMMUNITY)
Admission: EM | Admit: 2011-08-26 | Discharge: 2011-08-26 | Disposition: A | Discharge status: Home / Self Care | Payer: Medicare Other | Attending: Emergency Medicine | Admitting: Emergency Medicine

## 2011-08-26 ENCOUNTER — Emergency Department (HOSPITAL_COMMUNITY): Payer: Medicare Other

## 2011-08-26 ENCOUNTER — Encounter (HOSPITAL_COMMUNITY): Payer: Self-pay

## 2011-08-26 DIAGNOSIS — Z794 Long term (current) use of insulin: Secondary | ICD-10-CM | POA: Insufficient documentation

## 2011-08-26 DIAGNOSIS — E1169 Type 2 diabetes mellitus with other specified complication: Secondary | ICD-10-CM | POA: Insufficient documentation

## 2011-08-26 DIAGNOSIS — W19XXXA Unspecified fall, initial encounter: Secondary | ICD-10-CM | POA: Insufficient documentation

## 2011-08-26 DIAGNOSIS — I10 Essential (primary) hypertension: Secondary | ICD-10-CM | POA: Insufficient documentation

## 2011-08-26 DIAGNOSIS — Z7982 Long term (current) use of aspirin: Secondary | ICD-10-CM | POA: Insufficient documentation

## 2011-08-26 DIAGNOSIS — I252 Old myocardial infarction: Secondary | ICD-10-CM | POA: Insufficient documentation

## 2011-08-26 DIAGNOSIS — Y9229 Other specified public building as the place of occurrence of the external cause: Secondary | ICD-10-CM | POA: Insufficient documentation

## 2011-08-26 DIAGNOSIS — R55 Syncope and collapse: Secondary | ICD-10-CM

## 2011-08-26 DIAGNOSIS — Z9089 Acquired absence of other organs: Secondary | ICD-10-CM | POA: Insufficient documentation

## 2011-08-26 DIAGNOSIS — Z79899 Other long term (current) drug therapy: Secondary | ICD-10-CM | POA: Insufficient documentation

## 2011-08-26 DIAGNOSIS — E162 Hypoglycemia, unspecified: Secondary | ICD-10-CM

## 2011-08-26 DIAGNOSIS — I2510 Atherosclerotic heart disease of native coronary artery without angina pectoris: Secondary | ICD-10-CM | POA: Insufficient documentation

## 2011-08-26 LAB — URINALYSIS, MICROSCOPIC ONLY
Bilirubin Urine: NEGATIVE
Glucose, UA: NEGATIVE mg/dL
Hgb urine dipstick: NEGATIVE
Leukocytes, UA: NEGATIVE
Protein, ur: NEGATIVE mg/dL
Specific Gravity, Urine: 1.01 (ref 1.005–1.030)
pH: 5.5 (ref 5.0–8.0)

## 2011-08-26 LAB — CBC WITH DIFFERENTIAL/PLATELET
Basophils Relative: 0 % (ref 0–1)
Hemoglobin: 11.6 g/dL — ABNORMAL LOW (ref 13.0–17.0)
Lymphocytes Relative: 19 % (ref 12–46)
Lymphs Abs: 1.2 10*3/uL (ref 0.7–4.0)
Monocytes Relative: 7 % (ref 3–12)
Neutro Abs: 4.6 10*3/uL (ref 1.7–7.7)
Neutrophils Relative %: 72 % (ref 43–77)
RBC: 3.7 MIL/uL — ABNORMAL LOW (ref 4.22–5.81)
WBC: 6.3 10*3/uL (ref 4.0–10.5)

## 2011-08-26 LAB — GLUCOSE, CAPILLARY
Glucose-Capillary: 68 mg/dL — ABNORMAL LOW (ref 70–99)
Glucose-Capillary: 73 mg/dL (ref 70–99)

## 2011-08-26 LAB — POCT I-STAT, CHEM 8
Calcium, Ion: 1.22 mmol/L (ref 1.13–1.30)
Chloride: 107 mEq/L (ref 96–112)
HCT: 35 % — ABNORMAL LOW (ref 39.0–52.0)

## 2011-08-26 MED ORDER — DEXTROSE 50 % IV SOLN
INTRAVENOUS | Status: AC
  Filled 2011-08-26: qty 50

## 2011-08-26 NOTE — ED Notes (Signed)
Went to Patient room to do ECG and try to collect a urine sample.  Patient had gone to Xray

## 2011-08-26 NOTE — ED Notes (Signed)
Patient is ambulatory without any problems. 

## 2011-08-26 NOTE — ED Provider Notes (Signed)
History     CSN: [id number]  Arrival date & time 08/26/11  1359   First MD Initiated Contact with Patient 08/26/11 1401      Chief Complaint  Patient presents with  . Fall    (Consider location/radiation/quality/duration/timing/severity/associated sxs/prior treatment) Patient is a 78 y.o. male presenting with fall and syncope. The history is provided by the EMS personnel and the patient.  Fall Incident: while at [employer]. The point of impact was the head. The pain is at a severity of 0/10. The patient is experiencing no pain. There was no drug use involved in the accident. There was no alcohol use involved in the accident. Associated symptoms include loss of consciousness. Pertinent negatives include no fever, no numbness, no abdominal pain, no nausea, no vomiting and no headaches.  Loss of Consciousness This is a new problem. The current episode started today. Episode frequency: once. The problem has been resolved. Pertinent negatives include no abdominal pain, chest pain, chills, congestion, coughing, fever, headaches, nausea, neck pain, numbness, urinary symptoms, vomiting or weakness. Treatments tried: D50 by EMS. The treatment provided significant relief.    Past Medical History  Diagnosis Date  . Hypertension   . Diabetes mellitus   . CAD (coronary artery disease)     a. NSTEMI s/p complex PCI to prox Cx 07/03/11  . Encephalopathy     AMS during 06/2011 hospitalization - MRI of brain without infarct but did show R carotid dz, abnl EEG with nonspecific encephalopathy, felt secondary to drugs  . Carotid arterial disease     a. By MRI 05/2011;  b. 06/2011 Carotid U/S: LICA 80-99%, RICA 100%, patent vertebrals.  . Anemia     a. Felt to be iatrogenic from procedures/sticks 06/2011  . Transient atrial fibrillation or flutter     Brief WCT during hospitalization 06/2011 felt to be afib with abberation, not a good coumadin candidate  . Myocardial infarction     Past Surgical History    Procedure Date  . Thumb surgery 1975    left  . Appendectomy 1948    Family History  Problem Relation Age of Onset  . Diabetes Mother   . Cancer Father   . Cancer Sister   . Cancer Brother   . Cancer Daughter     History  Substance Use Topics  . Smoking status: Former Smoker    Types: Cigarettes    Quit date: 01/13/1986  . Smokeless tobacco: Not on file  . Alcohol Use: No      Review of Systems  Constitutional: Negative for fever and chills.  HENT: Negative for congestion, rhinorrhea and neck pain.   Eyes: Negative.   Respiratory: Negative for cough and shortness of breath.   Cardiovascular: Positive for syncope. Negative for chest pain.  Gastrointestinal: Negative for nausea, vomiting, abdominal pain, diarrhea and constipation.  Genitourinary: Negative for dysuria, frequency, decreased urine volume and difficulty urinating.  Musculoskeletal: Negative for back pain.  Skin: Negative.   Neurological: Positive for loss of consciousness, syncope and light-headedness. Negative for weakness, numbness and headaches.  Hematological: Bruises/bleeds easily (is on plavix).  Psychiatric/Behavioral: Negative.   All other systems reviewed and are negative.    Allergies  Ativan  Home Medications   Current Outpatient Rx  Name Route Sig Dispense Refill  . ASPIRIN 81 MG PO TABS Oral Take 1 tablet (81 mg total) by mouth daily.    Marland Kitchen CARVEDILOL 6.25 MG PO TABS Oral Take 1 tablet (6.25 mg total) by mouth 2 (two)  times daily with a meal. 60 tablet 6  . CLOPIDOGREL BISULFATE 75 MG PO TABS Oral Take 1 tablet (75 mg total) by mouth daily with breakfast. 30 tablet 6  . INSULIN DETEMIR 100 UNIT/ML [place] SOLN Subcutaneous Inject 12-38 Units into the skin 2 (two) times daily. 38 units with breakfast and 12 units with dinner    . ISOSORBIDE MONONITRATE ER 30 MG PO TB24 Oral Take 30 mg by mouth daily after lunch.    Marland Kitchen KETOCONAZOLE 2 % EX CREA Topical Apply 1 application topically as needed. For  rash    . LEVOTHYROXINE SODIUM 25 MCG PO TABS Oral Take 25 mcg by mouth daily.    Marland Kitchen LISINOPRIL 10 MG PO TABS Oral Take 10 mg by mouth daily after lunch.    Marland Kitchen NITROGLYCERIN 0.4 MG SL SUBL Sublingual Place 1 tablet (0.4 mg total) under the tongue every 5 (five) minutes x 3 doses as needed for chest pain. 25 tablet 4  . OMEGA-3-ACID ETHYL ESTERS 1 G PO CAPS Oral Take 1 g by mouth 3 (three) times daily.    Marland Kitchen PANTOPRAZOLE SODIUM 40 MG PO TBEC Oral Take 40 mg by mouth daily after lunch.     Marland Kitchen SIMVASTATIN 40 MG PO TABS Oral Take 20 mg by mouth at bedtime.      BP 201/75  Pulse 68  Temp 98.2 F (36.8 C) (Oral)  Resp 18  SpO2 100%  Physical Exam  Nursing note and vitals reviewed. Constitutional: He is oriented to person, place, and time. He appears well-developed and well-nourished.  HENT:  Head: Normocephalic and atraumatic.  Right Ear: External ear normal.  Left Ear: External ear normal.  Nose: Nose normal.  Mouth/Throat: Oropharynx is clear and moist.  Eyes: Pupils are equal, round, and reactive to light.  Neck: Neck supple.  Cardiovascular: Normal rate, regular rhythm, normal heart sounds and intact distal pulses.   Pulmonary/Chest: Effort normal.  Abdominal: Soft. He exhibits no distension. There is no tenderness.  Musculoskeletal: He exhibits no edema.  Lymphadenopathy:    He has no cervical adenopathy.  Neurological: He is alert and oriented to person, place, and time. He has normal strength. No cranial nerve deficit or sensory deficit. He exhibits normal muscle tone. GCS eye subscore is 4. GCS verbal subscore is 5. GCS motor subscore is 6.  Skin: Skin is warm and dry. No pallor.    ED Course  Procedures (including critical care time)  Labs Reviewed  GLUCOSE, CAPILLARY - Abnormal; Notable for the following:    Glucose-Capillary 68 (*)     All other components within normal limits  CBC WITH DIFFERENTIAL  TROPONIN I  URINALYSIS, WITH MICROSCOPIC   Dg Chest 2  View  08/26/2011  *RADIOLOGY REPORT*  Clinical Data: History of fall.  Dizziness.  CHEST - 2 VIEW  Comparison: Chest x-ray 08/08/2011.  Findings: Lung volumes are slightly low and there are coarsened interstitial markings throughout the lung bases bilaterally ( left greater than right).  No definite pleural effusions.  Pulmonary vasculature is normal.  Heart size is normal.  Mediastinal contours are unremarkable.  Atherosclerosis in the thoracic aorta.  IMPRESSION: 1.  Low lung volumes with coarsened interstitial markings in the lungs bilaterally (left greater than right).  While this certainly could be related to chronic scarring or potentially even acute infection such as a bronchopneumonia, given the low lung volumes, underlying interstitial lung disease may warrant consideration. Further evaluation with high-resolution chest CT would provide additional diagnostic information  if clinically indicated. 2.  Atherosclerosis.  Original Report Authenticated By: Florencia Reasons, M.D.   Ct Head Wo Contrast  08/26/2011  *RADIOLOGY REPORT*  Clinical Data: Status post fall.  The patient is anticoagulated.  CT HEAD WITHOUT CONTRAST  Technique:  Contiguous axial images were obtained from the base of the skull through the vertex without contrast.  Comparison: Brain MRI 06/30/2011.  Findings: Cortical atrophy and chronic microvascular ischemic change are again seen.  There is no evidence of acute abnormality including infarction, hemorrhage, mass lesion, mass effect, midline shift or abnormal extra-axial fluid collection.  No hydrocephalus or pneumocephalus.  Calvarium intact.  IMPRESSION: No acute finding.  Atrophy and chronic microvascular ischemic change.  Original Report Authenticated By: Bernadene Bell. Maricela Curet, M.D.     Date: 08/26/2011  Rate: 66  Rhythm: normal sinus rhythm  QRS Axis: left  Intervals: normal  ST/T Wave abnormalities: nonspecific T wave changes  Conduction Disutrbances:right bundle branch  block  Narrative Interpretation: Inverted T wave in V1 which is new  Old EKG Reviewed: unchanged   1. Syncope   2. Hypoglycemia       MDM  79 yo male with DM and on plavix presents after syncope episode at [employer]. Per bystanders fell and hit his posterior head. No hematoma or lac. Found to have BS of 35, confusion resolved with D50. Feels asymptomatic now, including no CP, dyspnea, cough, URI, or dysuria. No abd pain. EKG shows flipped T wave in V1 but no ischemic changes. Glucose rose on multiple checks in ED. Is not on any oral hypoglycemics. Warned patient to be careful about checking glucose at home and to hold on insulin if glucose is borderline or too low. Discussed close PCP follow up and importance of eating meals and snacks.        Pricilla Loveless, MD 08/26/11 1620

## 2011-08-26 NOTE — ED Notes (Signed)
Patient was brought in by ambulance S/P fall and hypoglycemia. EMS stated that according to bystanders, the patient passed out and hit his head on the ground. Patient was noted to be hypoglycemic with a CBG of 35. Patient was given 1 ampule of D50 and it went up to 112. Patient denies any pain at present, A/A/Ox4, skin is warm and dry, respiration is even and unlabored.

## 2011-08-27 NOTE — ED Provider Notes (Signed)
I saw and evaluated the patient, reviewed the resident's note and I agree with the findings and plan and agree with their ECG interpretation. Patient presented with syncope and hypoglycemia. Hypoglycemia was likely the cause of syncope. His sugars been stable and patient is here. His renal function is slightly worsened which may be the cause the hypoglycemia. Patient's sugars have been stable here he'll be discharged home with close followup.  Juliet Rude. Rubin Payor, MD 08/27/11 [phone]

## 2011-10-03 ENCOUNTER — Telehealth: Payer: Self-pay | Admitting: *Deleted

## 2011-10-03 NOTE — Telephone Encounter (Signed)
I called Mr Levi Robinson to see if he had seen Dr Riley Kill to get clearance for surgery yet. He said no.He has an appt with Dr Riley Kill on 10-20-11 and said he wanted to wait on surgery. He said he would call us back when ready.I went over signs of a stroke and to go to ER if he had any. He verbalized understanding.

## 2011-10-20 ENCOUNTER — Ambulatory Visit (INDEPENDENT_AMBULATORY_CARE_PROVIDER_SITE_OTHER): Payer: Medicare Other | Admitting: Cardiology

## 2011-10-20 VITALS — BP 194/76 | HR 63 | Ht 65.0 in | Wt 176.0 lb

## 2011-10-20 DIAGNOSIS — I779 Disorder of arteries and arterioles, unspecified: Secondary | ICD-10-CM

## 2011-10-20 DIAGNOSIS — I2510 Atherosclerotic heart disease of native coronary artery without angina pectoris: Secondary | ICD-10-CM

## 2011-10-20 DIAGNOSIS — I6529 Occlusion and stenosis of unspecified carotid artery: Secondary | ICD-10-CM

## 2011-10-20 DIAGNOSIS — I10 Essential (primary) hypertension: Secondary | ICD-10-CM

## 2011-10-20 LAB — LIPID PANEL
Cholesterol: 123 mg/dL (ref 0–200)
LDL Cholesterol: 60 mg/dL (ref 0–99)
Triglycerides: 62 mg/dL (ref 0.0–149.0)
VLDL: 12.4 mg/dL (ref 0.0–40.0)

## 2011-10-20 LAB — CBC WITH DIFFERENTIAL/PLATELET
Basophils Absolute: 0 10*3/uL (ref 0.0–0.1)
Eosinophils Absolute: 0.1 10*3/uL (ref 0.0–0.7)
Lymphocytes Relative: 29 % (ref 12.0–46.0)
MCHC: 33.5 g/dL (ref 30.0–36.0)
Neutrophils Relative %: 61 % (ref 43.0–77.0)
RBC: 3.54 Mil/uL — ABNORMAL LOW (ref 4.22–5.81)
RDW: 14.5 % (ref 11.5–14.6)

## 2011-10-20 LAB — BASIC METABOLIC PANEL
Chloride: 106 mEq/L (ref 96–112)
Potassium: 3.9 mEq/L (ref 3.5–5.1)

## 2011-10-20 LAB — HEPATIC FUNCTION PANEL
Albumin: 3.4 g/dL — ABNORMAL LOW (ref 3.5–5.2)
Total Protein: 7.1 g/dL (ref 6.0–8.3)

## 2011-10-20 NOTE — Assessment & Plan Note (Signed)
His blood pressure is elevated. However, it is likely been somewhat better. We will not make definite changes in his medications today. I will see him back in followup for her shortly.

## 2011-10-20 NOTE — Assessment & Plan Note (Signed)
The patient is stable from a cardiac standpoint. He is now more than 3 months from the percutaneous intervention, and given the fact that he got a second-generation stent his risk of stent thrombosis should be somewhat lower. He is still at increased risk because of his age, comorbidities, and underlying coronary artery disease. In addition, his PCI was not straightforward procedure. However, given the anatomic findings in his carotids, it would seem appropriate to move forward sooner rather than later, if the patient wants to proceed in this direction. Patient remains very undecided as he is doing well, and is 78 years old

## 2011-10-20 NOTE — Assessment & Plan Note (Signed)
Please see the carotid data. He has total occlusion of the right internal carotid, and high-grade disease on the left. He has a notable bruit, and this is high pitched. Surgery has been considered, and he is now up 4 months from his percutaneous intervention which was done in the setting of an acute coronary syndrome. He remains on dual antiplatelet therapy. When he had surgery, I might consider holding the Plavix for approximately 3 days, and operating under these circumstances. I would need to discuss this with Dr. Myra Gianotti.

## 2011-10-20 NOTE — Progress Notes (Signed)
HPI:  This delightful gentleman is in for followup. 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 He is returning to work. The patient has a total occlusion of the right carotid artery, and a high-grade stenosis on the left. He's been seen by the vascular surgeons. He underwent percutaneous intervention for a high-grade proximal circumflex stenosis that was somewhat problematic. He has remained stable from a cardiac standpoint. He is intermittent episodes of dizziness. We discussed the possibility of a carotid endarterectomy on the left because of high-grade disease, and the patient has been somewhat reluctant. He almost is of the mind to do very little about this at the present time. I presented the fax to him, and told him that if he wanted to proceed he should make an appointment to see Dr. Myra Gianotti. Otherwise, he is doing reasonably well from a cardiac standpoint  Current Outpatient Prescriptions  Medication Sig Dispense Refill  . acetaminophen (TYLENOL) 325 MG tablet Take 650 mg by mouth every 6 (six) hours as needed.      Marland Kitchen aspirin 81 MG tablet Take 1 tablet (81 mg total) by mouth daily.      . carvedilol (COREG) 6.25 MG tablet Take 1 tablet (6.25 mg total) by mouth 2 (two) times daily with a meal.  60 tablet  6  . clopidogrel (PLAVIX) 75 MG tablet Take 1 tablet (75 mg total) by mouth daily with breakfast.  30 tablet  6  . fish oil-omega-3 fatty acids 1000 MG capsule Take 1 g by mouth daily.      . insulin detemir (LEVEMIR) 100 UNIT/ML injection Inject 12-38 Units into the skin 2 (two) times daily. 38 units with breakfast and 12 units with dinner      . isosorbide mononitrate (IMDUR) 30 MG 24 hr tablet Take 30 mg by mouth daily after lunch.      . levothyroxine (SYNTHROID, LEVOTHROID) 25 MCG tablet Take 25 mcg by mouth daily.      Marland Kitchen lisinopril-hydrochlorothiazide (PRINZIDE,ZESTORETIC) 10-12.5 MG per tablet Take 1 tablet by mouth daily.      . nitroGLYCERIN (NITROSTAT) 0.4 MG SL tablet  Place 1 tablet (0.4 mg total) under the tongue every 5 (five) minutes x 3 doses as needed for chest pain.  25 tablet  4  . pantoprazole (PROTONIX) 40 MG tablet Take 40 mg by mouth daily after lunch.       . simvastatin (ZOCOR) 40 MG tablet Take 20 mg by mouth at bedtime.      Marland Kitchen ketoconazole (NIZORAL) 2 % cream Apply 1 application topically as needed. For rash        Allergies  Allergen Reactions  . Ativan (Lorazepam) Other (See Comments)    agitation    Past Medical History  Diagnosis Date  . Hypertension   . Diabetes mellitus   . CAD (coronary artery disease)     a. NSTEMI s/p complex PCI to prox Cx 07/03/11  . Encephalopathy     AMS during 06/2011 hospitalization - MRI of brain without infarct but did show R carotid dz, abnl EEG with nonspecific encephalopathy, felt secondary to drugs  . Carotid arterial disease     a. By MRI 05/2011;  b. 06/2011 Carotid U/S: LICA 80-99%, RICA 100%, patent vertebrals.  . Anemia     a. Felt to be iatrogenic from procedures/sticks 06/2011  . Transient atrial fibrillation or flutter     Brief WCT during hospitalization 06/2011 felt to be afib with abberation, not a  good coumadin candidate  . Myocardial infarction     Past Surgical History  Procedure Date  . Thumb surgery 1975    left  . Appendectomy 1948    Family History  Problem Relation Age of Onset  . Diabetes Mother   . Cancer Father   . Cancer Sister   . Cancer Brother   . Cancer Daughter     History   Social History  . Marital Status: Widowed    Spouse Name: N/A    Number of Children: N/A  . Years of Education: N/A   Occupational History  . Not on file.   Social History Main Topics  . Smoking status: Former Smoker    Types: Cigarettes    Quit date: 01/13/1986  . Smokeless tobacco: Not on file  . Alcohol Use: No  . Drug Use: No  . Sexually Active:    Other Topics Concern  . Not on file   Social History Narrative  . No narrative on file    ROS: Please see the  HPI.  All other systems reviewed and negative.  PHYSICAL EXAM:  BP 194/76  Pulse 63  Ht 5\' 5"  (1.651 m)  Wt 176 lb (79.833 kg)  BMI 29.29 kg/m2  SpO2 98%  General: Well developed, well nourished, in no acute distress. Head:  Normocephalic and atraumatic. Neck: no JVD.  Carotid bruit on the left not on the right.   Lungs: some moderate ronchii Heart: Normal S1 and S2.  Soft SEM.  No DM.   Abdomen:  Normal bowel sounds; soft; non tender; no organomegaly Pulses: Pulses normal in all 4 extremities. Extremities: No clubbing or cyanosis. No edema. Neurologic: Alert and oriented x 3.  EKG:  NSR.  RBBB.    ASSESSMENT AND PLAN:

## 2011-10-20 NOTE — Patient Instructions (Addendum)
Your physician recommends that you schedule a follow-up appointment in: 2 MONTHS  Please call Dr Estanislado Spire office at [phone] if you would like to schedule carotid surgery.  Your physician recommends that you continue on your current medications as directed. Please refer to the Current Medication list given to you today.  Your physician recommends that you have lab work today: BMP, LIPID, LIVER and CBC

## 2011-11-07 NOTE — Addendum Note (Signed)
Addended by: Iona Coach on: 11/07/2011 10:36 AM   Modules accepted: Orders

## 2011-12-22 ENCOUNTER — Ambulatory Visit (INDEPENDENT_AMBULATORY_CARE_PROVIDER_SITE_OTHER): Payer: Medicare Other | Admitting: Cardiology

## 2011-12-22 ENCOUNTER — Encounter: Payer: Self-pay | Admitting: Cardiology

## 2011-12-22 VITALS — BP 138/54 | HR 56 | Ht 65.0 in | Wt 180.0 lb

## 2011-12-22 DIAGNOSIS — I2510 Atherosclerotic heart disease of native coronary artery without angina pectoris: Secondary | ICD-10-CM

## 2011-12-22 DIAGNOSIS — I779 Disorder of arteries and arterioles, unspecified: Secondary | ICD-10-CM

## 2011-12-22 DIAGNOSIS — I10 Essential (primary) hypertension: Secondary | ICD-10-CM

## 2011-12-22 DIAGNOSIS — D649 Anemia, unspecified: Secondary | ICD-10-CM

## 2011-12-22 MED ORDER — LISINOPRIL-HYDROCHLOROTHIAZIDE 10-12.5 MG PO TABS: 1.0000 | ORAL_TABLET | Freq: Every day | ORAL | Status: DC

## 2011-12-22 NOTE — Patient Instructions (Addendum)
Your physician recommends that you schedule a follow-up appointment in: 2 MONTHS with Dr Stuckey  Your physician recommends that you continue on your current medications as directed. Please refer to the Current Medication list given to you today.  

## 2011-12-22 NOTE — Assessment & Plan Note (Signed)
No recurrent symptoms.  Continue medical therapy.

## 2011-12-22 NOTE — Progress Notes (Signed)
HPI:  The patient returns today in a follow up visit.  He and I had a delightful, thoughtful chat today.  He celebrated his 89th birthday yesterday.  He denies chest pain or dizziness.  We talked in depth about his carotid disease, and he has elected to defer.  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  I believe he understands the nature of his issue, and he has decided that an active surgical approach in his case might lead to functional disability and nursing home.  He therefore feels fine, despite the anatomy he knows he has, to be conservative about the approach.    Current Outpatient Prescriptions  Medication Sig Dispense Refill  . acetaminophen (TYLENOL) 325 MG tablet Take 650 mg by mouth every 6 (six) hours as needed.      Marland Kitchen aspirin 81 MG tablet Take 1 tablet (81 mg total) by mouth daily.      . carvedilol (COREG) 12.5 MG tablet Take 1/2 tablet by mouth twice a day      . clopidogrel (PLAVIX) 75 MG tablet Take 1 tablet (75 mg total) by mouth daily with breakfast.  30 tablet  6  . fish oil-omega-3 fatty acids 1000 MG capsule Take 1 g by mouth daily.      . insulin detemir (LEVEMIR) 100 UNIT/ML injection Inject 12-38 Units into the skin 2 (two) times daily. 38 units with breakfast and 12 units with dinner      . isosorbide mononitrate (IMDUR) 30 MG 24 hr tablet Take 30 mg by mouth daily after lunch.      . ketoconazole (NIZORAL) 2 % cream Apply 1 application topically as needed. For rash      . levothyroxine (SYNTHROID, LEVOTHROID) 50 MCG tablet Take 50 mcg by mouth daily.      Marland Kitchen lisinopril-hydrochlorothiazide (PRINZIDE,ZESTORETIC) 10-12.5 MG per tablet Take 1 tablet by mouth daily.      . nitroGLYCERIN (NITROSTAT) 0.4 MG SL tablet Place 1 tablet (0.4 mg total) under the tongue every 5 (five) minutes x 3 doses as needed for chest pain.  25 tablet  4  . pantoprazole (PROTONIX) 40 MG tablet Take 40 mg by mouth daily after lunch.       .  simvastatin (ZOCOR) 40 MG tablet Take 20 mg by mouth at bedtime.        Allergies  Allergen Reactions  . Ativan (Lorazepam) Other (See Comments)    agitation    Past Medical History  Diagnosis Date  . Hypertension   . Diabetes mellitus   . CAD (coronary artery disease)     a. NSTEMI s/p complex PCI to prox Cx 07/03/11  . Encephalopathy     AMS during 06/2011 hospitalization - MRI of brain without infarct but did show R carotid dz, abnl EEG with nonspecific encephalopathy, felt secondary to drugs  . Carotid arterial disease     a. By MRI 05/2011;  b. 06/2011 Carotid U/S: LICA 80-99%, RICA 100%, patent vertebrals.  . Anemia     a. Felt to be iatrogenic from procedures/sticks 06/2011  . Transient atrial fibrillation or flutter     Brief WCT during hospitalization 06/2011 felt to be afib with abberation, not a good coumadin candidate  . Myocardial infarction     Past Surgical History  Procedure Date  . Thumb surgery 1975    left  . Appendectomy 1948    Family History  Problem Relation  Age of Onset  . Diabetes Mother   . Cancer Father   . Cancer Sister   . Cancer Brother   . Cancer Daughter     History   Social History  . Marital Status: Widowed    Spouse Name: N/A    Number of Children: N/A  . Years of Education: N/A   Occupational History  . Not on file.   Social History Main Topics  . Smoking status: Former Smoker    Types: Cigarettes    Quit date: 01/13/1986  . Smokeless tobacco: Not on file  . Alcohol Use: No  . Drug Use: No  . Sexually Active:    Other Topics Concern  . Not on file   Social History Narrative  . No narrative on file    ROS: Please see the HPI.  All other systems reviewed and negative.  PHYSICAL EXAM:  BP 138/54  Pulse 56  Ht 5\' 5"  (1.651 m)  Wt 180 lb (81.647 kg)  BMI 29.95 kg/m2  SpO2 99%  General: Well developed, well nourished, in no acute distress. Head:  Normocephalic and atraumatic. Neck: no JVD.  Carotids known but  not examined.   Lungs: Clear to auscultation and percussion. Heart: Normal S1 and S2.  No murmur, rubs or gallops.  Abdomen:  Normal bowel sounds; soft; non tender; no organomegaly Pulses: Pulses normal in all 4 extremities. Extremities: No clubbing or cyanosis. No edema. Neurologic: Alert and oriented x 3.  EKG:   SB.  RBBB.  LAFB.  No acute changes.   ASSESSMENT AND PLAN:

## 2011-12-22 NOTE — Assessment & Plan Note (Signed)
Currently well controlled.  

## 2011-12-22 NOTE — Assessment & Plan Note (Signed)
He has elected against carotid endarterectomy.  He wants medical therapy.

## 2011-12-22 NOTE — Assessment & Plan Note (Signed)
Recheck and continue to monitor labs.  In light of his carotid disease, we might opt for long term continuation of DAPT.

## 2011-12-22 NOTE — Progress Notes (Signed)
HPI:  Current Outpatient Prescriptions  Medication Sig Dispense Refill  . acetaminophen (TYLENOL) 325 MG tablet Take 650 mg by mouth every 6 (six) hours as needed.      Marland Kitchen aspirin 81 MG tablet Take 1 tablet (81 mg total) by mouth daily.      . carvedilol (COREG) 12.5 MG tablet Take 1/2 tablet by mouth twice a day      . clopidogrel (PLAVIX) 75 MG tablet Take 1 tablet (75 mg total) by mouth daily with breakfast.  30 tablet  6  . fish oil-omega-3 fatty acids 1000 MG capsule Take 1 g by mouth daily.      . insulin detemir (LEVEMIR) 100 UNIT/ML injection Inject 12-38 Units into the skin 2 (two) times daily. 38 units with breakfast and 12 units with dinner      . isosorbide mononitrate (IMDUR) 30 MG 24 hr tablet Take 30 mg by mouth daily after lunch.      . ketoconazole (NIZORAL) 2 % cream Apply 1 application topically as needed. For rash      . levothyroxine (SYNTHROID, LEVOTHROID) 50 MCG tablet Take 50 mcg by mouth daily.      Marland Kitchen lisinopril-hydrochlorothiazide (PRINZIDE,ZESTORETIC) 10-12.5 MG per tablet Take 1 tablet by mouth daily.      . nitroGLYCERIN (NITROSTAT) 0.4 MG SL tablet Place 1 tablet (0.4 mg total) under the tongue every 5 (five) minutes x 3 doses as needed for chest pain.  25 tablet  4  . pantoprazole (PROTONIX) 40 MG tablet Take 40 mg by mouth daily after lunch.       . simvastatin (ZOCOR) 40 MG tablet Take 20 mg by mouth at bedtime.        Allergies  Allergen Reactions  . Ativan (Lorazepam) Other (See Comments)    agitation    Past Medical History  Diagnosis Date  . Hypertension   . Diabetes mellitus   . CAD (coronary artery disease)     a. NSTEMI s/p complex PCI to prox Cx 07/03/11  . Encephalopathy     AMS during 06/2011 hospitalization - MRI of brain without infarct but did show R carotid dz, abnl EEG with nonspecific encephalopathy, felt secondary to drugs  . Carotid arterial disease     a. By MRI 05/2011;  b. 06/2011 Carotid U/S: LICA 80-99%, RICA 100%, patent  vertebrals.  . Anemia     a. Felt to be iatrogenic from procedures/sticks 06/2011  . Transient atrial fibrillation or flutter     Brief WCT during hospitalization 06/2011 felt to be afib with abberation, not a good coumadin candidate  . Myocardial infarction     Past Surgical History  Procedure Date  . Thumb surgery 1975    left  . Appendectomy 1948    Family History  Problem Relation Age of Onset  . Diabetes Mother   . Cancer Father   . Cancer Sister   . Cancer Brother   . Cancer Daughter     History   Social History  . Marital Status: Widowed    Spouse Name: N/A    Number of Children: N/A  . Years of Education: N/A   Occupational History  . Not on file.   Social History Main Topics  . Smoking status: Former Smoker    Types: Cigarettes    Quit date: 01/13/1986  . Smokeless tobacco: Not on file  . Alcohol Use: No  . Drug Use: No  . Sexually Active:    Other Topics  Concern  . Not on file   Social History Narrative  . No narrative on file    ROS: Please see the HPI.  All other systems reviewed and negative.  PHYSICAL EXAM:  BP 138/54  Pulse 56  Ht 5\' 5"  (1.651 m)  Wt 180 lb (81.647 kg)  BMI 29.95 kg/m2  SpO2 99%  General: Well developed, well nourished, in no acute distress. Head:  Normocephalic and atraumatic. Neck: no JVD Lungs: Clear to auscultation and percussion. Heart: Normal S1 and S2.  No murmur, rubs or gallops.  Abdomen:  Normal bowel sounds; soft; non tender; no organomegaly Pulses: Pulses normal in all 4 extremities. Extremities: No clubbing or cyanosis. No edema. Neurologic: Alert and oriented x 3.  EKG:  ASSESSMENT AND PLAN:

## 2012-01-09 ENCOUNTER — Encounter (INDEPENDENT_AMBULATORY_CARE_PROVIDER_SITE_OTHER): Payer: Medicare Other

## 2012-01-09 DIAGNOSIS — I6529 Occlusion and stenosis of unspecified carotid artery: Secondary | ICD-10-CM

## 2012-02-24 ENCOUNTER — Encounter: Payer: Self-pay | Admitting: Cardiology

## 2012-02-24 ENCOUNTER — Ambulatory Visit (INDEPENDENT_AMBULATORY_CARE_PROVIDER_SITE_OTHER): Payer: Medicare Other | Admitting: Cardiology

## 2012-02-24 VITALS — BP 134/59 | HR 55 | Ht 66.0 in | Wt 181.0 lb

## 2012-02-24 DIAGNOSIS — D649 Anemia, unspecified: Secondary | ICD-10-CM

## 2012-02-24 DIAGNOSIS — I6529 Occlusion and stenosis of unspecified carotid artery: Secondary | ICD-10-CM

## 2012-02-24 DIAGNOSIS — I10 Essential (primary) hypertension: Secondary | ICD-10-CM

## 2012-02-24 DIAGNOSIS — I2510 Atherosclerotic heart disease of native coronary artery without angina pectoris: Secondary | ICD-10-CM

## 2012-02-24 LAB — CBC WITH DIFFERENTIAL/PLATELET
Basophils Absolute: 0 10*3/uL (ref 0.0–0.1)
Basophils Relative: 0.6 % (ref 0.0–3.0)
Hemoglobin: 11.9 g/dL — ABNORMAL LOW (ref 13.0–17.0)
Lymphocytes Relative: 28.5 % (ref 12.0–46.0)
Monocytes Relative: 8.3 % (ref 3.0–12.0)
Neutro Abs: 2.9 10*3/uL (ref 1.4–7.7)
RBC: 3.93 Mil/uL — ABNORMAL LOW (ref 4.22–5.81)
RDW: 15.2 % — ABNORMAL HIGH (ref 11.5–14.6)

## 2012-02-24 NOTE — Assessment & Plan Note (Signed)
The patient has severe disease, but is decided that he doesn't want to have anything done about it. He's 1 total occlusion in a fairly high-grade occlusion of the other vessel. He remains on  dual antiplatelet therapy, and has not had clinical bleeding.  We will recheck a CBC today to ensure that this is stable.

## 2012-02-24 NOTE — Assessment & Plan Note (Signed)
No current anginal symptoms at the present time.

## 2012-02-24 NOTE — Assessment & Plan Note (Signed)
Will recheck

## 2012-02-24 NOTE — Patient Instructions (Addendum)
Your physician recommends that you continue on your current medications as directed. Please refer to the Current Medication list given to you today.  Your physician recommends that you schedule a follow-up appointment in: 3 MONTHS with Dr Clifton James (previous pt of Dr Riley Kill)  Your physician recommends that you have lab work today: CBC

## 2012-02-24 NOTE — Assessment & Plan Note (Signed)
Currently controlled.

## 2012-02-24 NOTE — Progress Notes (Signed)
HPI:  Mr. continues to do well from a cardiac standpoint. He denies any chest pain. He is adamant about the fact that he does not want to have any treatment of his carotid stenosis. He wants remain on medical therapy at the present time. He's not had any significant bleeding. He feels really quite good.    Current Outpatient Prescriptions  Medication Sig Dispense Refill  . acetaminophen (TYLENOL) 325 MG tablet Take 650 mg by mouth every 6 (six) hours as needed.      Marland Kitchen aspirin 81 MG tablet Take 1 tablet (81 mg total) by mouth daily.      . carvedilol (COREG) 12.5 MG tablet Take 1/2 tablet by mouth twice a day      . clopidogrel (PLAVIX) 75 MG tablet Take 1 tablet (75 mg total) by mouth daily with breakfast.  30 tablet  6  . fish oil-omega-3 fatty acids 1000 MG capsule Take 1 g by mouth 3 (three) times daily.       . insulin detemir (LEVEMIR) 100 UNIT/ML injection Inject 12-38 Units into the skin 2 (two) times daily. 38 units with breakfast and 12 units with dinner      . isosorbide mononitrate (IMDUR) 30 MG 24 hr tablet Take 30 mg by mouth daily after lunch.      . ketoconazole (NIZORAL) 2 % cream Apply 1 application topically as needed. For rash      . levothyroxine (SYNTHROID, LEVOTHROID) 50 MCG tablet Take 50 mcg by mouth daily.      Marland Kitchen lisinopril-hydrochlorothiazide (PRINZIDE,ZESTORETIC) 10-12.5 MG per tablet Take 1 tablet by mouth daily.  30 tablet  11  . nitroGLYCERIN (NITROSTAT) 0.4 MG SL tablet Place 1 tablet (0.4 mg total) under the tongue every 5 (five) minutes x 3 doses as needed for chest pain.  25 tablet  4  . pantoprazole (PROTONIX) 40 MG tablet Take 40 mg by mouth daily after lunch.       . simvastatin (ZOCOR) 40 MG tablet Take 20 mg by mouth at bedtime.       No current facility-administered medications for this visit.    Allergies  Allergen Reactions  . Ativan (Lorazepam) Other (See Comments)    agitation    Past Medical History  Diagnosis Date  . Hypertension   .  Diabetes mellitus   . CAD (coronary artery disease)     a. NSTEMI s/p complex PCI to prox Cx 07/03/11  . Encephalopathy     AMS during 06/2011 hospitalization - MRI of brain without infarct but did show R carotid dz, abnl EEG with nonspecific encephalopathy, felt secondary to drugs  . Carotid arterial disease     a. By MRI 05/2011;  b. 06/2011 Carotid U/S: LICA 80-99%, RICA 100%, patent vertebrals.  . Anemia     a. Felt to be iatrogenic from procedures/sticks 06/2011  . Transient atrial fibrillation or flutter     Brief WCT during hospitalization 06/2011 felt to be afib with abberation, not a good coumadin candidate  . Myocardial infarction     Past Surgical History  Procedure Laterality Date  . Thumb surgery  1975    left  . Appendectomy  1948    Family History  Problem Relation Age of Onset  . Diabetes Mother   . Cancer Father   . Cancer Sister   . Cancer Brother   . Cancer Daughter     History   Social History  . Marital Status: Widowed  Spouse Name: N/A    Number of Children: N/A  . Years of Education: N/A   Occupational History  . Not on file.   Social History Main Topics  . Smoking status: Former Smoker    Types: Cigarettes    Quit date: 01/13/1986  . Smokeless tobacco: Not on file  . Alcohol Use: No  . Drug Use: No  . Sexually Active:    Other Topics Concern  . Not on file   Social History Narrative  . No narrative on file    ROS: Please see the HPI.  All other systems reviewed and negative.  PHYSICAL EXAM:  BP 134/59  Pulse 55  Ht 5\' 6"  (1.676 m)  Wt 181 lb (82.101 kg)  BMI 29.23 kg/m2  SpO2 97%  General: Well developed, well nourished, in no acute distress. Head:  Normocephalic and atraumatic. Neck: no JVD.  Bilateral Carotid bruits  L>R.  .   Lungs: Clear to auscultation and percussion. Heart: Normal S1 and S2.  Early diastolic blow 1/6, with 2/6 SEM.  Abdomen:  Normal bowel sounds; soft; non tender; no organomegaly Pulses: Pulses  normal in all 4 extremities. Extremities: No clubbing or cyanosis. LLE edema 1-2plus Neurologic: Alert and oriented x 3.  EKG:   ASSESSMENT AND PLAN:

## 2012-04-19 ENCOUNTER — Encounter: Payer: Self-pay | Admitting: Cardiovascular Disease

## 2012-04-19 ENCOUNTER — Telehealth: Payer: Self-pay | Admitting: Cardiovascular Disease

## 2012-05-24 ENCOUNTER — Ambulatory Visit: Payer: Medicare Other | Admitting: Cardiovascular Disease

## 2012-06-10 ENCOUNTER — Ambulatory Visit (INDEPENDENT_AMBULATORY_CARE_PROVIDER_SITE_OTHER): Payer: Medicare Other | Admitting: Cardiovascular Disease

## 2012-06-10 ENCOUNTER — Encounter: Payer: Self-pay | Admitting: Cardiovascular Disease

## 2012-06-10 VITALS — BP 140/60 | HR 71 | Ht 65.0 in | Wt 179.0 lb

## 2012-06-10 DIAGNOSIS — I2510 Atherosclerotic heart disease of native coronary artery without angina pectoris: Secondary | ICD-10-CM

## 2012-06-10 DIAGNOSIS — I779 Disorder of arteries and arterioles, unspecified: Secondary | ICD-10-CM

## 2012-06-10 NOTE — Progress Notes (Signed)
History of Present Illness: 79 yo male with history of DM, HTN, CAD, carotid artery stenosis here today for cardiac follow up. He has been followed by Dr. Riley Kill. He has CAD with NSTEMI June 2013 with complex PCI of the Circumflex in June 2013 with 2.5 mm Resolute DES in the proximal Circumflex. Cath showd He also has severe carotid artery disease with 100% occlusion of the RICA and high grade disease in the LICA. He has refused to have treatment of this disease. He was seen by Dr. Myra Gianotti in [facility] in July 2013 and discussed treatment options but refused any treatment.   He is feeling well. No chest pain. No SOB. 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 Tolerating all meds.   Primary Care Physician: Nicholos Johns [facility])  Last Lipid Profile:Lipid Panel     Component Value Date/Time   CHOL 123 10/20/2011 1112   TRIG 62.0 10/20/2011 1112   HDL 50.70 10/20/2011 1112   CHOLHDL 2 10/20/2011 1112   VLDL 12.4 10/20/2011 1112   LDLCALC 60 10/20/2011 1112     Past Medical History  Diagnosis Date  . Hypertension   . Diabetes mellitus   . CAD (coronary artery disease)     a. NSTEMI s/p complex PCI to prox Cx 07/03/11  . Encephalopathy     AMS during 06/2011 hospitalization - MRI of brain without infarct but did show R carotid dz, abnl EEG with nonspecific encephalopathy, felt secondary to drugs  . Carotid arterial disease     a. By MRI 05/2011;  b. 06/2011 Carotid U/S: LICA 80-99%, RICA 100%, patent vertebrals.  . Anemia     a. Felt to be iatrogenic from procedures/sticks 06/2011  . Transient atrial fibrillation or flutter     Brief WCT during hospitalization 06/2011 felt to be afib with abberation, not a good coumadin candidate  . Myocardial infarction     Past Surgical History  Procedure Laterality Date  . Thumb surgery  1975    left  . Appendectomy  1948    Current Outpatient Prescriptions  Medication Sig Dispense Refill  . acetaminophen (TYLENOL) 325 MG tablet  Take 650 mg by mouth every 6 (six) hours as needed.      Marland Kitchen aspirin 81 MG tablet Take 1 tablet (81 mg total) by mouth daily.      . carvedilol (COREG) 12.5 MG tablet Take 1/2 tablet by mouth twice a day      . clopidogrel (PLAVIX) 75 MG tablet Take 1 tablet (75 mg total) by mouth daily with breakfast.  30 tablet  6  . fish oil-omega-3 fatty acids 1000 MG capsule Take 1 g by mouth 3 (three) times daily.       . insulin detemir (LEVEMIR) 100 UNIT/ML injection Inject 12-38 Units into the skin 2 (two) times daily. 38 units with breakfast and 12 units with dinner      . isosorbide mononitrate (IMDUR) 30 MG 24 hr tablet Take 30 mg by mouth daily after lunch.      . ketoconazole (NIZORAL) 2 % cream Apply 1 application topically as needed. For rash      . levothyroxine (SYNTHROID, LEVOTHROID) 50 MCG tablet Take 50 mcg by mouth daily.      Marland Kitchen lisinopril-hydrochlorothiazide (PRINZIDE,ZESTORETIC) 10-12.5 MG per tablet Take 1 tablet by mouth daily.  30 tablet  11  . nitroGLYCERIN (NITROSTAT) 0.4 MG SL tablet Place 1 tablet (0.4 mg total) under the tongue every 5 (five) minutes x 3  doses as needed for chest pain.  25 tablet  4  . pantoprazole (PROTONIX) 40 MG tablet Take 40 mg by mouth daily after lunch.       . simvastatin (ZOCOR) 40 MG tablet Take 20 mg by mouth at bedtime.       No current facility-administered medications for this visit.    Allergies  Allergen Reactions  . Ativan (Lorazepam) Other (See Comments)    agitation    History   Social History  . Marital Status: Widowed    Spouse Name: N/A    Number of Children: N/A  . Years of Education: N/A   Occupational History  . Not on file.   Social History Main Topics  . Smoking status: Former Smoker    Types: Cigarettes    Quit date: 01/13/1986  . Smokeless tobacco: Not on file  . Alcohol Use: No  . Drug Use: No  . Sexually Active:    Other Topics Concern  . Not on file   Social History Narrative  . No narrative on file     Family History  Problem Relation Age of Onset  . Diabetes Mother   . Cancer Father   . Cancer Sister   . Cancer Brother   . Cancer Daughter     Review of Systems:  As stated in the HPI and otherwise negative.   BP 140/60  Pulse 71  Ht 5\' 5"  (1.651 m)  Wt 179 lb (81.194 kg)  BMI 29.79 kg/m2  Physical Examination: General: Well developed, well nourished, NAD HEENT: OP clear, mucus membranes moist SKIN: warm, dry. No rashes. Neuro: No focal deficits Musculoskeletal: Muscle strength 5/5 all ext Psychiatric: Mood and affect normal Neck: No JVD, no carotid bruits, no thyromegaly, no lymphadenopathy. Lungs:Clear bilaterally, no wheezes, rhonci, crackles Cardiovascular: Regular rate and rhythm. No murmurs, gallops or rubs. Abdomen:Soft. Bowel sounds present. Non-tender.  Extremities: 1+ bilateral  lower extremity edema. Pulses are 2 + in the bilateral DP/PT.  EKG: NSR, rate 71 bpm. RBBB. LAFB. LVH. Poor R wave progression.   Cardiac cath 07/01/11: Left mainstem: The vessel is heavily calcified, but without obstruction.  Left anterior descending (LAD): The vessel is also heavily calcified. There is moderate bifurcation plaque at the takeoff of the second diagonal. More distally, there is a 60% eccentric area of narrowing that does not appear flow limiting. The large D2 also has some ostial narrowing that does not appear critical.  Left circumflex (LCx): The CFX has a short common trunk then divides to a large OM1 and AV circumflex. Just after the OM there is a calcified high grade stenosis leading to a large OM vessel. There is some plaquing of 50% in the mid OM1 and the 95 % lesion in the AV circ is calcified, and abuts the OM1. The distal AV circ is quite large.  Right coronary artery (RCA): The vessel is calcified and supplies a large PD and PL branch. There is calcification throughout without critical narrowing. The PDA has some mild ostial narrowing.  Left ventriculography: Left  ventricular systolic function is normal, LVEF is estimated at 55-65%, there is no significant mitral regurgitation    Assessment and Plan:   1. CAD:  Stable. No changes today. Continue ASA/Plavix.   2. Carotid artery disease: Severe disease in LICA with occluded RICA. Has been seen in [facility] but refuses treatment. He is on ASA and Plavix.   3. HTN: BP controlled. No changes.

## 2012-06-10 NOTE — Patient Instructions (Addendum)
Your physician wants you to follow-up in:  6 months. You will receive a reminder letter in the mail two months in advance. If you don't receive a letter, please call our office to schedule the follow-up appointment.   

## 2012-08-29 IMAGING — CR DG CHEST 2V
2 series · 2 of 2 positions shown · non-contrast
Comparison: Chest 06/28/2011.

CLINICAL DATA: Dizziness.

CHEST - 2 VIEW

[w chest pa]
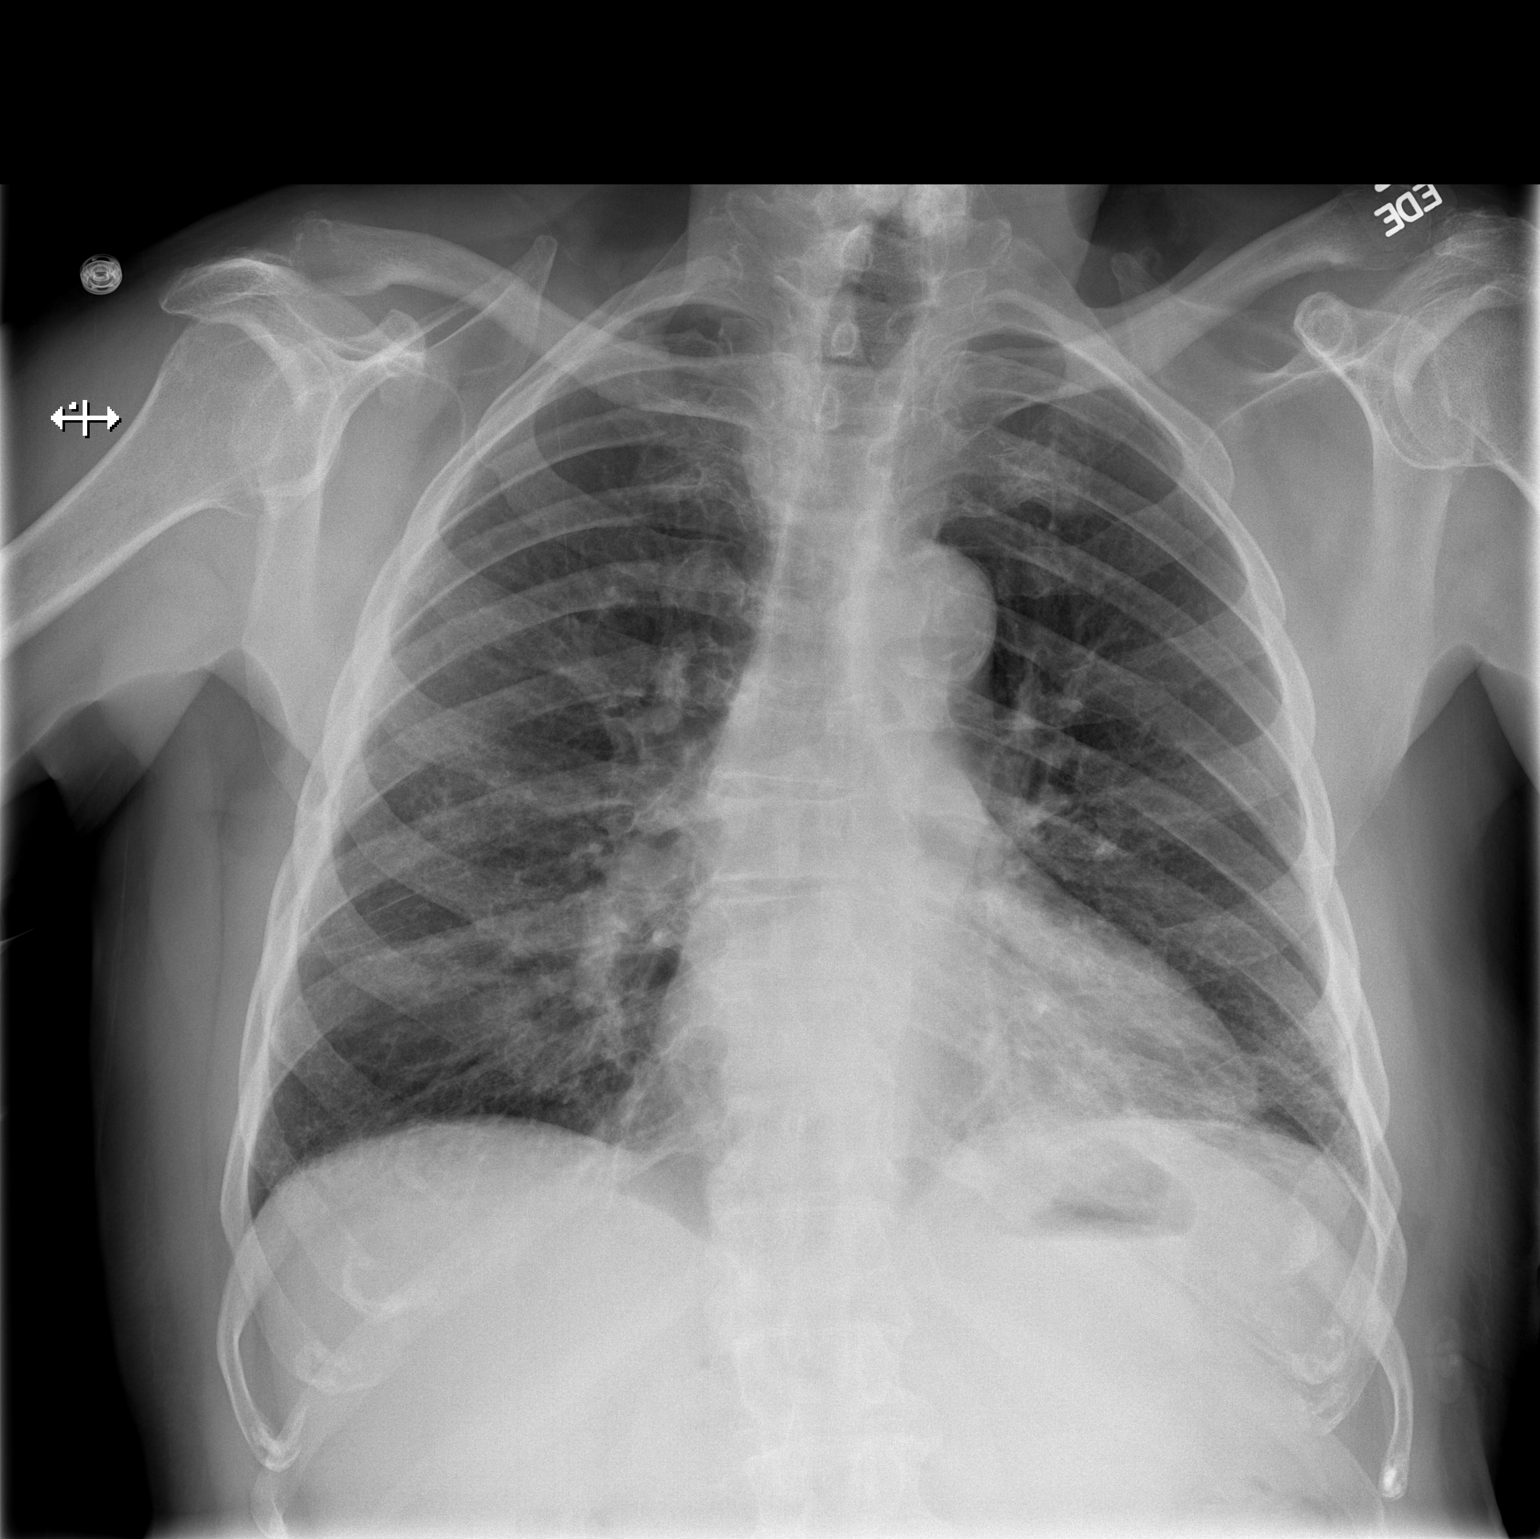

[w chest lat]
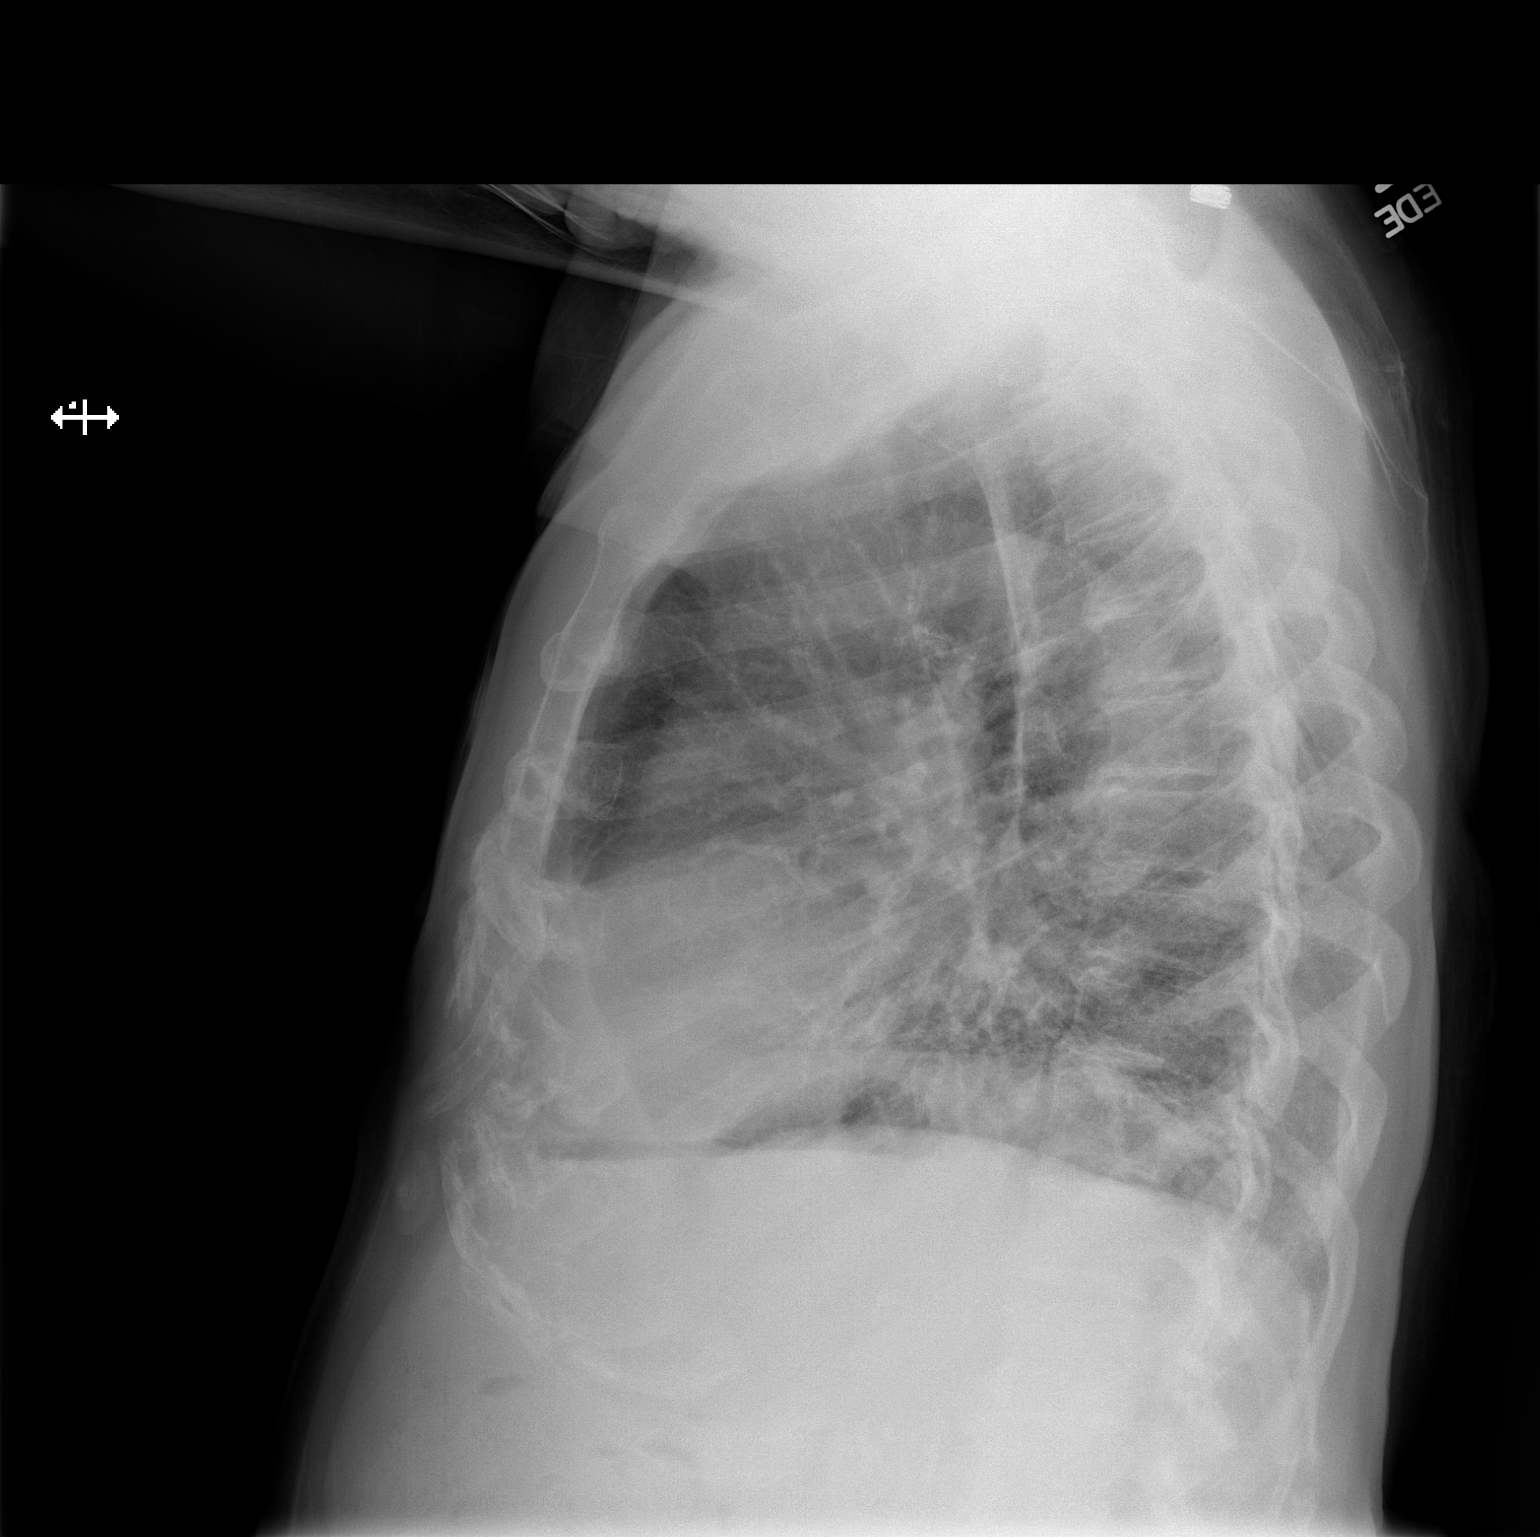

[2 of 2 positions shown; findings below may reference images not displayed]

FINDINGS: Streaky left basilar airspace opacity is identified.  The
degree of opacity in the left base is improved.  Right lung is
clear.  Heart size is normal.
IMPRESSION: Streaky left basilar airspace opacity could be due to atelectasis
or pneumonia.  Aeration in the left base appears improved since the
prior study.

## 2012-11-23 ENCOUNTER — Ambulatory Visit (INDEPENDENT_AMBULATORY_CARE_PROVIDER_SITE_OTHER): Payer: Medicare Other | Admitting: Cardiovascular Disease

## 2012-11-23 ENCOUNTER — Encounter: Payer: Self-pay | Admitting: Cardiovascular Disease

## 2012-11-23 VITALS — BP 158/78 | HR 55 | Ht 65.0 in | Wt 177.0 lb

## 2012-11-23 DIAGNOSIS — I2510 Atherosclerotic heart disease of native coronary artery without angina pectoris: Secondary | ICD-10-CM

## 2012-11-23 DIAGNOSIS — I10 Essential (primary) hypertension: Secondary | ICD-10-CM

## 2012-11-23 DIAGNOSIS — I779 Disorder of arteries and arterioles, unspecified: Secondary | ICD-10-CM

## 2012-11-23 DIAGNOSIS — I5032 Chronic diastolic (congestive) heart failure: Secondary | ICD-10-CM

## 2012-11-23 NOTE — Progress Notes (Signed)
History of Present Illness: 79 yo male with history of DM, HTN, CAD, carotid artery stenosis here today for cardiac follow up. He has been followed by Dr. Riley Kill. He has CAD with NSTEMI June 2013 with complex PCI of the Circumflex in June 2013 with 2.5 mm Resolute DES in the proximal Circumflex. His cath showed heavily calcified RCA and LAD with moderate LAD stenosis.  He also has severe carotid artery disease with 100% occlusion of the RICA and high grade disease in the LICA. He has refused to have treatment of this disease. He was seen by Dr. Myra Gianotti in [facility] in July 2013 and discussed treatment options but refused any treatment.   He is feeling well. No chest pain. No SOB. Tolerating all meds. Some swelling in legs during the day.   Primary Care Physician: Nicholos Johns [facility])  Last Lipid Profile:Lipid Panel     Component Value Date/Time   CHOL 123 10/20/2011 1112   TRIG 62.0 10/20/2011 1112   HDL 50.70 10/20/2011 1112   CHOLHDL 2 10/20/2011 1112   VLDL 12.4 10/20/2011 1112   LDLCALC 60 10/20/2011 1112     Past Medical History  Diagnosis Date  . Hypertension   . Diabetes mellitus   . CAD (coronary artery disease)     a. NSTEMI s/p complex PCI to prox Cx 07/03/11  . Encephalopathy     AMS during 06/2011 hospitalization - MRI of brain without infarct but did show R carotid dz, abnl EEG with nonspecific encephalopathy, felt secondary to drugs  . Carotid arterial disease     a. By MRI 05/2011;  b. 06/2011 Carotid U/S: LICA 80-99%, RICA 100%, patent vertebrals.  . Anemia     a. Felt to be iatrogenic from procedures/sticks 06/2011  . Transient atrial fibrillation or flutter     Brief WCT during hospitalization 06/2011 felt to be afib with abberation, not a good coumadin candidate  . Myocardial infarction     Past Surgical History  Procedure Laterality Date  . Thumb surgery  1975    left  . Appendectomy  1948    Current Outpatient Prescriptions  Medication Sig  Dispense Refill  . acetaminophen (TYLENOL) 325 MG tablet Take 650 mg by mouth every 6 (six) hours as needed.      Marland Kitchen aspirin 81 MG tablet Take 1 tablet (81 mg total) by mouth daily.      . carvedilol (COREG) 12.5 MG tablet Take 1/2 tablet by mouth twice a day      . fish oil-omega-3 fatty acids 1000 MG capsule Take 1 g by mouth 3 (three) times daily.       Marland Kitchen FLUZONE HIGH-DOSE injection       . insulin detemir (LEVEMIR) 100 UNIT/ML injection Inject 12-38 Units into the skin 2 (two) times daily. 38 units with breakfast and 12 units with dinner      . levothyroxine (SYNTHROID, LEVOTHROID) 50 MCG tablet Take 50 mcg by mouth daily.      Marland Kitchen lisinopril-hydrochlorothiazide (PRINZIDE,ZESTORETIC) 10-12.5 MG per tablet Take 1 tablet by mouth daily.  30 tablet  11  . pantoprazole (PROTONIX) 40 MG tablet Take 40 mg by mouth daily after lunch.       . simvastatin (ZOCOR) 40 MG tablet Take 20 mg by mouth at bedtime.      . CLOPIDOGREL BISULFATE PO Take 75 mg by mouth daily.      . isosorbide mononitrate (IMDUR) 30 MG 24 hr tablet Take 30 mg  by mouth daily after lunch.      . ketoconazole (NIZORAL) 2 % cream Apply 1 application topically as needed. For rash      . nitroGLYCERIN (NITROSTAT) 0.4 MG SL tablet Place 1 tablet (0.4 mg total) under the tongue every 5 (five) minutes x 3 doses as needed for chest pain.  25 tablet  4   No current facility-administered medications for this visit.    Allergies  Allergen Reactions  . Ativan [Lorazepam] Other (See Comments)    agitation    History   Social History  . Marital Status: Widowed    Spouse Name: N/A    Number of Children: N/A  . Years of Education: N/A   Occupational History  . Not on file.   Social History Main Topics  . Smoking status: Former Smoker    Types: Cigarettes    Quit date: 01/13/1986  . Smokeless tobacco: Not on file  . Alcohol Use: No  . Drug Use: No  . Sexual Activity:    Other Topics Concern  . Not on file   Social History  Narrative  . No narrative on file    Family History  Problem Relation Age of Onset  . Diabetes Mother   . Cancer Father   . Cancer Sister   . Cancer Brother   . Cancer Daughter     Review of Systems:  As stated in the HPI and otherwise negative.   BP 158/78  Pulse 55  Ht 5\' 5"  (1.651 m)  Wt 177 lb (80.287 kg)  BMI 29.45 kg/m2  Physical Examination: General: Well developed, well nourished, NAD HEENT: OP clear, mucus membranes moist SKIN: warm, dry. No rashes. Neuro: No focal deficits Musculoskeletal: Muscle strength 5/5 all ext Psychiatric: Mood and affect normal Neck: No JVD, no carotid bruits, no thyromegaly, no lymphadenopathy. Lungs:Clear bilaterally, no wheezes, rhonci, crackles Cardiovascular: Regular rate and rhythm. No murmurs, gallops or rubs. Abdomen:Soft. Bowel sounds present. Non-tender.  Extremities: 1+ bilateral  lower extremity edema. Pulses are 2 + in the bilateral DP/PT.  EKG: NSR, rate 71 bpm. RBBB. LAFB. LVH. Poor R wave progression.   Cardiac cath 07/01/11: Left mainstem: The vessel is heavily calcified, but without obstruction.  Left anterior descending (LAD): The vessel is also heavily calcified. There is moderate bifurcation plaque at the takeoff of the second diagonal. More distally, there is a 60% eccentric area of narrowing that does not appear flow limiting. The large D2 also has some ostial narrowing that does not appear critical.  Left circumflex (LCx): The CFX has a short common trunk then divides to a large OM1 and AV circumflex. Just after the OM there is a calcified high grade stenosis leading to a large OM vessel. There is some plaquing of 50% in the mid OM1 and the 95 % lesion in the AV circ is calcified, and abuts the OM1. The distal AV circ is quite large.  Right coronary artery (RCA): The vessel is calcified and supplies a large PD and PL branch. There is calcification throughout without critical narrowing. The PDA has some mild ostial  narrowing.  Left ventriculography: Left ventricular systolic function is normal, LVEF is estimated at 55-65%, there is no significant mitral regurgitation    Echo June 2013: Left ventricle: The cavity size was normal. Wall thickness was increased in a pattern of moderate to severe LVH. Systolic function was vigorous. The estimated ejection fraction was in the range of 65% to 70%. Wall motion was normal; there  were no regional wall motion abnormalities. Doppler parameters are consistent with abnormal left ventricular relaxation (grade 1 diastolic dysfunction). - Aortic valve: Cusp separation was mildly reduced. Trivial regurgitation. - Mitral valve: Mild regurgitation. - Pulmonary arteries: Systolic pressure was mildly increased. PA peak pressure: 32mm Hg (S).    Assessment and Plan:   1. CAD:  Stable. No chest pain. No changes today. Continue ASA/Plavix.   2. Carotid artery disease: Severe disease in LICA with occluded RICA. Has been seen in [facility] but refuses treatment. He is on ASA and Plavix.   3. HTN: BP slightly elevated today. May need to have meds adjusted if still elevated at primary care f/u in January 2015. No changes today.    4. Chronic diastolic CHF: He has some LE edema during the day, resolves at night. I have offered to start Lasix but he does not wish to do so at this time. Will call if his LE swelling worsens. He has no dyspnea with exertion. Weight is stable over last year.

## 2012-11-23 NOTE — Patient Instructions (Signed)
Your physician wants you to follow-up in:  6 months. You will receive a reminder letter in the mail two months in advance. If you don't receive a letter, please call our office to schedule the follow-up appointment.   

## 2012-12-07 ENCOUNTER — Emergency Department (HOSPITAL_BASED_OUTPATIENT_CLINIC_OR_DEPARTMENT_OTHER)
Admission: EM | Admit: 2012-12-07 | Discharge: 2012-12-07 | Disposition: A | Discharge status: Home / Self Care | Payer: No Typology Code available for payment source | Attending: Emergency Medicine | Admitting: Emergency Medicine

## 2012-12-07 ENCOUNTER — Encounter (HOSPITAL_BASED_OUTPATIENT_CLINIC_OR_DEPARTMENT_OTHER): Payer: Self-pay | Admitting: Emergency Medicine

## 2012-12-07 DIAGNOSIS — Z043 Encounter for examination and observation following other accident: Secondary | ICD-10-CM | POA: Insufficient documentation

## 2012-12-07 DIAGNOSIS — Y9389 Activity, other specified: Secondary | ICD-10-CM | POA: Diagnosis not present

## 2012-12-07 DIAGNOSIS — Z87891 Personal history of nicotine dependence: Secondary | ICD-10-CM | POA: Insufficient documentation

## 2012-12-07 DIAGNOSIS — Y92410 Unspecified street and highway as the place of occurrence of the external cause: Secondary | ICD-10-CM | POA: Insufficient documentation

## 2012-12-07 DIAGNOSIS — Z79899 Other long term (current) drug therapy: Secondary | ICD-10-CM | POA: Diagnosis not present

## 2012-12-07 DIAGNOSIS — I10 Essential (primary) hypertension: Secondary | ICD-10-CM | POA: Insufficient documentation

## 2012-12-07 DIAGNOSIS — Z8669 Personal history of other diseases of the nervous system and sense organs: Secondary | ICD-10-CM | POA: Insufficient documentation

## 2012-12-07 DIAGNOSIS — Z7902 Long term (current) use of antithrombotics/antiplatelets: Secondary | ICD-10-CM | POA: Diagnosis not present

## 2012-12-07 DIAGNOSIS — I2510 Atherosclerotic heart disease of native coronary artery without angina pectoris: Secondary | ICD-10-CM | POA: Insufficient documentation

## 2012-12-07 DIAGNOSIS — E119 Type 2 diabetes mellitus without complications: Secondary | ICD-10-CM | POA: Diagnosis not present

## 2012-12-07 DIAGNOSIS — Z794 Long term (current) use of insulin: Secondary | ICD-10-CM | POA: Diagnosis not present

## 2012-12-07 DIAGNOSIS — I252 Old myocardial infarction: Secondary | ICD-10-CM | POA: Diagnosis not present

## 2012-12-07 DIAGNOSIS — M129 Arthropathy, unspecified: Secondary | ICD-10-CM | POA: Diagnosis not present

## 2012-12-07 DIAGNOSIS — Z7982 Long term (current) use of aspirin: Secondary | ICD-10-CM | POA: Insufficient documentation

## 2012-12-07 DIAGNOSIS — I4891 Unspecified atrial fibrillation: Secondary | ICD-10-CM | POA: Diagnosis not present

## 2012-12-07 HISTORY — DX: Unspecified osteoarthritis, unspecified site: M19.90
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
## 2013-05-24 ENCOUNTER — Encounter: Payer: Self-pay | Admitting: Cardiovascular Disease

## 2013-05-24 ENCOUNTER — Ambulatory Visit (INDEPENDENT_AMBULATORY_CARE_PROVIDER_SITE_OTHER): Payer: Medicare Other | Admitting: Cardiovascular Disease

## 2013-05-24 VITALS — BP 108/50 | HR 58 | Ht 65.0 in | Wt 181.0 lb

## 2013-05-24 DIAGNOSIS — I5032 Chronic diastolic (congestive) heart failure: Secondary | ICD-10-CM

## 2013-05-24 DIAGNOSIS — I779 Disorder of arteries and arterioles, unspecified: Secondary | ICD-10-CM

## 2013-05-24 DIAGNOSIS — I739 Peripheral vascular disease, unspecified: Secondary | ICD-10-CM

## 2013-05-24 DIAGNOSIS — I2510 Atherosclerotic heart disease of native coronary artery without angina pectoris: Secondary | ICD-10-CM

## 2013-05-24 DIAGNOSIS — I10 Essential (primary) hypertension: Secondary | ICD-10-CM

## 2013-05-24 MED ORDER — CLOPIDOGREL BISULFATE 75 MG PO TABS: 75.0000 mg | ORAL_TABLET | Freq: Every day | ORAL | Status: DC

## 2013-05-24 NOTE — Progress Notes (Signed)
History of Present Illness: 80 yo male with history of DM, HTN, CAD, carotid artery stenosis here today for cardiac follow up. He has been followed by Dr. Riley KillStuckey. He has CAD with NSTEMI June 2013 with complex PCI of the Circumflex in June 2013 with 2.5 mm Resolute DES in the proximal Circumflex. His cath showed heavily calcified RCA and LAD with moderate LAD stenosis.  He also has severe carotid artery disease with 100% occlusion of the RICA and high grade disease in the LICA. He has refused to have treatment of this disease. He was seen by Dr. Myra GianottiBrabham in [facility] in July 2013 and discussed treatment options but refused any treatment.   He is feeling well. No chest pain. No SOB. Tolerating all meds. He has been out of Plavix since November 2014. Dry cough for months. He has been on Lisinopril. Occasional LE edema.   Primary Care Physician: Nicholos JohnsRamachandran [facility])  Last Lipid Profile:Lipid Panel     Component Value Date/Time   CHOL 123 10/20/2011 1112   TRIG 62.0 10/20/2011 1112   HDL 50.70 10/20/2011 1112   CHOLHDL 2 10/20/2011 1112   VLDL 12.4 10/20/2011 1112   LDLCALC 60 10/20/2011 1112     Past Medical History  Diagnosis Date  . Hypertension   . Diabetes mellitus   . CAD (coronary artery disease)     a. NSTEMI s/p complex PCI to prox Cx 07/03/11  . Encephalopathy     AMS during 06/2011 hospitalization - MRI of brain without infarct but did show R carotid dz, abnl EEG with nonspecific encephalopathy, felt secondary to drugs  . Carotid arterial disease     a. By MRI 05/2011;  b. 06/2011 Carotid U/S: LICA 80-99%, RICA 100%, patent vertebrals.  . Anemia     a. Felt to be iatrogenic from procedures/sticks 06/2011  . Transient atrial fibrillation or flutter     Brief WCT during hospitalization 06/2011 felt to be afib with abberation, not a good coumadin candidate  . Myocardial infarction   . Arthritis     Past Surgical History  Procedure Laterality Date  . Thumb surgery   1975    left  . Appendectomy  1948    Current Outpatient Prescriptions  Medication Sig Dispense Refill  . acetaminophen (TYLENOL) 325 MG tablet Take 650 mg by mouth every 6 (six) hours as needed.      Marland Kitchen. aspirin 81 MG tablet Take 1 tablet (81 mg total) by mouth daily.      . carvedilol (COREG) 12.5 MG tablet Take 1/2 tablet by mouth twice a day      . fish oil-omega-3 fatty acids 1000 MG capsule Take 1 g by mouth 3 (three) times daily.       Marland Kitchen. FLUZONE HIGH-DOSE injection       . insulin detemir (LEVEMIR) 100 UNIT/ML injection Inject 12-38 Units into the skin 2 (two) times daily. 38 units with breakfast and 12 units with dinner      . ketoconazole (NIZORAL) 2 % cream Apply 1 application topically as needed. For rash      . levothyroxine (SYNTHROID, LEVOTHROID) 50 MCG tablet Take 50 mcg by mouth daily.      Marland Kitchen. lisinopril-hydrochlorothiazide (PRINZIDE,ZESTORETIC) 10-12.5 MG per tablet Take 1 tablet by mouth daily.  30 tablet  11  . pantoprazole (PROTONIX) 40 MG tablet Take 40 mg by mouth daily after lunch.       Marland Kitchen. Specialty Vitamins Products (PROSTATE PO) Take by  mouth. Super beta prostate, 600 mg      . CLOPIDOGREL BISULFATE PO Take 75 mg by mouth daily.      . isosorbide mononitrate (IMDUR) 30 MG 24 hr tablet Take 30 mg by mouth daily after lunch.      . nitroGLYCERIN (NITROSTAT) 0.4 MG SL tablet Place 1 tablet (0.4 mg total) under the tongue every 5 (five) minutes x 3 doses as needed for chest pain.  25 tablet  4   No current facility-administered medications for this visit.    Allergies  Allergen Reactions  . Ativan [Lorazepam] Other (See Comments)    agitation    History   Social History  . Marital Status: Widowed    Spouse Name: N/A    Number of Children: N/A  . Years of Education: N/A   Occupational History  . Not on file.   Social History Main Topics  . Smoking status: Former Smoker    Types: Cigarettes    Quit date: 01/13/1986  . Smokeless tobacco: Not on file  .  Alcohol Use: No  . Drug Use: No  . Sexual Activity: Not on file   Other Topics Concern  . Not on file   Social History Narrative  . No narrative on file    Family History  Problem Relation Age of Onset  . Diabetes Mother   . Cancer Father   . Cancer Sister   . Cancer Brother   . Cancer Daughter     Review of Systems:  As stated in the HPI and otherwise negative.   BP 108/50  Pulse 58  Ht 5\' 5"  (1.651 m)  Wt 181 lb (82.101 kg)  BMI 30.12 kg/m2  Physical Examination: General: Well developed, well nourished, NAD HEENT: OP clear, mucus membranes moist SKIN: warm, dry. No rashes. Neuro: No focal deficits Musculoskeletal: Muscle strength 5/5 all ext Psychiatric: Mood and affect normal Neck: No JVD, no carotid bruits, no thyromegaly, no lymphadenopathy. Lungs:Clear bilaterally, no wheezes, rhonci, crackles Cardiovascular: Regular rate and rhythm. No murmurs, gallops or rubs. Abdomen:Soft. Bowel sounds present. Non-tender.  Extremities: 1+ bilateral  lower extremity edema. Pulses are 2 + in the bilateral DP/PT.  EKG: Sinus brady, rate 58 bpm. RBBB. LAFB. LVH.   Cardiac cath 07/01/11: Left mainstem: The vessel is heavily calcified, but without obstruction.  Left anterior descending (LAD): The vessel is also heavily calcified. There is moderate bifurcation plaque at the takeoff of the second diagonal. More distally, there is a 60% eccentric area of narrowing that does not appear flow limiting. The large D2 also has some ostial narrowing that does not appear critical.  Left circumflex (LCx): The CFX has a short common trunk then divides to a large OM1 and AV circumflex. Just after the OM there is a calcified high grade stenosis leading to a large OM vessel. There is some plaquing of 50% in the mid OM1 and the 95 % lesion in the AV circ is calcified, and abuts the OM1. The distal AV circ is quite large.  Right coronary artery (RCA): The vessel is calcified and supplies a large PD  and PL branch. There is calcification throughout without critical narrowing. The PDA has some mild ostial narrowing.  Left ventriculography: Left ventricular systolic function is normal, LVEF is estimated at 55-65%, there is no significant mitral regurgitation    Echo June 2013: Left ventricle: The cavity size was normal. Wall thickness was increased in a pattern of moderate to severe LVH. Systolic function was  vigorous. The estimated ejection fraction was in the range of 65% to 70%. Wall motion was normal; there were no regional wall motion abnormalities. Doppler parameters are consistent with abnormal left ventricular relaxation (grade 1 diastolic dysfunction). - Aortic valve: Cusp separation was mildly reduced. Trivial regurgitation. - Mitral valve: Mild regurgitation. - Pulmonary arteries: Systolic pressure was mildly increased. PA peak pressure: 32mm Hg (S).  Assessment and Plan:   1. CAD:  Stable. No chest pain. No changes today. Continue ASA/Plavix but he has been out of Plavix since November 2014. He will refill at the [facility]. He does not wish to have any further cardiac testing.   2. Carotid artery disease: Severe disease in LICA with occluded RICA. Has been seen in [facility] but refuses treatment. He is on ASA and Plavix. No desire for aggressive treatment.   3. HTN: BP controlled. He has had a dry cough on Lisinopril/HCT for months. Will hold the Lisinopril HCT. He will call back in 2 weeks and if cough better, we will start HCTZ 25 mg daily. If cough not improved off of Lisinopril, will restart at previous dosage.   4. Chronic diastolic CHF: He has some LE edema during the day, resolves at night. I have offered to start Lasix but he does not wish to do so at this time. Will call if his LE swelling worsens. He has no dyspnea with exertion. Weight is stable over last year.

## 2013-05-24 NOTE — Patient Instructions (Addendum)
Your physician wants you to follow-up in:  6 months.  You will receive a reminder letter in the mail two months in advance. If you don't receive a letter, please call our office to schedule the follow-up appointment.  Your physician has recommended you make the following change in your medication:   Stop Lisinopril /HCT.   Start Clopidogrel 75 mg by mouth daily.You have a prescription for this to take to the [facility]. [facility] get to the [facility].

## 2013-06-07 DIAGNOSIS — E119 Type 2 diabetes mellitus without complications: Secondary | ICD-10-CM | POA: Insufficient documentation

## 2013-06-07 DIAGNOSIS — Z794 Long term (current) use of insulin: Secondary | ICD-10-CM | POA: Insufficient documentation

## 2013-06-07 DIAGNOSIS — I10 Essential (primary) hypertension: Secondary | ICD-10-CM | POA: Insufficient documentation

## 2013-06-07 DIAGNOSIS — D649 Anemia, unspecified: Secondary | ICD-10-CM | POA: Insufficient documentation

## 2013-06-07 DIAGNOSIS — G9340 Encephalopathy, unspecified: Secondary | ICD-10-CM | POA: Insufficient documentation

## 2013-06-07 DIAGNOSIS — M129 Arthropathy, unspecified: Secondary | ICD-10-CM | POA: Insufficient documentation

## 2013-06-07 DIAGNOSIS — I252 Old myocardial infarction: Secondary | ICD-10-CM | POA: Insufficient documentation

## 2013-06-07 DIAGNOSIS — Z7982 Long term (current) use of aspirin: Secondary | ICD-10-CM | POA: Insufficient documentation

## 2013-06-07 DIAGNOSIS — I779 Disorder of arteries and arterioles, unspecified: Secondary | ICD-10-CM | POA: Insufficient documentation

## 2013-06-07 DIAGNOSIS — Z79899 Other long term (current) drug therapy: Secondary | ICD-10-CM | POA: Insufficient documentation

## 2013-06-07 DIAGNOSIS — Z87891 Personal history of nicotine dependence: Secondary | ICD-10-CM | POA: Insufficient documentation

## 2013-06-08 ENCOUNTER — Emergency Department (HOSPITAL_COMMUNITY)
Admission: EM | Admit: 2013-06-08 | Discharge: 2013-06-08 | Disposition: A | Discharge status: Home / Self Care | Payer: Medicare Other | Attending: Emergency Medicine | Admitting: Emergency Medicine

## 2013-06-08 ENCOUNTER — Encounter (HOSPITAL_COMMUNITY): Payer: Self-pay | Admitting: Emergency Medicine

## 2013-06-08 DIAGNOSIS — R739 Hyperglycemia, unspecified: Secondary | ICD-10-CM

## 2013-06-08 LAB — URINALYSIS, ROUTINE W REFLEX MICROSCOPIC
BILIRUBIN URINE: NEGATIVE
Glucose, UA: >1000 mg/dL — AB
Hgb urine dipstick: NEGATIVE
KETONES UR: NEGATIVE mg/dL
Leukocytes, UA: NEGATIVE
NITRITE: NEGATIVE
Protein, ur: NEGATIVE mg/dL
Specific Gravity, Urine: 1.027 (ref 1.005–1.030)
UROBILINOGEN UA: 0.2 mg/dL (ref 0.0–1.0)
pH: 5 (ref 5.0–8.0)

## 2013-06-08 LAB — CBG MONITORING, ED
GLUCOSE-CAPILLARY: 460 mg/dL — AB (ref 70–99)
Glucose-Capillary: 266 mg/dL — ABNORMAL HIGH (ref 70–99)
Glucose-Capillary: 256 mg/dL — ABNORMAL HIGH (ref 70–99)

## 2013-06-08 LAB — CBC
HCT: 35.6 % — ABNORMAL LOW (ref 39.0–52.0)
Hemoglobin: 12.2 g/dL — ABNORMAL LOW (ref 13.0–17.0)
MCH: 31.1 pg (ref 26.0–34.0)
MCHC: 34.3 g/dL (ref 30.0–36.0)
MCV: 90.8 fL (ref 78.0–100.0)
PLATELETS: 141 10*3/uL — AB (ref 150–400)
RBC: 3.92 MIL/uL — ABNORMAL LOW (ref 4.22–5.81)
RDW: 13.9 % (ref 11.5–15.5)
WBC: 5.1 10*3/uL (ref 4.0–10.5)

## 2013-06-08 LAB — COMPREHENSIVE METABOLIC PANEL
ALK PHOS: 75 U/L (ref 39–117)
ALT: 12 U/L (ref 0–53)
AST: 18 U/L (ref 0–37)
Albumin: 3.3 g/dL — ABNORMAL LOW (ref 3.5–5.2)
BUN: 24 mg/dL — AB (ref 6–23)
CALCIUM: 8.9 mg/dL (ref 8.4–10.5)
CO2: 23 meq/L (ref 19–32)
Chloride: 96 mEq/L (ref 96–112)
Creatinine, Ser: 1.43 mg/dL — ABNORMAL HIGH (ref 0.50–1.35)
GFR, EST AFRICAN AMERICAN: 49 mL/min — AB (ref >90)
GFR, EST NON AFRICAN AMERICAN: 42 mL/min — AB (ref >90)
GLUCOSE: 330 mg/dL — AB (ref 70–99)
POTASSIUM: 4.4 meq/L (ref 3.7–5.3)
SODIUM: 133 meq/L — AB (ref 137–147)
Total Bilirubin: 0.5 mg/dL (ref 0.3–1.2)
Total Protein: 7.1 g/dL (ref 6.0–8.3)

## 2013-06-08 LAB — URINE MICROSCOPIC-ADD ON

## 2013-06-08 LAB — PRO B NATRIURETIC PEPTIDE: PRO B NATRI PEPTIDE: 1248 pg/mL — AB (ref 0–450)

## 2013-06-08 MED ORDER — SODIUM CHLORIDE 0.9 % IV BOLUS (SEPSIS)
500.0000 mL | Freq: Once | INTRAVENOUS | Status: AC
Start: 2013-06-08 — End: 2013-06-08
  Administered 2013-06-08: 500 mL via INTRAVENOUS

## 2013-06-08 NOTE — ED Notes (Signed)
Pt family states that patient has not been acting like himself as far as his blood sugar. Pt family states that he has been fluctuating since Saturday and last dose of levimir was 12-14 units at 20:00 this evening. Pt has been complaining of blurred vision, but not any pain.

## 2013-06-08 NOTE — ED Notes (Signed)
Pt given water to drink. 

## 2013-06-08 NOTE — ED Notes (Signed)
Pt's family member reports his blood sugar has been high sine 20:00 tonight. Per family, "pt is more confused than normal." Pt is able to state name, DOB, and current president correctly. CBG currently 460. NAD at this time.

## 2013-06-08 NOTE — Discharge Instructions (Signed)

## 2013-06-08 NOTE — ED Provider Notes (Signed)
CSN: [id number]     Arrival date & time 06/07/13  2350 History   First MD Initiated Contact with Patient 06/08/13 [phone]     Chief Complaint  Patient presents with  . Hyperglycemia     (Consider location/radiation/quality/duration/timing/severity/associated sxs/prior Treatment) HPI This is an 80 year old male with diabetes. He was brought here by his family because he has been more confused the past 2-3 days. When this occurs it is usually a sign that his blood sugars out of control. They noticed that his blood sugar was in the 300s at home. They state he often does not take his medications at the appropriate times. He often will take his insulin several hours after he threatened them before he eats. His blood sugar was noted to be 460 on arrival and he has been given an IV fluid bolus per protocol. His most recent CBG was 266. Nursing staff noted him to be oriented to person place month and POTUS, but not day of week. He denies being in any pain. He has not been vomiting. He has not had a fever. He was lightheaded on arrival and was walking with a shuffling gait.  Past Medical History  Diagnosis Date  . Hypertension   . Diabetes mellitus   . CAD (coronary artery disease)     a. NSTEMI s/p complex PCI to prox Cx 07/03/11  . Encephalopathy     AMS during 06/2011 hospitalization - MRI of brain without infarct but did show R carotid dz, abnl EEG with nonspecific encephalopathy, felt secondary to drugs  . Carotid arterial disease     a. By MRI 05/2011;  b. 06/2011 Carotid U/S: LICA 80-99%, RICA 100%, patent vertebrals.  . Anemia     a. Felt to be iatrogenic from procedures/sticks 06/2011  . Transient atrial fibrillation or flutter     Brief WCT during hospitalization 06/2011 felt to be afib with abberation, not a good coumadin candidate  . Myocardial infarction   . Arthritis    Past Surgical History  Procedure Laterality Date  . Thumb surgery  1975    left  . Appendectomy  1948   Family  History  Problem Relation Age of Onset  . Diabetes Mother   . Cancer Father   . Cancer Sister   . Cancer Brother   . Cancer Daughter    History  Substance Use Topics  . Smoking status: Former Smoker    Types: Cigarettes    Quit date: 01/13/1986  . Smokeless tobacco: Not on file  . Alcohol Use: No    Review of Systems  All other systems reviewed and are negative.  Allergies  Ativan  Home Medications   Prior to Admission medications   Medication Sig Start Date End Date Taking? Authorizing Provider  clopidogrel (PLAVIX) 75 MG tablet Take 1 tablet (75 mg total) by mouth daily. 05/24/13  Yes Kathleene Hazel, MD  fish oil-omega-3 fatty acids 1000 MG capsule Take 1 g by mouth 3 (three) times daily.    Yes Historical Provider, MD  insulin detemir (LEVEMIR) 100 UNIT/ML injection Inject 12-38 Units into the skin 2 (two) times daily. 38 units with breakfast and 12 units with dinner 07/04/11  Yes Dayna N Dunn, PA-C  acetaminophen (TYLENOL) 325 MG tablet Take 650 mg by mouth every 6 (six) hours as needed.    Historical Provider, MD  aspirin 81 MG tablet Take 1 tablet (81 mg total) by mouth daily. 07/04/11   Dayna N Dunn, PA-C  carvedilol (  COREG) 12.5 MG tablet Take 1/2 tablet by mouth twice a day    Historical Provider, MD  FLUZONE HIGH-DOSE injection  09/14/12   Historical Provider, MD  isosorbide mononitrate (IMDUR) 30 MG 24 hr tablet Take 30 mg by mouth daily after lunch. 07/04/11 07/03/12  Dayna N Dunn, PA-C  ketoconazole (NIZORAL) 2 % cream Apply 1 application topically as needed. For rash 07/28/11   Historical Provider, MD  levothyroxine (SYNTHROID, LEVOTHROID) 50 MCG tablet Take 50 mcg by mouth daily.    Historical Provider, MD  nitroGLYCERIN (NITROSTAT) 0.4 MG SL tablet Place 1 tablet (0.4 mg total) under the tongue every 5 (five) minutes x 3 doses as needed for chest pain. 07/04/11 07/03/12  Dayna N Dunn, PA-C  pantoprazole (PROTONIX) 40 MG tablet Take 40 mg by mouth daily after lunch.      Historical Provider, MD  Specialty Vitamins Products (PROSTATE PO) Take by mouth. Super beta prostate, 600 mg    Historical Provider, MD   BP 133/63  Pulse 63  Temp(Src) 98.1 F (36.7 C) (Oral)  Resp 16  SpO2 97%  Physical Exam General: Well-developed, well-nourished male in no acute distress; appearance consistent with age of record HENT: normocephalic; atraumatic Eyes: pupils equal, round and reactive to light; extraocular muscles intact Neck: supple Heart: regular rate and rhythm Lungs: clear to auscultation bilaterally Abdomen: soft; nondistended; nontender; bowel sounds present Extremities: Arthritic change; 2+ pitting edema of lower Neurologic: Awake, alert and oriented to person, place, month and POTUS; motor function intact in all extremities and symmetric; no facial droop Skin: Warm and dry Psychiatric: Normal mood and affect   ED Course  Procedures (including critical care time)   MDM   Nursing notes and vitals signs, including pulse oximetry, reviewed.  Summary of this visit's results, reviewed by myself:  Labs:  Results for orders placed during the hospital encounter of 06/08/13 (from the past 24 hour(s))  CBG MONITORING, ED     Status: Abnormal   Collection Time    06/08/13 12:38 AM      Result Value Ref Range   Glucose-Capillary 460 (*) 70 - 99 mg/dL   Comment 1 Notify RN     Comment 2 Documented in Chart    COMPREHENSIVE METABOLIC PANEL     Status: Abnormal   Collection Time    06/08/13  1:10 AM      Result Value Ref Range   Sodium 133 (*) 137 - 147 mEq/L   Potassium 4.4  3.7 - 5.3 mEq/L   Chloride 96  96 - 112 mEq/L   CO2 23  19 - 32 mEq/L   Glucose, Bld 330 (*) 70 - 99 mg/dL   BUN 24 (*) 6 - 23 mg/dL   Creatinine, Ser 4.091.43 (*) 0.50 - 1.35 mg/dL   Calcium 8.9  8.4 - 81.110.5 mg/dL   Total Protein 7.1  6.0 - 8.3 g/dL   Albumin 3.3 (*) 3.5 - 5.2 g/dL   AST 18  0 - 37 U/L   ALT 12  0 - 53 U/L   Alkaline Phosphatase 75  39 - 117 U/L   Total  Bilirubin 0.5  0.3 - 1.2 mg/dL   GFR calc non Af Amer 42 (*) >90 mL/min   GFR calc Af Amer 49 (*) >90 mL/min  CBC     Status: Abnormal   Collection Time    06/08/13  1:10 AM      Result Value Ref Range   WBC 5.1  4.0 - 10.5 K/uL   RBC 3.92 (*) 4.22 - 5.81 MIL/uL   Hemoglobin 12.2 (*) 13.0 - 17.0 g/dL   HCT 40.9 (*) 81.1 - 91.4 %   MCV 90.8  78.0 - 100.0 fL   MCH 31.1  26.0 - 34.0 pg   MCHC 34.3  30.0 - 36.0 g/dL   RDW 78.2  95.6 - 21.3 %   Platelets 141 (*) 150 - 400 K/uL  PRO B NATRIURETIC PEPTIDE     Status: Abnormal   Collection Time    06/08/13  2:01 AM      Result Value Ref Range   Pro B Natriuretic peptide (BNP) 1248.0 (*) 0 - 450 pg/mL  URINALYSIS, ROUTINE W REFLEX MICROSCOPIC     Status: Abnormal   Collection Time    06/08/13  4:24 AM      Result Value Ref Range   Color, Urine YELLOW  YELLOW   APPearance CLEAR  CLEAR   Specific Gravity, Urine 1.027  1.005 - 1.030   pH 5.0  5.0 - 8.0   Glucose, UA >1000 (*) NEGATIVE mg/dL   Hgb urine dipstick NEGATIVE  NEGATIVE   Bilirubin Urine NEGATIVE  NEGATIVE   Ketones, ur NEGATIVE  NEGATIVE mg/dL   Protein, ur NEGATIVE  NEGATIVE mg/dL   Urobilinogen, UA 0.2  0.0 - 1.0 mg/dL   Nitrite NEGATIVE  NEGATIVE   Leukocytes, UA NEGATIVE  NEGATIVE  URINE MICROSCOPIC-ADD ON     Status: Abnormal   Collection Time    06/08/13  4:24 AM      Result Value Ref Range   Squamous Epithelial / LPF RARE  RARE   WBC, UA 0-2  <3 WBC/hpf   RBC / HPF 0-2  <3 RBC/hpf   Bacteria, UA RARE  RARE   Casts HYALINE CASTS (*) NEGATIVE  CBG MONITORING, ED     Status: Abnormal   Collection Time    06/08/13  4:30 AM      Result Value Ref Range   Glucose-Capillary 266 (*) 70 - 99 mg/dL   0:86 AM Patient able to ambulate at his normal gait per family. He is no longer lightheaded. Most recent sugar it is 256. He has not had his morning insulin or other medications yet. The patient's grandson and is comfortable taking him home and ensuring that he gets his  appropriate medications this morning. I will also watch his diet as he has a tendency to eat more than he should.      Hanley Seamen, MD 06/08/13 [phone]

## 2013-06-08 NOTE — ED Notes (Signed)
Pt unable to urinate at this time.  

## 2013-06-08 NOTE — ED Notes (Signed)
Pt ambulated to nurse's station and back without assistance. Pt did not feel dizzy, pt looked and was acting like himself.

## 2013-06-16 ENCOUNTER — Telehealth: Payer: Self-pay | Admitting: *Deleted

## 2013-06-16 NOTE — Telephone Encounter (Signed)
I placed call to pt to see if cough has improved. He states it is a little better but still present and productive at times. He has been checking blood pressure but does not know readings. He is going to see primary care today (Dr. Nicholos Johns) about cough. I have asked him to contact us after appt today to let us know about plan from primary care.

## 2013-07-04 NOTE — Telephone Encounter (Signed)
Spoke with pt who reports cough is better. Blood pressure is up and down per pt report. He does not have any readings to report. He will continue to monitor blood pressure and let us know if elevated.

## 2013-09-07 ENCOUNTER — Other Ambulatory Visit: Payer: Self-pay

## 2013-10-13 MED ORDER — CARVEDILOL 12.5 MG PO TABS: 12.5000 mg | ORAL_TABLET | Freq: Two times a day (BID) | ORAL | Status: DC

## 2013-11-21 ENCOUNTER — Encounter: Payer: Self-pay | Admitting: Cardiovascular Disease

## 2013-11-21 ENCOUNTER — Ambulatory Visit (INDEPENDENT_AMBULATORY_CARE_PROVIDER_SITE_OTHER): Payer: Medicare Other | Admitting: Cardiovascular Disease

## 2013-11-21 VITALS — BP 130/62 | HR 56 | Ht 65.0 in | Wt 164.0 lb

## 2013-11-21 DIAGNOSIS — I739 Peripheral vascular disease, unspecified: Secondary | ICD-10-CM

## 2013-11-21 DIAGNOSIS — I10 Essential (primary) hypertension: Secondary | ICD-10-CM

## 2013-11-21 DIAGNOSIS — I2510 Atherosclerotic heart disease of native coronary artery without angina pectoris: Secondary | ICD-10-CM

## 2013-11-21 DIAGNOSIS — I5032 Chronic diastolic (congestive) heart failure: Secondary | ICD-10-CM

## 2013-11-21 DIAGNOSIS — I779 Disorder of arteries and arterioles, unspecified: Secondary | ICD-10-CM

## 2013-11-21 MED ORDER — LEVOTHYROXINE SODIUM 50 MCG PO TABS: 50.0000 ug | ORAL_TABLET | Freq: Every day | ORAL | Status: DC

## 2013-11-21 NOTE — Patient Instructions (Signed)
Your physician wants you to follow-up in:  6 months. You will receive a reminder letter in the mail two months in advance. If you don't receive a letter, please call our office to schedule the follow-up appointment.   

## 2013-11-21 NOTE — Progress Notes (Signed)
History of Present Illness: 80 yo male with history of DM, HTN, CAD, carotid artery stenosis here today for cardiac follow up. He has been followed by Dr. Riley KillStuckey. He has CAD with NSTEMI June 2013 with complex PCI of the Circumflex in June 2013 with 2.5 mm Resolute DES in the proximal Circumflex. His cath showed heavily calcified RCA and LAD with moderate LAD stenosis.  He also has severe carotid artery disease with 100% occlusion of the RICA and high grade disease in the LICA. He has refused to have treatment of this disease. He was seen by Dr. Myra GianottiBrabham in [facility] in July 2013 and discussed treatment options but refused any treatment. Cough improved off of Lisinopril.   He is feeling well. No chest pain. No SOB. Tolerating all meds.  LE edema is better on Lasix. Weight is stable.   Primary Care Physician: Nicholos Johnsamachandran [facility])  Last Lipid Profile: Followed in primary care   Past Medical History  Diagnosis Date  . Hypertension   . Diabetes mellitus   . CAD (coronary artery disease)     a. NSTEMI s/p complex PCI to prox Cx 07/03/11  . Encephalopathy     AMS during 06/2011 hospitalization - MRI of brain without infarct but did show R carotid dz, abnl EEG with nonspecific encephalopathy, felt secondary to drugs  . Carotid arterial disease     a. By MRI 05/2011;  b. 06/2011 Carotid U/S: LICA 80-99%, RICA 100%, patent vertebrals.  . Anemia     a. Felt to be iatrogenic from procedures/sticks 06/2011  . Transient atrial fibrillation or flutter     Brief WCT during hospitalization 06/2011 felt to be afib with abberation, not a good coumadin candidate  . Myocardial infarction   . Arthritis     Past Surgical History  Procedure Laterality Date  . Thumb surgery  1975    left  . Appendectomy  1948    Current Outpatient Prescriptions  Medication Sig Dispense Refill  . acetaminophen (TYLENOL) 325 MG tablet Take 650 mg by mouth every 6 (six) hours as needed.    Marland Kitchen. aspirin 81 MG  tablet Take 1 tablet (81 mg total) by mouth daily.    . carvedilol (COREG) 12.5 MG tablet Take 1 tablet (12.5 mg total) by mouth 2 (two) times daily with a meal. Take 1/2 tablet by mouth twice a day 30 tablet 4  . clopidogrel (PLAVIX) 75 MG tablet Take 1 tablet (75 mg total) by mouth daily. 30 tablet 11  . fish oil-omega-3 fatty acids 1000 MG capsule Take 1 g by mouth 3 (three) times daily.     Marland Kitchen. FLUZONE HIGH-DOSE injection     . furosemide (LASIX) 40 MG tablet   0  . insulin detemir (LEVEMIR) 100 UNIT/ML injection Inject 12-38 Units into the skin 2 (two) times daily. 38 units with breakfast and 12 units with dinner    . ketoconazole (NIZORAL) 2 % cream Apply 1 application topically as needed. For rash    . levothyroxine (SYNTHROID, LEVOTHROID) 50 MCG tablet Take 50 mcg by mouth daily.    . pantoprazole (PROTONIX) 40 MG tablet Take 40 mg by mouth daily after lunch.     . potassium chloride (K-DUR) 10 MEQ tablet   0  . Specialty Vitamins Products (PROSTATE PO) Take by mouth. Super beta prostate, 600 mg    . isosorbide mononitrate (IMDUR) 30 MG 24 hr tablet Take 30 mg by mouth daily after lunch.    .Marland Kitchen  nitroGLYCERIN (NITROSTAT) 0.4 MG SL tablet Place 1 tablet (0.4 mg total) under the tongue every 5 (five) minutes x 3 doses as needed for chest pain. 25 tablet 4   No current facility-administered medications for this visit.    Allergies  Allergen Reactions  . Ativan [Lorazepam] Other (See Comments)    agitation    History   Social History  . Marital Status: Widowed    Spouse Name: N/A    Number of Children: N/A  . Years of Education: N/A   Occupational History  . Not on file.   Social History Main Topics  . Smoking status: Former Smoker    Types: Cigarettes    Quit date: 01/13/1986  . Smokeless tobacco: Not on file  . Alcohol Use: No  . Drug Use: No  . Sexual Activity: Not on file   Other Topics Concern  . Not on file   Social History Narrative    Family History  Problem  Relation Age of Onset  . Diabetes Mother   . Cancer Father   . Cancer Sister   . Cancer Brother   . Cancer Daughter     Review of Systems:  As stated in the HPI and otherwise negative.   BP 130/62 mmHg  Pulse 56  Ht 5\' 5"  (1.651 m)  Wt 164 lb (74.39 kg)  BMI 27.29 kg/m2  Physical Examination: General: Well developed, well nourished, NAD HEENT: OP clear, mucus membranes moist SKIN: warm, dry. No rashes. Neuro: No focal deficits Musculoskeletal: Muscle strength 5/5 all ext Psychiatric: Mood and affect normal Neck: No JVD, no carotid bruits, no thyromegaly, no lymphadenopathy. Lungs:Clear bilaterally, no wheezes, rhonci, crackles Cardiovascular: Regular rate and rhythm. No murmurs, gallops or rubs. Abdomen:Soft. Bowel sounds present. Non-tender.  Extremities: 1+ bilateral  lower extremity edema. Pulses are 2 + in the bilateral DP/PT.   Cardiac cath 07/01/11: Left mainstem: The vessel is heavily calcified, but without obstruction.  Left anterior descending (LAD): The vessel is also heavily calcified. There is moderate bifurcation plaque at the takeoff of the second diagonal. More distally, there is a 60% eccentric area of narrowing that does not appear flow limiting. The large D2 also has some ostial narrowing that does not appear critical.  Left circumflex (LCx): The CFX has a short common trunk then divides to a large OM1 and AV circumflex. Just after the OM there is a calcified high grade stenosis leading to a large OM vessel. There is some plaquing of 50% in the mid OM1 and the 95 % lesion in the AV circ is calcified, and abuts the OM1. The distal AV circ is quite large.  Right coronary artery (RCA): The vessel is calcified and supplies a large PD and PL branch. There is calcification throughout without critical narrowing. The PDA has some mild ostial narrowing.  Left ventriculography: Left ventricular systolic function is normal, LVEF is estimated at 55-65%, there is no significant  mitral regurgitation    Echo June 2013: Left ventricle: The cavity size was normal. Wall thickness was increased in a pattern of moderate to severe LVH. Systolic function was vigorous. The estimated ejection fraction was in the range of 65% to 70%. Wall motion was normal; there were no regional wall motion abnormalities. Doppler parameters are consistent with abnormal left ventricular relaxation (grade 1 diastolic dysfunction). - Aortic valve: Cusp separation was mildly reduced. Trivial regurgitation. - Mitral valve: Mild regurgitation. - Pulmonary arteries: Systolic pressure was mildly increased. PA peak pressure: 32mm Hg (S).  Assessment and Plan:   1. CAD:  Stable. No chest pain. No changes today. Continue ASA/Plavix. He will refill at the [place]. He does not wish to have any further cardiac testing.   2. Carotid artery disease: Severe disease in LICA with occluded RICA. Has been seen in [facility] but refuses treatment. He is on ASA and Plavix. No desire for aggressive treatment.   3. HTN: BP controlled.    4. Chronic diastolic CHF: Weight is stable. Continue Lasix.    5. Hypothyroidism: Will refill his Synthroid today.

## 2013-12-22 ENCOUNTER — Encounter (HOSPITAL_COMMUNITY): Payer: Self-pay | Admitting: Cardiology

## 2014-02-17 DIAGNOSIS — E782 Mixed hyperlipidemia: Secondary | ICD-10-CM | POA: Diagnosis not present

## 2014-02-17 DIAGNOSIS — E119 Type 2 diabetes mellitus without complications: Secondary | ICD-10-CM | POA: Diagnosis not present

## 2014-02-17 DIAGNOSIS — I2510 Atherosclerotic heart disease of native coronary artery without angina pectoris: Secondary | ICD-10-CM | POA: Diagnosis not present

## 2014-02-24 DIAGNOSIS — E782 Mixed hyperlipidemia: Secondary | ICD-10-CM | POA: Diagnosis not present

## 2014-02-24 DIAGNOSIS — Z0000 Encounter for general adult medical examination without abnormal findings: Secondary | ICD-10-CM | POA: Diagnosis not present

## 2014-02-24 DIAGNOSIS — I10 Essential (primary) hypertension: Secondary | ICD-10-CM | POA: Diagnosis not present

## 2014-02-24 DIAGNOSIS — E1121 Type 2 diabetes mellitus with diabetic nephropathy: Secondary | ICD-10-CM | POA: Diagnosis not present

## 2014-04-13 DIAGNOSIS — Z794 Long term (current) use of insulin: Secondary | ICD-10-CM | POA: Diagnosis not present

## 2014-04-13 DIAGNOSIS — Z9641 Presence of insulin pump (external) (internal): Secondary | ICD-10-CM | POA: Diagnosis not present

## 2014-04-13 DIAGNOSIS — E109 Type 1 diabetes mellitus without complications: Secondary | ICD-10-CM | POA: Diagnosis not present

## 2014-06-22 ENCOUNTER — Other Ambulatory Visit: Payer: Self-pay | Admitting: *Deleted

## 2014-06-22 ENCOUNTER — Ambulatory Visit (INDEPENDENT_AMBULATORY_CARE_PROVIDER_SITE_OTHER): Payer: Medicare Other | Admitting: Cardiovascular Disease

## 2014-06-22 ENCOUNTER — Encounter: Payer: Self-pay | Admitting: Cardiovascular Disease

## 2014-06-22 VITALS — BP 120/48 | HR 56 | Ht 64.0 in | Wt 167.4 lb

## 2014-06-22 DIAGNOSIS — I10 Essential (primary) hypertension: Secondary | ICD-10-CM

## 2014-06-22 DIAGNOSIS — I2510 Atherosclerotic heart disease of native coronary artery without angina pectoris: Secondary | ICD-10-CM | POA: Diagnosis not present

## 2014-06-22 DIAGNOSIS — I5032 Chronic diastolic (congestive) heart failure: Secondary | ICD-10-CM | POA: Diagnosis not present

## 2014-06-22 DIAGNOSIS — I779 Disorder of arteries and arterioles, unspecified: Secondary | ICD-10-CM

## 2014-06-22 DIAGNOSIS — I739 Peripheral vascular disease, unspecified: Principal | ICD-10-CM

## 2014-06-22 MED ORDER — LEVOTHYROXINE SODIUM 50 MCG PO TABS: 50.0000 ug | ORAL_TABLET | Freq: Every day | ORAL | Status: DC

## 2014-06-22 NOTE — Progress Notes (Signed)
Chief Complaint  Patient presents with  . Follow-up    History of Present Illness: 81 yo male with history of DM, HTN, CAD, carotid artery stenosis here today for cardiac follow up. He has been followed by Dr. Riley Kill. He has CAD with NSTEMI June 2013 with complex PCI of the Circumflex in June 2013 with 2.5 mm Resolute DES in the proximal Circumflex. His cath showed heavily calcified RCA and LAD with moderate LAD stenosis.  He also has severe carotid artery disease with 100% occlusion of the RICA and high grade disease in the LICA. He has refused to have treatment of this disease. He was seen by Dr. Myra Gianotti in [facility] in July 2013 and discussed treatment options but refused any treatment. Cough improved off of Lisinopril.   He is feeling well. No chest pain. No SOB. Tolerating all meds.  LE edema is stable on Lasix. Weight is stable.   Primary Care Physician: Nicholos Johns [facility])  Last Lipid Profile: Followed in primary care   Past Medical History  Diagnosis Date  . Hypertension   . Diabetes mellitus   . CAD (coronary artery disease)     a. NSTEMI s/p complex PCI to prox Cx 07/03/11  . Encephalopathy     AMS during 06/2011 hospitalization - MRI of brain without infarct but did show R carotid dz, abnl EEG with nonspecific encephalopathy, felt secondary to drugs  . Carotid arterial disease     a. By MRI 05/2011;  b. 06/2011 Carotid U/S: LICA 80-99%, RICA 100%, patent vertebrals.  . Anemia     a. Felt to be iatrogenic from procedures/sticks 06/2011  . Transient atrial fibrillation or flutter     Brief WCT during hospitalization 06/2011 felt to be afib with abberation, not a good coumadin candidate  . Myocardial infarction   . Arthritis     Past Surgical History  Procedure Laterality Date  . Thumb surgery  1975    left  . Appendectomy  1948  . Left heart catheterization with coronary angiogram N/A 07/01/2011    Procedure: LEFT HEART CATHETERIZATION WITH CORONARY  ANGIOGRAM;  Surgeon: Herby Abraham, MD;  Location: [facility] CATH LAB;  Service: Cardiovascular;  Laterality: N/A;  . Percutaneous coronary stent intervention (pci-s) N/A 07/03/2011    Procedure: PERCUTANEOUS CORONARY STENT INTERVENTION (PCI-S);  Surgeon: Herby Abraham, MD;  Location: [facility] CATH LAB;  Service: Cardiovascular;  Laterality: N/A;    Current Outpatient Prescriptions  Medication Sig Dispense Refill  . acetaminophen (TYLENOL) 325 MG tablet Take 650 mg by mouth every 6 (six) hours as needed.    Marland Kitchen aspirin 81 MG tablet Take 1 tablet (81 mg total) by mouth daily.    . carvedilol (COREG) 12.5 MG tablet Take 1 tablet (12.5 mg total) by mouth 2 (two) times daily with a meal. Take 1/2 tablet by mouth twice a day 30 tablet 4  . clopidogrel (PLAVIX) 75 MG tablet Take 1 tablet (75 mg total) by mouth daily. 30 tablet 11  . fish oil-omega-3 fatty acids 1000 MG capsule Take 1 g by mouth 3 (three) times daily.     Marland Kitchen FLUZONE HIGH-DOSE injection     . furosemide (LASIX) 40 MG tablet   0  . insulin detemir (LEVEMIR) 100 UNIT/ML injection Inject 12-38 Units into the skin 2 (two) times daily. 38 units with breakfast and 12 units with dinner    . isosorbide mononitrate (IMDUR) 30 MG 24 hr tablet Take 30 mg by mouth daily after  lunch.    . ketoconazole (NIZORAL) 2 % cream Apply 1 application topically as needed. For rash    . levothyroxine (SYNTHROID, LEVOTHROID) 50 MCG tablet Take 1 tablet (50 mcg total) by mouth daily. 30 tablet 11  . nitroGLYCERIN (NITROSTAT) 0.4 MG SL tablet Place 1 tablet (0.4 mg total) under the tongue every 5 (five) minutes x 3 doses as needed for chest pain. 25 tablet 4  . pantoprazole (PROTONIX) 40 MG tablet Take 40 mg by mouth daily after lunch.     . potassium chloride (K-DUR) 10 MEQ tablet   0  . simvastatin (ZOCOR) 40 MG tablet Take 40 mg by mouth daily. Pt takes 1/2 tablet at bedtime    . Specialty Vitamins Products (PROSTATE PO) Take by mouth. Super beta prostate, 600 mg      No current facility-administered medications for this visit.    Allergies  Allergen Reactions  . Ativan [Lorazepam] Other (See Comments)    agitation    History   Social History  . Marital Status: Widowed    Spouse Name: N/A  . Number of Children: N/A  . Years of Education: N/A   Occupational History  . Not on file.   Social History Main Topics  . Smoking status: Former Smoker    Types: Cigarettes    Quit date: 01/13/1986  . Smokeless tobacco: Not on file  . Alcohol Use: No  . Drug Use: No  . Sexual Activity: Not on file   Other Topics Concern  . Not on file   Social History Narrative    Family History  Problem Relation Age of Onset  . Diabetes Mother   . Cancer Father   . Cancer Sister   . Cancer Brother   . Cancer Daughter     Review of Systems:  As stated in the HPI and otherwise negative.   BP 120/48 mmHg  Pulse 56  Ht  (1.626 m)  Wt 167 lb 6.4 oz (75.932 kg)  BMI 28.72 kg/m2  Physical Examination: General: Well developed, well nourished, NAD HEENT: OP clear, mucus membranes moist SKIN: warm, dry. No rashes. Neuro: No focal deficits Musculoskeletal: Muscle strength 5/5 all ext Psychiatric: Mood and affect normal Neck: No JVD, no carotid bruits, no thyromegaly, no lymphadenopathy. Lungs:Clear bilaterally, no wheezes, rhonci, crackles Cardiovascular: Regular rate and rhythm. No murmurs, gallops or rubs. Abdomen:Soft. Bowel sounds present. Non-tender.  Extremities: 1+ bilateral  lower extremity edema. Pulses are 2 + in the bilateral DP/PT.   Cardiac cath 07/01/11: Left mainstem: The vessel is heavily calcified, but without obstruction.  Left anterior descending (LAD): The vessel is also heavily calcified. There is moderate bifurcation plaque at the takeoff of the second diagonal. More distally, there is a 60% eccentric area of narrowing that does not appear flow limiting. The large D2 also has some ostial narrowing that does not appear  critical.  Left circumflex (LCx): The CFX has a short common trunk then divides to a large OM1 and AV circumflex. Just after the OM there is a calcified high grade stenosis leading to a large OM vessel. There is some plaquing of 50% in the mid OM1 and the 95 % lesion in the AV circ is calcified, and abuts the OM1. The distal AV circ is quite large.  Right coronary artery (RCA): The vessel is calcified and supplies a large PD and PL branch. There is calcification throughout without critical narrowing. The PDA has some mild ostial narrowing.  Left ventriculography: Left  ventricular systolic function is normal, LVEF is estimated at 55-65%, there is no significant mitral regurgitation   Echo June 2013: Left ventricle: The cavity size was normal. Wall thickness was increased in a pattern of moderate to severe LVH. Systolic function was vigorous. The estimated ejection fraction was in the range of 65% to 70%. Wall motion was normal; there were no regional wall motion abnormalities. Doppler parameters are consistent with abnormal left ventricular relaxation (grade 1 diastolic dysfunction). - Aortic valve: Cusp separation was mildly reduced. Trivial regurgitation. - Mitral valve: Mild regurgitation. - Pulmonary arteries: Systolic pressure was mildly increased. PA peak pressure: 32mm Hg (S).  EKG:  EKG is ordered today. The ekg ordered today demonstrates Sinus brady, rate 56 bpm. 1st degree AV block. RBBB. LAFB. LVH  Recent Labs: No results found for requested labs within last 365 days.   Lipid Panel    Component Value Date/Time   CHOL 123 10/20/2011 1112   TRIG 62.0 10/20/2011 1112   HDL 50.70 10/20/2011 1112   CHOLHDL 2 10/20/2011 1112   VLDL 12.4 10/20/2011 1112   LDLCALC 60 10/20/2011 1112     Wt Readings from Last 3 Encounters:  06/22/14 167 lb 6.4 oz (75.932 kg)  11/21/13 164 lb (74.39 kg)  05/24/13 181 lb (82.101 kg)     Other studies Reviewed: Additional studies/ records  that were reviewed today include: . Review of the above records demonstrates:    Assessment and Plan:   1. CAD:  Stable. No chest pain. No changes today. Continue ASA/Plavix. He will refill at the [place]. He does not wish to have any further cardiac testing.   2. Carotid artery disease: Severe disease in LICA with occluded RICA. Has been seen in [facility] but refuses treatment. He is on ASA and Plavix. No desire for aggressive treatment.   3. HTN: BP controlled.  No changes.   4. Chronic diastolic CHF: Weight is stable. Continue Lasix 40 mg daily.   5. Hypothyroidism: Will refill his Synthroid today. This should be followed in primary care.   Current medicines are reviewed at length with the patient today.  The patient does not have concerns regarding medicines.  The following changes have been made:  no change  Labs/ tests ordered today include:  No orders of the defined types were placed in this encounter.    Disposition:   FU with me in 12  months  Signed, Verne Carrow, MD 06/22/2014 10:36 AM    [facility] [address] [place], [place], [place]  [address] Phone: [phone]; Fax: [phone]

## 2014-06-22 NOTE — Patient Instructions (Signed)
Medication Instructions:  Your physician recommends that you continue on your current medications as directed. Please refer to the Current Medication list given to you today.   Labwork: none  Testing/Procedures: none  Follow-Up: Your physician wants you to follow-up in:  12 months.  You will receive a reminder letter in the mail two months in advance. If you don't receive a letter, please call our office to schedule the follow-up appointment.        

## 2014-07-11 DIAGNOSIS — L853 Xerosis cutis: Secondary | ICD-10-CM | POA: Diagnosis not present

## 2014-07-19 DIAGNOSIS — E1165 Type 2 diabetes mellitus with hyperglycemia: Secondary | ICD-10-CM | POA: Diagnosis not present

## 2014-07-19 DIAGNOSIS — E782 Mixed hyperlipidemia: Secondary | ICD-10-CM | POA: Diagnosis not present

## 2014-07-26 DIAGNOSIS — L0390 Cellulitis, unspecified: Secondary | ICD-10-CM | POA: Diagnosis not present

## 2014-07-26 DIAGNOSIS — I10 Essential (primary) hypertension: Secondary | ICD-10-CM | POA: Diagnosis not present

## 2014-07-26 DIAGNOSIS — E782 Mixed hyperlipidemia: Secondary | ICD-10-CM | POA: Diagnosis not present

## 2014-07-26 DIAGNOSIS — E1121 Type 2 diabetes mellitus with diabetic nephropathy: Secondary | ICD-10-CM | POA: Diagnosis not present

## 2014-09-07 DIAGNOSIS — E11329 Type 2 diabetes mellitus with mild nonproliferative diabetic retinopathy without macular edema: Secondary | ICD-10-CM | POA: Diagnosis not present

## 2014-09-07 DIAGNOSIS — H43813 Vitreous degeneration, bilateral: Secondary | ICD-10-CM | POA: Diagnosis not present

## 2014-09-07 DIAGNOSIS — H34832 Tributary (branch) retinal vein occlusion, left eye: Secondary | ICD-10-CM | POA: Diagnosis not present

## 2014-09-07 DIAGNOSIS — Z961 Presence of intraocular lens: Secondary | ICD-10-CM | POA: Diagnosis not present

## 2014-09-21 ENCOUNTER — Telehealth: Payer: Self-pay | Admitting: Cardiovascular Disease

## 2014-09-21 NOTE — Telephone Encounter (Signed)
NewMessage  Rn from [facility] calling to see if pt's dx would warrant a monitored program to scale. She also needed the pt's HR, BP and EF. Please call back and discuss.

## 2014-11-14 DIAGNOSIS — I10 Essential (primary) hypertension: Secondary | ICD-10-CM | POA: Diagnosis not present

## 2014-11-14 DIAGNOSIS — E1121 Type 2 diabetes mellitus with diabetic nephropathy: Secondary | ICD-10-CM | POA: Diagnosis not present

## 2014-11-14 DIAGNOSIS — E782 Mixed hyperlipidemia: Secondary | ICD-10-CM | POA: Diagnosis not present

## 2014-11-22 DIAGNOSIS — E1121 Type 2 diabetes mellitus with diabetic nephropathy: Secondary | ICD-10-CM | POA: Diagnosis not present

## 2014-11-22 DIAGNOSIS — E782 Mixed hyperlipidemia: Secondary | ICD-10-CM | POA: Diagnosis not present

## 2014-11-22 DIAGNOSIS — I2510 Atherosclerotic heart disease of native coronary artery without angina pectoris: Secondary | ICD-10-CM | POA: Diagnosis not present

## 2014-11-22 DIAGNOSIS — I10 Essential (primary) hypertension: Secondary | ICD-10-CM | POA: Diagnosis not present

## 2014-12-20 DIAGNOSIS — Z794 Long term (current) use of insulin: Secondary | ICD-10-CM | POA: Diagnosis not present

## 2014-12-20 DIAGNOSIS — E782 Mixed hyperlipidemia: Secondary | ICD-10-CM | POA: Diagnosis not present

## 2014-12-20 DIAGNOSIS — E1165 Type 2 diabetes mellitus with hyperglycemia: Secondary | ICD-10-CM | POA: Diagnosis not present

## 2014-12-20 DIAGNOSIS — I10 Essential (primary) hypertension: Secondary | ICD-10-CM | POA: Diagnosis not present

## 2015-01-23 ENCOUNTER — Telehealth: Payer: Self-pay | Admitting: Cardiovascular Disease

## 2015-01-23 NOTE — Telephone Encounter (Signed)
[phone]( [phone]) calling wanting to know if he is a candidate for CHF program. Lm that Dr. Sanjuana KavaMcAlhaney not in office today but will forward to him and his nurse [name],RN.

## 2015-01-23 NOTE — Telephone Encounter (Signed)
I am not sure what the criteria are for the [facility]  CHF program but from my standpoint he should be a good candidate for someone from their program to keep an eye on. Levi Robinson

## 2015-01-23 NOTE — Telephone Encounter (Signed)
New message    [facility] calling wants to know if patient a candidate for heart failure program.

## 2015-01-24 NOTE — Telephone Encounter (Signed)
Spoke with Levi Robinson and gave her information form Dr. Clifton JamesMcAlhany. I told Levi Robinson pt does have a diagnosis of CHF

## 2015-03-20 DIAGNOSIS — I2510 Atherosclerotic heart disease of native coronary artery without angina pectoris: Secondary | ICD-10-CM | POA: Diagnosis not present

## 2015-03-20 DIAGNOSIS — E782 Mixed hyperlipidemia: Secondary | ICD-10-CM | POA: Diagnosis not present

## 2015-03-20 DIAGNOSIS — E1121 Type 2 diabetes mellitus with diabetic nephropathy: Secondary | ICD-10-CM | POA: Diagnosis not present

## 2015-03-20 DIAGNOSIS — I10 Essential (primary) hypertension: Secondary | ICD-10-CM | POA: Diagnosis not present

## 2015-05-07 DIAGNOSIS — E1121 Type 2 diabetes mellitus with diabetic nephropathy: Secondary | ICD-10-CM | POA: Diagnosis not present

## 2015-05-07 DIAGNOSIS — E782 Mixed hyperlipidemia: Secondary | ICD-10-CM | POA: Diagnosis not present

## 2015-05-07 DIAGNOSIS — I10 Essential (primary) hypertension: Secondary | ICD-10-CM | POA: Diagnosis not present

## 2015-05-21 DIAGNOSIS — H04123 Dry eye syndrome of bilateral lacrimal glands: Secondary | ICD-10-CM | POA: Diagnosis not present

## 2015-05-21 DIAGNOSIS — Z961 Presence of intraocular lens: Secondary | ICD-10-CM | POA: Diagnosis not present

## 2015-05-21 DIAGNOSIS — E113299 Type 2 diabetes mellitus with mild nonproliferative diabetic retinopathy without macular edema, unspecified eye: Secondary | ICD-10-CM | POA: Diagnosis not present

## 2015-05-21 DIAGNOSIS — H348392 Tributary (branch) retinal vein occlusion, unspecified eye, stable: Secondary | ICD-10-CM | POA: Diagnosis not present

## 2015-05-22 ENCOUNTER — Telehealth: Payer: Self-pay | Admitting: Cardiovascular Disease

## 2015-05-22 NOTE — Telephone Encounter (Signed)
Walk in pt form-[facility] Dro[facility] off [facility]-gave to [place]

## 2015-05-22 NOTE — Telephone Encounter (Signed)
Walk in pt form-[facility] Form-gave to pat

## 2015-06-05 ENCOUNTER — Telehealth: Payer: Self-pay | Admitting: Cardiovascular Disease

## 2015-06-05 NOTE — Telephone Encounter (Signed)
LVM for [facility] ready for pick up.

## 2015-07-03 DIAGNOSIS — I10 Essential (primary) hypertension: Secondary | ICD-10-CM | POA: Diagnosis not present

## 2015-07-03 DIAGNOSIS — E1121 Type 2 diabetes mellitus with diabetic nephropathy: Secondary | ICD-10-CM | POA: Diagnosis not present

## 2015-07-03 DIAGNOSIS — E782 Mixed hyperlipidemia: Secondary | ICD-10-CM | POA: Diagnosis not present

## 2015-07-10 DIAGNOSIS — E782 Mixed hyperlipidemia: Secondary | ICD-10-CM | POA: Diagnosis not present

## 2015-07-10 DIAGNOSIS — E1121 Type 2 diabetes mellitus with diabetic nephropathy: Secondary | ICD-10-CM | POA: Diagnosis not present

## 2015-07-10 DIAGNOSIS — I10 Essential (primary) hypertension: Secondary | ICD-10-CM | POA: Diagnosis not present

## 2015-09-06 ENCOUNTER — Ambulatory Visit: Payer: Medicare Other | Admitting: Cardiovascular Disease

## 2015-09-06 NOTE — Progress Notes (Deleted)
No chief complaint on file.   History of Present Illness: 82 yo male with history of DM, HTN, CAD, carotid artery stenosis here today for cardiac follow up. He has been followed by Dr. Riley Kill. He has CAD with NSTEMI June 2013 with complex PCI of the Circumflex in June 2013 with 2.5 mm Resolute DES in the proximal Circumflex. His cath showed heavily calcified RCA and LAD with moderate LAD stenosis.  He also has severe carotid artery disease with 100% occlusion of the RICA and high grade disease in the LICA. He has refused to have treatment of this disease. He was seen by Dr. Myra Gianotti in [facility] in July 2013 and discussed treatment options but refused any treatment. Cough improved off of Lisinopril.   He is feeling well. No chest pain. No SOB. Tolerating all meds.  LE edema is stable on Lasix. Weight is stable.   Primary Care Physician: Georgianne Fick, MD  Past Medical History:  Diagnosis Date  . Anemia    a. Felt to be iatrogenic from procedures/sticks 06/2011  . Arthritis   . CAD (coronary artery disease)    a. NSTEMI s/p complex PCI to prox Cx 07/03/11  . Carotid arterial disease    a. By MRI 05/2011;  b. 06/2011 Carotid U/S: LICA 80-99%, RICA 100%, patent vertebrals.  . Diabetes mellitus   . Encephalopathy    AMS during 06/2011 hospitalization - MRI of brain without infarct but did show R carotid dz, abnl EEG with nonspecific encephalopathy, felt secondary to drugs  . Hypertension   . Myocardial infarction (HCC)   . Transient atrial fibrillation or flutter    Brief WCT during hospitalization 06/2011 felt to be afib with abberation, not a good coumadin candidate    Past Surgical History:  Procedure Laterality Date  . APPENDECTOMY  1948  . LEFT HEART CATHETERIZATION WITH CORONARY ANGIOGRAM N/A 07/01/2011   Procedure: LEFT HEART CATHETERIZATION WITH CORONARY ANGIOGRAM;  Surgeon: Herby Abraham, MD;  Location: [facility] CATH LAB;  Service: Cardiovascular;  Laterality: N/A;  . PERCUTANEOUS  CORONARY STENT INTERVENTION (PCI-S) N/A 07/03/2011   Procedure: PERCUTANEOUS CORONARY STENT INTERVENTION (PCI-S);  Surgeon: Herby Abraham, MD;  Location[facility] C[facility] LAB;  Service: Cardiovascular;  Laterality: N/A;  . thumb surgery  1975   left    Current Outpatient Prescriptions  Medication Sig Dispense Refill  . acetaminophen (TYLENOL) 325 MG tablet Take 650 mg by mouth every 6 (six) hours as needed.    Marland Kitchen aspirin 81 MG tablet Take 1 tablet (81 mg total) by mouth daily.    . carvedilol (COREG) 12.5 MG tablet Take 1 tablet (12.5 mg total) by mouth 2 (two) times daily with a meal. Take 1/2 tablet by mouth twice a day 30 tablet 4  . clopidogrel (PLAVIX) 75 MG tablet Take 1 tablet (75 mg total) by mouth daily. 30 tablet 11  . fish oil-omega-3 fatty acids 1000 MG capsule Take 1 g by mouth 3 (three) times daily.     Marland Kitchen FLUZONE HIGH-DOSE injection     . furosemide (LASIX) 40 MG tablet   0  . insulin detemir (LEVEMIR) 100 UNIT/ML injection Inject 12-38 Units into the skin 2 (two) times daily. 38 units with breakfast and 12 units with dinner    . isosorbide mononitrate (IMDUR) 30 MG 24 hr tablet Take 30 mg by mouth daily after lunch.    . ketoconazole (NIZORAL) 2 % cream Apply 1 application topically as needed. For rash    . levothyroxine (  SYNTHROID, LEVOTHROID) 50 MCG tablet Take 1 tablet (50 mcg total) by mouth daily. 30 tablet 3  . nitroGLYCERIN (NITROSTAT) 0.4 MG SL tablet Place 1 tablet (0.4 mg total) under the tongue every 5 (five) minutes x 3 doses as needed for chest pain. 25 tablet 4  . pantoprazole (PROTONIX) 40 MG tablet Take 40 mg by mouth daily after lunch.     . potassium chloride (K-DUR) 10 MEQ tablet   0  . simvastatin (ZOCOR) 40 MG tablet Take 40 mg by mouth daily. Pt takes 1/2 tablet at bedtime    . Specialty Vitamins Products (PROSTATE PO) Take by mouth. Super beta prostate, 600 mg     No current facility-administered medications for this visit.     Allergies  Allergen Reactions    . Ativan [Lorazepam] Other (See Comments)    agitation    Social History   Social History  . Marital status: Widowed    Spouse name: N/A  . Number of children: N/A  . Years of education: N/A   Occupational History  . Not on file.   Social History Main Topics  . Smoking status: Former Smoker    Types: Cigarettes    Quit date: 01/13/1986  . Smokeless tobacco: Not on file  . Alcohol use No  . Drug use: No  . Sexual activity: Not on file   Other Topics Concern  . Not on file   Social History Narrative  . No narrative on file    Family History  Problem Relation Age of Onset  . Diabetes Mother   . Cancer Father   . Cancer Sister   . Cancer Brother   . Cancer Daughter     Review of Systems:  As stated in the HPI and otherwise negative.   There were no vitals taken for this visit.  Physical Examination: General: Well developed, well nourished, NAD  HEENT: OP clear, mucus membranes moist  SKIN: warm, dry. No rashes. Neuro: No focal deficits  Musculoskeletal: Muscle strength 5/5 all ext  Psychiatric: Mood and affect normal  Neck: No JVD, no carotid bruits, no thyromegaly, no lymphadenopathy.  Lungs:Clear bilaterally, no wheezes, rhonci, crackles Cardiovascular: Regular rate and rhythm. No murmurs, gallops or rubs. Abdomen:Soft. Bowel sounds present. Non-tender.  Extremities: 1+ bilateral  lower extremity edema. Pulses are 2 + in the bilateral DP/PT.   Cardiac cath 07/01/11: Left mainstem: The vessel is heavily calcified, but without obstruction.  Left anterior descending (LAD): The vessel is also heavily calcified. There is moderate bifurcation plaque at the takeoff of the second diagonal. More distally, there is a 60% eccentric area of narrowing that does not appear flow limiting. The large D2 also has some ostial narrowing that does not appear critical.  Left circumflex (LCx): The CFX has a short common trunk then divides to a large OM1 and AV circumflex. Just  after the OM there is a calcified high grade stenosis leading to a large OM vessel. There is some plaquing of 50% in the mid OM1 and the 95 % lesion in the AV circ is calcified, and abuts the OM1. The distal AV circ is quite large.  Right coronary artery (RCA): The vessel is calcified and supplies a large PD and PL branch. There is calcification throughout without critical narrowing. The PDA has some mild ostial narrowing.  Left ventriculography: Left ventricular systolic function is normal, LVEF is estimated at 55-65%, there is no significant mitral regurgitation   Echo June 2013: Left ventricle: The  cavity size was normal. Wall thickness was increased in a pattern of moderate to severe LVH. Systolic function was vigorous. The estimated ejection fraction was in the range of 65% to 70%. Wall motion was normal; there were no regional wall motion abnormalities. Doppler parameters are consistent with abnormal left ventricular relaxation (grade 1 diastolic dysfunction). - Aortic valve: Cusp separation was mildly reduced. Trivial regurgitation. - Mitral valve: Mild regurgitation. - Pulmonary arteries: Systolic pressure was mildly increased. PA peak pressure: 32mm Hg (S).  EKG:  EKG is *** ordered today. The ekg ordered today demonstrates   Recent Labs: No results found for requested labs within last 8760 hours.   Lipid Panel    Component Value Date/Time   CHOL 123 10/20/2011 1112   TRIG 62.0 10/20/2011 1112   HDL 50.70 10/20/2011 1112   CHOLHDL 2 10/20/2011 1112   VLDL 12.4 10/20/2011 1112   LDLCALC 60 10/20/2011 1112     Wt Readings from Last 3 Encounters:  06/22/14 167 lb 6.4 oz (75.9 kg)  11/21/13 164 lb (74.4 kg)  05/24/13 181 lb (82.1 kg)     Other studies Reviewed: Additional studies/ records that were reviewed today include: . Review of the above records demonstrates:    Assessment and Plan:   1. CAD:  Stable. No chest pain. No changes today. Continue ASA/Plavix. He  will refill at the [place]. He does not wish to have any further cardiac testing.   2. Carotid artery disease: Severe disease in LICA with occluded RICA. Has been seen in [facility] but refuses treatment. He is on ASA and Plavix. No desire for aggressive treatment.   3. HTN: BP controlled.  No changes.   4. Chronic diastolic CHF: Weight is stable. Continue Lasix 40 mg daily.   5. Hypothyroidism: *** Will refill his Synthroid today. This should be followed in primary care.   Current medicines are reviewed at length with the patient today.  The patient does not have concerns regarding medicines.  The following changes have been made:  no change  Labs/ tests ordered today include:  No orders of the defined types were placed in this encounter.   Disposition:   FU with me in 12  months  Signed, Verne Carrow, MD 09/06/2015 8:32 AM    [facility] [address] [place], [place], [place]  [address] Phone: [phone]; Fax: [phone]

## 2015-11-12 DIAGNOSIS — E782 Mixed hyperlipidemia: Secondary | ICD-10-CM | POA: Diagnosis not present

## 2015-11-12 DIAGNOSIS — I2510 Atherosclerotic heart disease of native coronary artery without angina pectoris: Secondary | ICD-10-CM | POA: Diagnosis not present

## 2015-11-12 DIAGNOSIS — M7021 Olecranon bursitis, right elbow: Secondary | ICD-10-CM | POA: Diagnosis not present

## 2015-11-12 DIAGNOSIS — J069 Acute upper respiratory infection, unspecified: Secondary | ICD-10-CM | POA: Diagnosis not present

## 2016-03-20 ENCOUNTER — Encounter: Payer: Self-pay | Admitting: Cardiovascular Disease

## 2016-03-27 ENCOUNTER — Encounter (INDEPENDENT_AMBULATORY_CARE_PROVIDER_SITE_OTHER): Payer: Self-pay

## 2016-03-27 ENCOUNTER — Encounter: Payer: Self-pay | Admitting: Cardiovascular Disease

## 2016-03-27 ENCOUNTER — Ambulatory Visit (INDEPENDENT_AMBULATORY_CARE_PROVIDER_SITE_OTHER): Payer: Medicare Other | Admitting: Cardiovascular Disease

## 2016-03-27 VITALS — BP 130/54 | HR 74 | Ht 65.0 in | Wt 171.4 lb

## 2016-03-27 DIAGNOSIS — I10 Essential (primary) hypertension: Secondary | ICD-10-CM | POA: Diagnosis not present

## 2016-03-27 DIAGNOSIS — I2510 Atherosclerotic heart disease of native coronary artery without angina pectoris: Secondary | ICD-10-CM

## 2016-03-27 DIAGNOSIS — I779 Disorder of arteries and arterioles, unspecified: Secondary | ICD-10-CM | POA: Diagnosis not present

## 2016-03-27 DIAGNOSIS — I5033 Acute on chronic diastolic (congestive) heart failure: Secondary | ICD-10-CM | POA: Diagnosis not present

## 2016-03-27 DIAGNOSIS — I739 Peripheral vascular disease, unspecified: Secondary | ICD-10-CM

## 2016-03-27 MED ORDER — FUROSEMIDE 40 MG PO TABS: 40.0000 mg | ORAL_TABLET | Freq: Two times a day (BID) | ORAL | 6 refills | Status: DC

## 2016-03-27 NOTE — Progress Notes (Signed)
Chief Complaint  Patient presents with  . bilateral carotid artery disease    History of Present Illness: 83 yo male with history of DM, HTN, CAD, carotid artery stenosis here today for cardiac follow up. He had been followed by Dr. Riley Kill for years. He has CAD with NSTEMI June 2013 with complex PCI of the Circumflex in June 2013 with 2.5 mm Resolute DES in the proximal Circumflex. His cath showed heavily calcified RCA and LAD with moderate LAD stenosis.  He also has severe carotid artery disease with 100% occlusion of the RICA and high grade disease in the LICA. He has refused to have treatment of this disease. He was seen by Dr. Myra Gianotti in [facility] in July 2013 and discussed treatment options but refused any treatment. He did not tolerate Lisinopril due to cough.  He is feeling well. No chest pain. No SOB. Tolerating all meds.  LE edema has worsened over the last few months. He has gained several pounds.    Primary Care Physician: Georgianne Fick, MD  Past Medical History:  Diagnosis Date  . Anemia    a. Felt to be iatrogenic from procedures/sticks 06/2011  . Arthritis   . CAD (coronary artery disease)    a. NSTEMI s/p complex PCI to prox Cx 07/03/11  . Carotid arterial disease (HCC)    a. By MRI 05/2011;  b. 06/2011 Carotid U/S: LICA 80-99%, RICA 100%, patent vertebrals.  . Diabetes mellitus   . Encephalopathy    AMS during 06/2011 hospitalization - MRI of brain without infarct but did show R carotid dz, abnl EEG with nonspecific encephalopathy, felt secondary to drugs  . Hypertension   . Myocardial infarction   . Transient atrial fibrillation or flutter    Brief WCT during hospitalization 06/2011 felt to be afib with abberation, not a good coumadin candidate    Past Surgical History:  Procedure Laterality Date  . APPENDECTOMY  1948  . LEFT HEART CATHETERIZATION WITH CORONARY ANGIOGRAM N/A 07/01/2011   Procedure: LEFT HEART CATHETERIZATION WITH CORONARY ANGIOGRAM;  Surgeon: Herby Abraham, MD;  Location: [facility] CATH LAB;  Service: Cardiovascular;  Laterality: N/A;  . PERCUTANEOUS CORONARY STENT INTERVENTION (PCI-S) N/A 07/03/2011   Procedure: PERCUTANEOUS CORONARY STENT INTERVENTION (PCI-S);  Surgeon: Herby Abraham, MD;  Location: [facility] CATH LAB;  Service: Cardiovascular;  Laterality: N/A;  . thumb surgery  1975   left    Current Outpatient Prescriptions  Medication Sig Dispense Refill  . acetaminophen (TYLENOL) 325 MG tablet Take 650 mg by mouth every 6 (six) hours as needed (pain).     Marland Kitchen aspirin 81 MG tablet Take 1 tablet (81 mg total) by mouth daily.    . carvedilol (COREG) 12.5 MG tablet Take 6.25 mg by mouth 2 (two) times daily with a meal.    . clopidogrel (PLAVIX) 75 MG tablet Take 1 tablet (75 mg total) by mouth daily. 30 tablet 11  . fish oil-omega-3 fatty acids 1000 MG capsule Take 1 g by mouth 3 (three) times daily.     . insulin detemir (LEVEMIR) 100 UNIT/ML injection Inject 36-40 Units into the skin 2 (two) times daily. 38 units with breakfast and 12 units with dinner     . ketoconazole (NIZORAL) 2 % cream Apply 1 application topically as needed. For rash    . levothyroxine (SYNTHROID, LEVOTHROID) 50 MCG tablet Take 1 tablet (50 mcg total) by mouth daily. 30 tablet 3  . pantoprazole (PROTONIX) 40 MG tablet Take 40 mg by mouth  daily after lunch.     . potassium chloride (K-DUR) 10 MEQ tablet Take 10 mEq by mouth daily.   0  . simvastatin (ZOCOR) 40 MG tablet Take 20 mg by mouth daily. Pt takes 1/2 tablet at bedtime     . Specialty Vitamins Products (PROSTATE PO) Take 600 mg by mouth daily. Super beta prostate, 600 mg     . furosemide (LASIX) 40 MG tablet Take 1 tablet (40 mg total) by mouth 2 (two) times daily. 60 tablet 6  . isosorbide mononitrate (IMDUR) 30 MG 24 hr tablet Take 30 mg by mouth daily after lunch.    . nitroGLYCERIN (NITROSTAT) 0.4 MG SL tablet Place 1 tablet (0.4 mg total) under the tongue every 5 (five) minutes x 3 doses as needed for chest  pain. 25 tablet 4   No current facility-administered medications for this visit.     Allergies  Allergen Reactions  . Ativan [Lorazepam] Other (See Comments)    agitation    Social History   Social History  . Marital status: Widowed    Spouse name: N/A  . Number of children: N/A  . Years of education: N/A   Occupational History  . Not on file.   Social History Main Topics  . Smoking status: Former Smoker    Types: Cigarettes    Quit date: 01/13/1986  . Smokeless tobacco: Never Used  . Alcohol use No  . Drug use: No  . Sexual activity: Not on file   Other Topics Concern  . Not on file   Social History Narrative  . No narrative on file    Family History  Problem Relation Age of Onset  . Diabetes Mother   . Cancer Father   . Cancer Sister   . Cancer Brother   . Cancer Daughter     Review of Systems:  As stated in the HPI and otherwise negative.   BP (!) 130/54   Pulse 74   Ht 5\' 5"  (1.651 m)   Wt 171 lb 6.4 oz (77.7 kg)   BMI 28.52 kg/m   Physical Examination: General: Well developed, well nourished, NAD  HEENT: OP clear, mucus membranes moist  SKIN: warm, dry. No rashes. Neuro: No focal deficits  Musculoskeletal: Muscle strength 5/5 all ext  Psychiatric: Mood and affect normal  Neck: No JVD, no carotid bruits, no thyromegaly, no lymphadenopathy.  Lungs:Clear bilaterally, no wheezes, rhonci, crackles Cardiovascular: Regular rate and rhythm. Soft systolic murmur noted. No gallops or rubs. Abdomen:Soft. Bowel sounds present. Non-tender.  Extremities: 2+ bilateral  lower extremity edema. Pulses are 2 + in the bilateral DP/PT.   Cardiac cath 07/01/11: Left mainstem: The vessel is heavily calcified, but without obstruction.  Left anterior descending (LAD): The vessel is also heavily calcified. There is moderate bifurcation plaque at the takeoff of the second diagonal. More distally, there is a 60% eccentric area of narrowing that does not appear flow  limiting. The large D2 also has some ostial narrowing that does not appear critical.  Left circumflex (LCx): The CFX has a short common trunk then divides to a large OM1 and AV circumflex. Just after the OM there is a calcified high grade stenosis leading to a large OM vessel. There is some plaquing of 50% in the mid OM1 and the 95 % lesion in the AV circ is calcified, and abuts the OM1. The distal AV circ is quite large.  Right coronary artery (RCA): The vessel is calcified and supplies a large  PD and PL branch. There is calcification throughout without critical narrowing. The PDA has some mild ostial narrowing.  Left ventriculography: Left ventricular systolic function is normal, LVEF is estimated at 55-65%, there is no significant mitral regurgitation   Echo June 2013: Left ventricle: The cavity size was normal. Wall thickness was increased in a pattern of moderate to severe LVH. Systolic function was vigorous. The estimated ejection fraction was in the range of 65% to 70%. Wall motion was normal; there were no regional wall motion abnormalities. Doppler parameters are consistent with abnormal left ventricular relaxation (grade 1 diastolic dysfunction). - Aortic valve: Cusp separation was mildly reduced. Trivial regurgitation. - Mitral valve: Mild regurgitation. - Pulmonary arteries: Systolic pressure was mildly increased. PA peak pressure: 32mm Hg (S).  EKG:  EKG is ordered today. The ekg ordered today demonstrates NSR, rate 74 bpm. 1st degree AV block. RBBB. LAFB. LVH  Recent Labs: No results found for requested labs within last 8760 hours.   Lipid Panel    Component Value Date/Time   CHOL 123 10/20/2011 1112   TRIG 62.0 10/20/2011 1112   HDL 50.70 10/20/2011 1112   CHOLHDL 2 10/20/2011 1112   VLDL 12.4 10/20/2011 1112   LDLCALC 60 10/20/2011 1112     Wt Readings from Last 3 Encounters:  03/27/16 171 lb 6.4 oz (77.7 kg)  06/22/14 167 lb 6.4 oz (75.9 kg)  11/21/13 164 lb  (74.4 kg)     Other studies Reviewed: Additional studies/ records that were reviewed today include: . Review of the above records demonstrates:    Assessment and Plan:   1. CAD without angina:  He has no chest pain suggestive of angina. Will continue ASA/Plavix. He will refill at the [place]. He does not wish to have any further cardiac testing.   2. Carotid artery disease: Severe disease in LICA with occluded RICA. Has been seen in the vascular surgery office but refuses treatment. He is on ASA and Plavix. No desire for aggressive treatment.   3. HTN: BP controlled today.  No changes.   4. Acute on Chronic diastolic CHF: Weight is elevated. He has worsened LE edema. Will increase Lasix to 40 mg po BID. Will check BMET today. Return in 10 days with office APP and BMET.   5. Cardiac murmur: Systolic murmur. No worrisome valve disease on echo in 2013. He does not wish to have further cardiac testing. Will not repeat echo.    Current medicines are reviewed at length with the patient today.  The patient does not have concerns regarding medicines.  The following changes have been made:  no change  Labs/ tests ordered today include:   Orders Placed This Encounter  Procedures  . Basic Metabolic Panel (BMET)  . EKG 12-Lead    Disposition:   F/U office APP in 10 days with BMET. FU with me in 12  months  Signed, Verne Carrow, MD 03/27/2016 1:46 PM    [facility] [address] [place], [place], [place]  [address] Phone: [phone]; Fax: [phone]

## 2016-03-27 NOTE — Patient Instructions (Signed)
Medication Instructions:  Your physician has recommended you make the following change in your medication:  Increase furosemide to 40 mg by mouth daily   Labwork: Lab work to be done today--BMP  Testing/Procedures: none  Follow-Up: Your physician recommends that you schedule a follow-up appointment in: 10-14 days with PA or NP    Any Other Special Instructions Will Be Listed Below (If Applicable).     If you need a refill on your cardiac medications before your next appointment, please call your pharmacy.

## 2016-03-28 LAB — BASIC METABOLIC PANEL
BUN / CREAT RATIO: 17 (ref 10–24)
BUN: 26 mg/dL (ref 10–36)
CALCIUM: 8.8 mg/dL (ref 8.6–10.2)
CHLORIDE: 96 mmol/L (ref 96–106)
CO2: 20 mmol/L (ref 18–29)
Creatinine, Ser: 1.49 mg/dL — ABNORMAL HIGH (ref 0.76–1.27)
GFR calc Af Amer: 46 mL/min/{1.73_m2} — ABNORMAL LOW (ref >59)
GFR calc non Af Amer: 40 mL/min/{1.73_m2} — ABNORMAL LOW (ref >59)
GLUCOSE: 447 mg/dL — AB (ref 65–99)
Potassium: 4.5 mmol/L (ref 3.5–5.2)
Sodium: 134 mmol/L (ref 134–144)

## 2016-04-02 DIAGNOSIS — Z794 Long term (current) use of insulin: Secondary | ICD-10-CM | POA: Diagnosis not present

## 2016-04-02 DIAGNOSIS — Z9641 Presence of insulin pump (external) (internal): Secondary | ICD-10-CM | POA: Diagnosis not present

## 2016-04-02 DIAGNOSIS — E109 Type 1 diabetes mellitus without complications: Secondary | ICD-10-CM | POA: Diagnosis not present

## 2016-04-03 ENCOUNTER — Ambulatory Visit: Payer: Medicare Other | Admitting: Physician Assistant

## 2016-04-11 ENCOUNTER — Ambulatory Visit: Payer: Medicare Other | Admitting: Cardiology

## 2016-04-23 DIAGNOSIS — I2510 Atherosclerotic heart disease of native coronary artery without angina pectoris: Secondary | ICD-10-CM | POA: Diagnosis not present

## 2016-04-23 DIAGNOSIS — E1121 Type 2 diabetes mellitus with diabetic nephropathy: Secondary | ICD-10-CM | POA: Diagnosis not present

## 2016-04-23 DIAGNOSIS — M150 Primary generalized (osteo)arthritis: Secondary | ICD-10-CM | POA: Diagnosis not present

## 2016-04-23 DIAGNOSIS — E782 Mixed hyperlipidemia: Secondary | ICD-10-CM | POA: Diagnosis not present

## 2016-05-06 ENCOUNTER — Ambulatory Visit: Payer: Medicare Other | Admitting: Physician Assistant

## 2016-05-26 DIAGNOSIS — E109 Type 1 diabetes mellitus without complications: Secondary | ICD-10-CM | POA: Diagnosis not present

## 2016-09-24 DIAGNOSIS — Z9641 Presence of insulin pump (external) (internal): Secondary | ICD-10-CM | POA: Diagnosis not present

## 2016-09-24 DIAGNOSIS — Z794 Long term (current) use of insulin: Secondary | ICD-10-CM | POA: Diagnosis not present

## 2016-09-24 DIAGNOSIS — E109 Type 1 diabetes mellitus without complications: Secondary | ICD-10-CM | POA: Diagnosis not present

## 2016-10-10 ENCOUNTER — Ambulatory Visit: Payer: Medicare Other | Admitting: Cardiovascular Disease

## 2016-10-10 NOTE — Progress Notes (Deleted)
No chief complaint on file.   History of Present Illness: 83 yo male with history of DM, HTN, CAD, carotid artery stenosis here today for cardiac follow up. He had been followed by Dr. Riley Robinson for many years. He has CAD with NSTEMI June 2013 with complex PCI of the Circumflex in June 2013 with placement of a drug eluting stent in the proximal Circumflex. His cath showed heavily calcified RCA and LAD with moderate LAD stenosis.  He also has severe carotid artery disease with 100% occlusion of the RICA and high grade disease in the LICA. He has refused to have treatment of this disease. He was seen by Dr. Myra Robinson in [facility] in July 2013 and discussed treatment options but refused any treatment. He did not tolerate Lisinopril due to cough. He has done well in regards to his CAD with medical management. I saw him in March 2018 and he had increased LE edema and weight gain. Lasix was increased.   He is here today for follow up. The patient denies any chest pain, dyspnea, palpitations, lower extremity edema, orthopnea, PND, dizziness, near syncope or syncope.   Primary Care Physician: Levi Fick, MD  Past Medical History:  Diagnosis Date  . Anemia    a. Felt to be iatrogenic from procedures/sticks 06/2011  . Arthritis   . CAD (coronary artery disease)    a. NSTEMI s/p complex PCI to prox Cx 07/03/11  . Carotid arterial disease (HCC)    a. By MRI 05/2011;  b. 06/2011 Carotid U/S: LICA 80-99%, RICA 100%, patent vertebrals.  . Diabetes mellitus   . Encephalopathy    AMS during 06/2011 hospitalization - MRI of brain without infarct but did show R carotid dz, abnl EEG with nonspecific encephalopathy, felt secondary to drugs  . Hypertension   . Myocardial infarction   . Transient atrial fibrillation or flutter    Brief WCT during hospitalization 06/2011 felt to be afib with abberation, not a good coumadin candidate    Past Surgical History:  Procedure Laterality Date  . APPENDECTOMY  1948  .  LEFT HEART CATHETERIZATION WITH CORONARY ANGIOGRAM N/A 07/01/2011   Procedure: LEFT HEART CATHETERIZATION WITH CORONARY ANGIOGRAM;  Surgeon: Levi Abraham, MD;  Location: [facility] CATH LAB;  Service: Cardiovascular;  Laterality: N/A;  . PERCUTANEOUS CORONARY STENT INTERVENTION (PCI-S) N/A 07/03/2011   Procedure: PERCUTANEOUS CORONARY STENT INTERVENTION (PCI-S);  Surgeon: Levi Abraham, MD;  Location: [facility] CATH LAB;  Service: Cardiovascular;  Laterality: N/A;  . thumb surgery  1975   left    Current Outpatient Prescriptions  Medication Sig Dispense Refill  . acetaminophen (TYLENOL) 325 MG tablet Take 650 mg by mouth every 6 (six) hours as needed (pain).     Marland Kitchen aspirin 81 MG tablet Take 1 tablet (81 mg total) by mouth daily.    . carvedilol (COREG) 12.5 MG tablet Take 6.25 mg by mouth 2 (two) times daily with a meal.    . clopidogrel (PLAVIX) 75 MG tablet Take 1 tablet (75 mg total) by mouth daily. 30 tablet 11  . fish oil-omega-3 fatty acids 1000 MG capsule Take 1 g by mouth 3 (three) times daily.     . furosemide (LASIX) 40 MG tablet Take 1 tablet (40 mg total) by mouth 2 (two) times daily. 60 tablet 6  . insulin detemir (LEVEMIR) 100 UNIT/ML injection Inject 36-40 Units into the skin 2 (two) times daily. 38 units with breakfast and 12 units with dinner     . isosorbide  mononitrate (IMDUR) 30 MG 24 hr tablet Take 30 mg by mouth daily after lunch.    . ketoconazole (NIZORAL) 2 % cream Apply 1 application topically as needed. For rash    . levothyroxine (SYNTHROID, LEVOTHROID) 50 MCG tablet Take 1 tablet (50 mcg total) by mouth daily. 30 tablet 3  . nitroGLYCERIN (NITROSTAT) 0.4 MG SL tablet Place 1 tablet (0.4 mg total) under the tongue every 5 (five) minutes x 3 doses as needed for chest pain. 25 tablet 4  . pantoprazole (PROTONIX) 40 MG tablet Take 40 mg by mouth daily after lunch.     . potassium chloride (K-DUR) 10 MEQ tablet Take 10 mEq by mouth daily.   0  . simvastatin (ZOCOR) 40 MG tablet  Take 20 mg by mouth daily. Pt takes 1/2 tablet at bedtime     . Specialty Vitamins Products (PROSTATE PO) Take 600 mg by mouth daily. Super beta prostate, 600 mg      No current facility-administered medications for this visit.     Allergies  Allergen Reactions  . Ativan [Lorazepam] Other (See Comments)    agitation    Social History   Social History  . Marital status: Widowed    Spouse name: N/A  . Number of children: N/A  . Years of education: N/A   Occupational History  . Not on file.   Social History Main Topics  . Smoking status: Former Smoker    Types: Cigarettes    Quit date: 01/13/1986  . Smokeless tobacco: Never Used  . Alcohol use No  . Drug use: No  . Sexual activity: Not on file   Other Topics Concern  . Not on file   Social History Narrative  . No narrative on file    Family History  Problem Relation Age of Onset  . Diabetes Mother   . Cancer Father   . Cancer Sister   . Cancer Brother   . Cancer Daughter     Review of Systems:  As stated in the HPI and otherwise negative.   There were no vitals taken for this visit.  Physical Examination:  General: Well developed, well nourished, NAD  HEENT: OP clear, mucus membranes moist  SKIN: warm, dry. No rashes. Neuro: No focal deficits  Musculoskeletal: Muscle strength 5/5 all ext  Psychiatric: Mood and affect normal  Neck: No JVD, no carotid bruits, no thyromegaly, no lymphadenopathy.  Lungs:Clear bilaterally, no wheezes, rhonci, crackles Cardiovascular: Regular rate and rhythm. No murmurs, gallops or rubs. Abdomen:Soft. Bowel sounds present. Non-tender.  Extremities: No lower extremity edema. Pulses are 2 + in the bilateral DP/PT.  Cardiac cath 07/01/11: Left mainstem: The vessel is heavily calcified, but without obstruction.  Left anterior descending (LAD): The vessel is also heavily calcified. There is moderate bifurcation plaque at the takeoff of the second diagonal. More distally, there is a  60% eccentric area of narrowing that does not appear flow limiting. The large D2 also has some ostial narrowing that does not appear critical.  Left circumflex (LCx): The CFX has a short common trunk then divides to a large OM1 and AV circumflex. Just after the OM there is a calcified high grade stenosis leading to a large OM vessel. There is some plaquing of 50% in the mid OM1 and the 95 % lesion in the AV circ is calcified, and abuts the OM1. The distal AV circ is quite large.  Right coronary artery (RCA): The vessel is calcified and supplies a large PD and  PL branch. There is calcification throughout without critical narrowing. The PDA has some mild ostial narrowing.  Left ventriculography: Left ventricular systolic function is normal, LVEF is estimated at 55-65%, there is no significant mitral regurgitation   Echo June 2013: Left ventricle: The cavity size was normal. Wall thickness was increased in a pattern of moderate to severe LVH. Systolic function was vigorous. The estimated ejection fraction was in the range of 65% to 70%. Wall motion was normal; there were no regional wall motion abnormalities. Doppler parameters are consistent with abnormal left ventricular relaxation (grade 1 diastolic dysfunction). - Aortic valve: Cusp separation was mildly reduced. Trivial regurgitation. - Mitral valve: Mild regurgitation. - Pulmonary arteries: Systolic pressure was mildly increased. PA peak pressure: 32mm Hg (S).  EKG:  EKG is *** ordered today. The ekg ordered today demonstrates  Recent Labs: 03/27/2016: BUN 26; Creatinine, Ser 1.49; Potassium 4.5; Sodium 134   Lipid Panel    Component Value Date/Time   CHOL 123 10/20/2011 1112   TRIG 62.0 10/20/2011 1112   HDL 50.70 10/20/2011 1112   CHOLHDL 2 10/20/2011 1112   VLDL 12.4 10/20/2011 1112   LDLCALC 60 10/20/2011 1112     Wt Readings from Last 3 Encounters:  03/27/16 171 lb 6.4 oz (77.7 kg)  06/22/14 167 lb 6.4 oz (75.9 kg)    11/21/13 164 lb (74.4 kg)     Other studies Reviewed: Additional studies/ records that were reviewed today include: . Review of the above records demonstrates:    Assessment and Plan:   1. CAD without angina:  No chest pain. Will continue medical management of CAD with ASA/Plavix/statin/beta blocker and Imdur. He does not wish to have any further cardiac testing.   2. Carotid artery disease: Severe disease in LICA with occluded RICA. Has been seen in the vascular surgery office but refuses treatment. He is on ASA and Plavix. No desire for aggressive treatment.   3. HTN: BP controlled. NO changes.   4. Acute on Chronic diastolic CHF: Volume status is improved on Lasix ***  5. Cardiac murmur: Systolic murmur. No worrisome valve disease on echo in 2013. He does not wish to have further cardiac testing. Will not repeat echo.    Current medicines are reviewed at length with the patient today.  The patient does not have concerns regarding medicines.  The following changes have been made:  no change  Labs/ tests ordered today include:   No orders of the defined types were placed in this encounter.   Disposition:   Follow up with me in ***  months  Signed, Verne Carrow, MD 10/10/2016 6:36 AM    [facility] [address] [place], [place], [place]  [address] Phone: [phone]; Fax: [phone]

## 2016-12-15 ENCOUNTER — Telehealth: Payer: Self-pay | Admitting: *Deleted

## 2016-12-15 ENCOUNTER — Ambulatory Visit: Payer: Medicare Other | Admitting: Physician Assistant

## 2016-12-15 ENCOUNTER — Telehealth: Payer: Self-pay | Admitting: Cardiovascular Disease

## 2016-12-15 NOTE — Telephone Encounter (Signed)
Patient daughter calling,states that she is returning call.

## 2016-12-15 NOTE — Telephone Encounter (Signed)
Tried to reach pt re: appt today, 12/15/16, with Ronie Spiesayna Dunn, PA-C. Pt needs to be seen by ED, PCP, or Neurologist, that her symptoms are sign of stroke, not cardiac related, per Ronie Spiesayna Dunn.   Voicemail was full.  Will try again later.

## 2016-12-15 NOTE — Telephone Encounter (Signed)
Spoke with granddaughter, Calton D[place]herea, [place].  She has been made aware that the pts symptoms, right sided weakness and falling could be possible signs of stroke, not cardiac, and that pt needed to be evaluated at the ED or his PCP. Granddaughter agreed, appt cancelled with Ronie Spiesayna Dunn, PA-C today.

## 2016-12-15 NOTE — Telephone Encounter (Signed)
Pts daughter, Archie Pattenonya, [place], returned my call.  She is agreeable to the plan that the pt needed an work up by pcp re: pts right sided weakness and falling.  She thanked me for the return call.

## 2016-12-19 ENCOUNTER — Other Ambulatory Visit: Payer: Self-pay

## 2016-12-19 ENCOUNTER — Emergency Department (HOSPITAL_BASED_OUTPATIENT_CLINIC_OR_DEPARTMENT_OTHER)
Admission: EM | Admit: 2016-12-19 | Discharge: 2016-12-19 | Disposition: A | Discharge status: Home / Self Care | Payer: Medicare Other | Attending: Emergency Medicine | Admitting: Emergency Medicine

## 2016-12-19 ENCOUNTER — Emergency Department (HOSPITAL_BASED_OUTPATIENT_CLINIC_OR_DEPARTMENT_OTHER): Payer: Medicare Other

## 2016-12-19 ENCOUNTER — Encounter (HOSPITAL_BASED_OUTPATIENT_CLINIC_OR_DEPARTMENT_OTHER): Payer: Self-pay

## 2016-12-19 DIAGNOSIS — Y939 Activity, unspecified: Secondary | ICD-10-CM | POA: Diagnosis not present

## 2016-12-19 DIAGNOSIS — W19XXXA Unspecified fall, initial encounter: Secondary | ICD-10-CM

## 2016-12-19 DIAGNOSIS — R269 Unspecified abnormalities of gait and mobility: Secondary | ICD-10-CM | POA: Insufficient documentation

## 2016-12-19 DIAGNOSIS — Z87891 Personal history of nicotine dependence: Secondary | ICD-10-CM | POA: Diagnosis not present

## 2016-12-19 DIAGNOSIS — Y999 Unspecified external cause status: Secondary | ICD-10-CM | POA: Diagnosis not present

## 2016-12-19 DIAGNOSIS — I10 Essential (primary) hypertension: Secondary | ICD-10-CM | POA: Diagnosis not present

## 2016-12-19 DIAGNOSIS — Z79899 Other long term (current) drug therapy: Secondary | ICD-10-CM | POA: Diagnosis not present

## 2016-12-19 DIAGNOSIS — Z7982 Long term (current) use of aspirin: Secondary | ICD-10-CM | POA: Diagnosis not present

## 2016-12-19 DIAGNOSIS — I2510 Atherosclerotic heart disease of native coronary artery without angina pectoris: Secondary | ICD-10-CM | POA: Diagnosis not present

## 2016-12-19 DIAGNOSIS — Z7902 Long term (current) use of antithrombotics/antiplatelets: Secondary | ICD-10-CM | POA: Diagnosis not present

## 2016-12-19 DIAGNOSIS — R5383 Other fatigue: Secondary | ICD-10-CM | POA: Insufficient documentation

## 2016-12-19 DIAGNOSIS — Y929 Unspecified place or not applicable: Secondary | ICD-10-CM | POA: Insufficient documentation

## 2016-12-19 DIAGNOSIS — E119 Type 2 diabetes mellitus without complications: Secondary | ICD-10-CM | POA: Insufficient documentation

## 2016-12-19 DIAGNOSIS — S79911A Unspecified injury of right hip, initial encounter: Secondary | ICD-10-CM | POA: Diagnosis not present

## 2016-12-19 DIAGNOSIS — W1839XA Other fall on same level, initial encounter: Secondary | ICD-10-CM | POA: Insufficient documentation

## 2016-12-19 DIAGNOSIS — S299XXA Unspecified injury of thorax, initial encounter: Secondary | ICD-10-CM | POA: Diagnosis not present

## 2016-12-19 DIAGNOSIS — R296 Repeated falls: Secondary | ICD-10-CM | POA: Diagnosis not present

## 2016-12-19 DIAGNOSIS — R262 Difficulty in walking, not elsewhere classified: Secondary | ICD-10-CM | POA: Diagnosis not present

## 2016-12-19 DIAGNOSIS — I252 Old myocardial infarction: Secondary | ICD-10-CM | POA: Insufficient documentation

## 2016-12-19 LAB — COMPREHENSIVE METABOLIC PANEL
ALT: 14 U/L — AB (ref 17–63)
AST: 22 U/L (ref 15–41)
Albumin: 3.7 g/dL (ref 3.5–5.0)
Alkaline Phosphatase: 67 U/L (ref 38–126)
Anion gap: 9 (ref 5–15)
BUN: 31 mg/dL — AB (ref 6–20)
CO2: 25 mmol/L (ref 22–32)
CREATININE: 1.53 mg/dL — AB (ref 0.61–1.24)
Calcium: 8.9 mg/dL (ref 8.9–10.3)
Chloride: 99 mmol/L — ABNORMAL LOW (ref 101–111)
GFR calc Af Amer: 44 mL/min — ABNORMAL LOW (ref >60)
GFR calc non Af Amer: 38 mL/min — ABNORMAL LOW (ref >60)
Glucose, Bld: 241 mg/dL — ABNORMAL HIGH (ref 65–99)
Potassium: 3.6 mmol/L (ref 3.5–5.1)
Sodium: 133 mmol/L — ABNORMAL LOW (ref 135–145)
TOTAL PROTEIN: 7.8 g/dL (ref 6.5–8.1)
Total Bilirubin: 0.8 mg/dL (ref 0.3–1.2)

## 2016-12-19 LAB — PROTIME-INR
INR: 1.05
Prothrombin Time: 13.6 seconds (ref 11.4–15.2)

## 2016-12-19 LAB — DIFFERENTIAL
BASOS ABS: 0 10*3/uL (ref 0.0–0.1)
Basophils Relative: 0 %
Eosinophils Absolute: 0.1 10*3/uL (ref 0.0–0.7)
Eosinophils Relative: 2 %
LYMPHS ABS: 1.4 10*3/uL (ref 0.7–4.0)
Lymphocytes Relative: 25 %
MONO ABS: 0.4 10*3/uL (ref 0.1–1.0)
Monocytes Relative: 7 %
NEUTROS ABS: 3.8 10*3/uL (ref 1.7–7.7)
Neutrophils Relative %: 66 %

## 2016-12-19 LAB — CBC
HCT: 39.7 % (ref 39.0–52.0)
HEMOGLOBIN: 13.6 g/dL (ref 13.0–17.0)
MCH: 31.6 pg (ref 26.0–34.0)
MCHC: 34.3 g/dL (ref 30.0–36.0)
MCV: 92.1 fL (ref 78.0–100.0)
Platelets: 154 10*3/uL (ref 150–400)
RBC: 4.31 MIL/uL (ref 4.22–5.81)
RDW: 13.1 % (ref 11.5–15.5)
WBC: 5.7 10*3/uL (ref 4.0–10.5)

## 2016-12-19 LAB — TROPONIN I
TROPONIN I: 0.06 ng/mL — AB (ref <0.03)
TROPONIN I: 0.05 ng/mL — AB (ref <0.03)

## 2016-12-19 LAB — APTT: APTT: 37 s — AB (ref 24–36)

## 2016-12-19 NOTE — ED Notes (Signed)
ED Provider at bedside. Pt on cardiac monitor and auto VS 

## 2016-12-19 NOTE — Discharge Instructions (Signed)
Increase your Lasix (furosemide) dose to twice per day, every day.  Please follow-up with a cardiologist in 3-5 days.

## 2016-12-19 NOTE — ED Triage Notes (Signed)
Per granddaughter/ caregiver pt fell twice last week-pt has been dragging right leg x 8 days since fall-pt presents to triage in w/c-pt denies pain-NAD

## 2016-12-19 NOTE — ED Notes (Signed)
ED Provider at bedside. 

## 2016-12-19 NOTE — ED Provider Notes (Signed)
Blood pressure 139/66, pulse 70, temperature 98.2 F (36.8 C), temperature source Oral, resp. rate 19, height 5\' 6"  (1.676 m), weight 81.6 kg (180 lb), SpO2 98 %.  Assuming care from Dr. Eudelia Bunchardama.  In short, Levi Robinson is a 83 y.o. male with a chief complaint of Fall .  Refer to the original H&P for additional details.  The current plan of care is to follow repeat troponin. If level is rising will admit to the hospitalist service. If the same or lower will discharge.    EKG Interpretation  Date/Time:  Friday December 19 2016 12:55:56 EST Ventricular Rate:  77 PR Interval:    QRS Duration: 135 QT Interval:  419 QTC Calculation: 475 R Axis:   -82 Text Interpretation:  Sinus rhythm Prolonged PR interval RBBB and LAFB No significant change since last tracing Confirmed by Drema Pryardama, Pedro [phone]) on 12/19/2016 1:44:30 PM      04:50 PM Repeat troponin 0.05. Patient with no symptoms. Plan for Cardiology f/u next week. Discussed plan in detail with the patient and daughter.   At this time, I do not feel there is any life-threatening condition present. I have reviewed and discussed all results (EKG, imaging, lab, urine as appropriate), exam findings with patient. I have reviewed nursing notes and appropriate previous records.  I feel the patient is safe to be discharged home without further emergent workup. Discussed usual and customary return precautions. Patient and family (if present) verbalize understanding and are comfortable with this plan.  Patient will follow-up with their primary care provider. If they do not have a primary care provider, information for follow-up has been provided to them. All questions have been answered.  Alona BeneJoshua Elzie Sheets, MD    Maia PlanLong, Levi Rodeheaver G, MD 12/19/16 [phone]

## 2016-12-19 NOTE — ED Provider Notes (Signed)
[facility] EMERGENCY DEPARTMENT Provider Note  CSN: [id number] Arrival date & time: 12/19/16 1155  Chief Complaint(s) Fall  HPI Burr MedicoBernice L Robinson is a 83 y.o. male with a history of hypertension, diabetes, CAD status post NSTEMI, diastolic heart heart failure here for apparent right leg weakness following a mechanical fall 8 days ago.  Daughter reports that the patient lost his balance and fell, since then he has been having difficulty with his right lower extremity.  She states that at times it appears like he is dragging it or having difficulty lifting his foot to ambulate.  She did report that this has been improving, though. At baseline the patient ambulates with a cane or a walker.  The patient is denying any pain related to the fall.  However he is endorsing lightheadedness.  He also reports that he has been having several weeks of exertional fatigue and increased lower extremity edema.  Patient is on Lasix and reports that his dose was increased several months ago.  Currently he denies any chest pain, shortness of breath, headache, lightheadedness.  No aggravating or alleviating factors.  He denies any other physical complaints.  HPI  Past Medical History Past Medical History:  Diagnosis Date  . Anemia    a. Felt to be iatrogenic from procedures/sticks 06/2011  . Arthritis   . CAD (coronary artery disease)    a. NSTEMI s/p complex PCI to prox Cx 07/03/11  . Carotid arterial disease (HCC)    a. By MRI 05/2011;  b. 06/2011 Carotid U/S: LICA 80-99%, RICA 100%, patent vertebrals.  . Diabetes mellitus   . Encephalopathy    AMS during 06/2011 hospitalization - MRI of brain without infarct but did show R carotid dz, abnl EEG with nonspecific encephalopathy, felt secondary to drugs  . Hypertension   . Myocardial infarction (HCC)   . Transient atrial fibrillation or flutter    Brief WCT during hospitalization 06/2011 felt to be afib with abberation, not a good coumadin candidate    Patient Active Problem List   Diagnosis Date Noted  . Occlusion and stenosis of carotid artery without mention of cerebral infarction 08/04/2011  . Hypertension   . CAD (coronary artery disease)   . Carotid arterial disease (HCC)   . Transient atrial fibrillation or flutter   . Anemia    Home Medication(s) Prior to Admission medications   Medication Sig Start Date End Date Taking? Authorizing Provider  acetaminophen (TYLENOL) 325 MG tablet Take 650 mg by mouth every 6 (six) hours as needed (pain).     [provider]  aspirin 81 MG tablet Take 1 tablet (81 mg total) by mouth daily. 07/04/11   Dunn, Tacey Ruizayna N, PA-C  carvedilol (COREG) 12.5 MG tablet Take 6.25 mg by mouth 2 (two) times daily with a meal.    [provider]  clopidogrel (PLAVIX) 75 MG tablet Take 1 tablet (75 mg total) by mouth daily. 05/24/13   Kathleene HazelMcAlhany, Christopher D, MD  fish oil-omega-3 fatty acids 1000 MG capsule Take 1 g by mouth 3 (three) times daily.     [provider]  furosemide (LASIX) 40 MG tablet Take 1 tablet (40 mg total) by mouth 2 (two) times daily. 03/27/16 06/25/16  Kathleene HazelMcAlhany, Christopher D, MD  insulin detemir (LEVEMIR) 100 UNIT/ML injection Inject 36-40 Units into the skin 2 (two) times daily. 38 units with breakfast and 12 units with dinner  07/04/11   Dunn, Tacey Ruizayna N, PA-C  isosorbide mononitrate (IMDUR) 30 MG 24 hr  tablet Take 30 mg by mouth daily after lunch. 07/04/11 07/03/12  Dunn, Tacey Ruiz, PA-C  ketoconazole (NIZORAL) 2 % cream Apply 1 application topically as needed. For rash 07/28/11   [provider]  levothyroxine (SYNTHROID, LEVOTHROID) 50 MCG tablet Take 1 tablet (50 mcg total) by mouth daily. 06/22/14   Kathleene Hazel, MD  nitroGLYCERIN (NITROSTAT) 0.4 MG SL tablet Place 1 tablet (0.4 mg total) under the tongue every 5 (five) minutes x 3 doses as needed for chest pain. 07/04/11 07/03/12  Dunn, Tacey Ruiz, PA-C  pantoprazole (PROTONIX) 40 MG tablet Take 40 mg by  mouth daily after lunch.     [provider]  potassium chloride (K-DUR) 10 MEQ tablet Take 10 mEq by mouth daily.  11/07/13   [provider]  simvastatin (ZOCOR) 40 MG tablet Take 20 mg by mouth daily. Pt takes 1/2 tablet at bedtime     [provider]  Specialty Vitamins Products (PROSTATE PO) Take 600 mg by mouth daily. Super beta prostate, 600 mg     [provider]                                                                                                                                    Past Surgical History Past Surgical History:  Procedure Laterality Date  . APPENDECTOMY  1948  . LEFT HEART CATHETERIZATION WITH CORONARY ANGIOGRAM N/A 07/01/2011   Procedure: LEFT HEART CATHETERIZATION WITH CORONARY ANGIOGRAM;  Surgeon: Herby Abraham, MD;  Location: [facility] CATH LAB;  Service: Cardiovascular;  Laterality: N/A;  . PERCUTANEOUS CORONARY STENT INTERVENTION (PCI-S) N/A 07/03/2011   Procedure: PERCUTANEOUS CORONARY STENT INTERVENTION (PCI-S);  Surgeon: Herby Abraham, MD;  Location: [facility] CATH LAB;  Service: Cardiovascular;  Laterality: N/A;  . thumb surgery  1975   left   Family History Family History  Problem Relation Age of Onset  . Diabetes Mother   . Cancer Father   . Cancer Sister   . Cancer Brother   . Cancer Daughter     Social History Social History   Tobacco Use  . Smoking status: Former Smoker    Types: Cigarettes    Last attempt to quit: 01/13/1986    Years since quitting: 30.9  . Smokeless tobacco: Never Used  Substance Use Topics  . Alcohol use: No  . Drug use: No   Allergies Ativan [lorazepam]  Review of Systems Review of Systems All other systems are reviewed and are negative for acute change except as noted in the HPI  Physical Exam Vital Signs  I have reviewed the triage vital signs BP (!) 128/58 (BP Location: Right Arm)   Pulse 80   Temp 98.2 F (36.8 C) (Oral)   Resp 18   Ht 5\' 6"  (1.676 m)   Wt 81.6 kg  (180 lb)   SpO2 99%   BMI 29.05 kg/m   Physical Exam  Constitutional: He  is oriented to person, place, and time. He appears well-developed and well-nourished. No distress.  HENT:  Head: Normocephalic and atraumatic.  Nose: Nose normal.  Eyes: Conjunctivae and EOM are normal. Pupils are equal, round, and reactive to light. Right eye exhibits no discharge. Left eye exhibits no discharge. No scleral icterus.  Neck: Normal range of motion. Neck supple.  Cardiovascular: Normal rate and regular rhythm. Exam reveals no gallop and no friction rub.  No murmur heard. Pulmonary/Chest: Effort normal and breath sounds normal. No stridor. No respiratory distress. He has no rales.  Abdominal: Soft. He exhibits no distension. There is no tenderness.  Musculoskeletal: He exhibits no edema or tenderness.       Right hip: He exhibits decreased range of motion. He exhibits no tenderness, no bony tenderness and no deformity.  2+ bilateral lower extremity edema  Neurological: He is alert and oriented to person, place, and time.  Mental Status:  Alert and oriented to person, place, and time.  Attention and concentration normal.  Speech clear.  Recent memory is intact  Cranial Nerves:  II Visual Fields: Intact to confrontation. Visual fields intact. III, IV, VI: Pupils equal and reactive to light and near. Full eye movement without nystagmus  V Facial Sensation: Normal. No weakness of masticatory muscles  VII: No facial weakness or asymmetry  VIII Auditory Acuity: Grossly normal  IX/X: The uvula is midline; the palate elevates symmetrically  XI: Normal sternocleidomastoid and trapezius strength  XII: The tongue is midline. No atrophy or fasciculations.   Motor System: Muscle Strength: 5/5 and symmetric in the upper and lower extremities. No pronation or drift.  Muscle Tone: Tone and muscle bulk are normal in the upper and lower extremities.   Reflexes: DTRs: 1+ and symmetrical in all four extremities.  No Clonus Coordination: Intact finger-to-nose, heel-to-shin. No tremor.  Sensation: Intact to light touch, and pinprick.   Gait: Antalgic.  Patient does have difficulty lifting the right lower extremity.   Skin: Skin is warm and dry. No rash noted. He is not diaphoretic. No erythema.  Psychiatric: He has a normal mood and affect.  Vitals reviewed.   ED Results and Treatments Labs (all labs ordered are listed, but only abnormal results are displayed) Labs Reviewed  APTT - Abnormal; Notable for the following components:      Result Value   aPTT 37 (*)    All other components within normal limits  COMPREHENSIVE METABOLIC PANEL - Abnormal; Notable for the following components:   Sodium 133 (*)    Chloride 99 (*)    Glucose, Bld 241 (*)    BUN 31 (*)    Creatinine, Ser 1.53 (*)    ALT 14 (*)    GFR calc non Af Amer 38 (*)    GFR calc Af Amer 44 (*)    All other components within normal limits  TROPONIN I - Abnormal; Notable for the following components:   Troponin I 0.06 (*)    All other components within normal limits  PROTIME-INR  CBC  DIFFERENTIAL  TROPONIN I  EKG  EKG Interpretation  Date/Time:  Friday December 19 2016 12:55:56 EST Ventricular Rate:  77 PR Interval:    QRS Duration: 135 QT Interval:  419 QTC Calculation: 475 R Axis:   -82 Text Interpretation:  Sinus rhythm Prolonged PR interval RBBB and LAFB No significant change since last tracing Confirmed by Drema Pryardama, Pedro [id number]) on 12/19/2016 1:44:30 PM      Radiology Dg Chest 2 View  Result Date: 12/19/2016 CLINICAL DATA:  Status post fall x2 over the past week. The patient is dragging her right leg. EXAM: CHEST  2 VIEW COMPARISON:  PA and lateral chest 08/26/2011. FINDINGS: Bibasilar scarring is worse on the left and unchanged. No consolidative process, pneumothorax or effusion. Heart size is  normal. No acute bony abnormality. Aortic atherosclerosis noted. IMPRESSION: No acute disease. Atherosclerosis. Electronically Signed   By: Drusilla Kannerhomas  Dalessio M.D.   On: 12/19/2016 15:19   Ct Head Wo Contrast  Result Date: 12/19/2016 CLINICAL DATA:  Multiple falls. Right-sided weakness. Focal neuro deficit. Suspect stroke EXAM: CT HEAD WITHOUT CONTRAST TECHNIQUE: Contiguous axial images were obtained from the base of the skull through the vertex without intravenous contrast. COMPARISON:  CT 08/26/2011 FINDINGS: Brain: Advanced atrophy has progressed in the interval. Chronic white matter hypodensity also has progressed. Negative for acute infarct, hemorrhage, or mass lesion. Vascular: Negative for hyperdense vessel Skull: Negative Sinuses/Orbits: Negative Other: None IMPRESSION: No acute intracranial abnormality Progressive atrophy and chronic white matter changes since 2013. Electronically Signed   By: Marlan Palauharles  Clark M.D.   On: 12/19/2016 13:30   Dg Hip Unilat W Or Wo Pelvis 2-3 Views Right  Result Date: 12/19/2016 CLINICAL DATA:  Status post fall x2 last week. The patient is now dragging her right leg. Initial encounter. EXAM: DG HIP (WITH OR WITHOUT PELVIS) 2-3V RIGHT COMPARISON:  None. FINDINGS: There is no evidence of hip fracture or dislocation. There is no evidence of arthropathy or other focal bone abnormality. IMPRESSION: Negative exam. Electronically Signed   By: Drusilla Kannerhomas  Dalessio M.D.   On: 12/19/2016 15:18   Pertinent labs & imaging results that were available during my care of the patient were reviewed by me and considered in my medical decision making (see chart for details).  Medications Ordered in ED Medications - No data to display                                                                                                                                  Procedures Procedures  (including critical care time)  Medical Decision Making / ED Course I have reviewed the nursing notes  for this encounter and the patient's prior records (if available in EHR or on provided paperwork).    1.  Difficulty moving right lower extremity No focal weakness noted on exam however patient does have difficulty lifting his foot with ambulation.  Plain film of the hip without evidence of fracture or dislocation.  CT of  the head was obtained and did not reveal evidence of remote/subacute CVA.  Likely muscular.  2.  Fatigue on exertion History of diastolic heart failure, with 2+ bilateral lower extremity edema.  Troponin mildly elevated.  Patient does not have any chest pain or shortness of breath concerning for ACS.  This likely secondary for CHF exacerbation.  Plan to obtain a delta troponin to ensure no significant increase.  If this is stable, will increase patient's Lasix and should be appropriate to follow-up with cardiology.  We will discuss this plan with cardiology to see if they agree.  Discussed the plan with Dr. Anne Fu who agreed.  Patient care turned over to Dr Jacqulyn Bath at 1600. Patient case and results discussed in detail; please see their note for further ED managment.     This chart was dictated using voice recognition software.  Despite best efforts to proofread,  errors can occur which can change the documentation meaning.   Nira Conn, MD 12/19/16 [phone]

## 2017-01-27 DIAGNOSIS — E109 Type 1 diabetes mellitus without complications: Secondary | ICD-10-CM | POA: Diagnosis not present

## 2017-01-27 DIAGNOSIS — Z9641 Presence of insulin pump (external) (internal): Secondary | ICD-10-CM | POA: Diagnosis not present

## 2017-01-27 DIAGNOSIS — Z794 Long term (current) use of insulin: Secondary | ICD-10-CM | POA: Diagnosis not present

## 2017-02-12 ENCOUNTER — Encounter: Payer: Self-pay | Admitting: Cardiology

## 2017-02-12 ENCOUNTER — Encounter: Payer: Self-pay | Admitting: *Deleted

## 2017-02-12 ENCOUNTER — Ambulatory Visit (INDEPENDENT_AMBULATORY_CARE_PROVIDER_SITE_OTHER): Payer: Medicare Other | Admitting: Cardiology

## 2017-02-12 VITALS — BP 132/74 | HR 71 | Ht 66.0 in | Wt 157.0 lb

## 2017-02-12 DIAGNOSIS — I5032 Chronic diastolic (congestive) heart failure: Secondary | ICD-10-CM | POA: Diagnosis not present

## 2017-02-12 DIAGNOSIS — Z5181 Encounter for therapeutic drug level monitoring: Secondary | ICD-10-CM

## 2017-02-12 LAB — BASIC METABOLIC PANEL
BUN/Creatinine Ratio: 19 (ref 10–24)
BUN: 29 mg/dL (ref 10–36)
CO2: 22 mmol/L (ref 20–29)
Calcium: 8.8 mg/dL (ref 8.6–10.2)
Chloride: 96 mmol/L (ref 96–106)
Creatinine, Ser: 1.5 mg/dL — ABNORMAL HIGH (ref 0.76–1.27)
GFR, EST AFRICAN AMERICAN: 46 mL/min/{1.73_m2} — AB (ref >59)
GFR, EST NON AFRICAN AMERICAN: 40 mL/min/{1.73_m2} — AB (ref >59)
Glucose: 370 mg/dL — ABNORMAL HIGH (ref 65–99)
POTASSIUM: 4 mmol/L (ref 3.5–5.2)
SODIUM: 133 mmol/L — AB (ref 134–144)

## 2017-02-12 NOTE — Patient Instructions (Signed)
Medication Instructions:   Your physician recommends that you continue on your current medications as directed. Please refer to the Current Medication list given to you today.   If you need a refill on your cardiac medications before your next appointment, please call your pharmacy.  Labwork: BMET TODAY    Testing/Procedures: NONE ORDERED  TODAY    Follow-Up:  WITH DR Clifton James  IN [place]    Any Other Special Instructions Will Be Listed Below (If Applicable).    Low-Sodium Eating Plan Sodium, which is an element that makes up salt, helps you maintain a healthy balance of fluids in your body. Too much sodium can increase your blood pressure and cause fluid and waste to be held in your body. Your health care provider or dietitian may recommend following this plan if you have high blood pressure (hypertension), kidney disease, liver disease, or heart failure. Eating less sodium can help lower your blood pressure, reduce swelling, and protect your heart, liver, and kidneys. What are tips for following this plan? General guidelines  Most people on this plan should limit their sodium intake to 1,500-2,000 mg (milligrams) of sodium each day. Reading food labels  The Nutrition Facts label lists the amount of sodium in one serving of the food. If you eat more than one serving, you must multiply the listed amount of sodium by the number of servings.  Choose foods with less than 140 mg of sodium per serving.  Avoid foods with 300 mg of sodium or more per serving. Shopping  Look for lower-sodium products, often labeled as "low-sodium" or "no salt added."  Always check the sodium content even if foods are labeled as "unsalted" or "no salt added".  Buy fresh foods. ? Avoid canned foods and premade or frozen meals. ? Avoid canned, cured, or processed meats  Buy breads that have less than 80 mg of sodium per slice. Cooking  Eat more home-cooked food and less restaurant, buffet, and fast  food.  Avoid adding salt when cooking. Use salt-free seasonings or herbs instead of table salt or sea salt. Check with your health care provider or pharmacist before using salt substitutes.  Cook with plant-based oils, such as canola, sunflower, or olive oil. Meal planning  When eating at a restaurant, ask that your food be prepared with less salt or no salt, if possible.  Avoid foods that contain MSG (monosodium glutamate). MSG is sometimes added to [place] food, bouillon, and some canned foods. What foods are recommended? The items listed may not be a complete list. Talk with your dietitian about what dietary choices are best for you. Grains Low-sodium cereals, including oats, puffed wheat and rice, and shredded wheat. Low-sodium crackers. Unsalted rice. Unsalted pasta. Low-sodium bread. Whole-grain breads and whole-grain pasta. Vegetables Fresh or frozen vegetables. "No salt added" canned vegetables. "No salt added" tomato sauce and paste. Low-sodium or reduced-sodium tomato and vegetable juice. Fruits Fresh, frozen, or canned fruit. Fruit juice. Meats and other protein foods Fresh or frozen (no salt added) meat, poultry, seafood, and fish. Low-sodium canned tuna and salmon. Unsalted nuts. Dried peas, beans, and lentils without added salt. Unsalted canned beans. Eggs. Unsalted nut butters. Dairy Milk. Soy milk. Cheese that is naturally low in sodium, such as ricotta cheese, fresh mozzarella, or Swiss cheese Low-sodium or reduced-sodium cheese. Cream cheese. Yogurt. Fats and oils Unsalted butter. Unsalted margarine with no trans fat. Vegetable oils such as canola or olive oils. Seasonings and other foods Fresh and dried herbs and spices. Salt-free  seasonings. Low-sodium mustard and ketchup. Sodium-free salad dressing. Sodium-free light mayonnaise. Fresh or refrigerated horseradish. Lemon juice. Vinegar. Homemade, reduced-sodium, or low-sodium soups. Unsalted popcorn and pretzels. Low-salt  or salt-free chips. What foods are not recommended? The items listed may not be a complete list. Talk with your dietitian about what dietary choices are best for you. Grains Instant hot cereals. Bread stuffing, pancake, and biscuit mixes. Croutons. Seasoned rice or pasta mixes. Noodle soup cups. Boxed or frozen macaroni and cheese. Regular salted crackers. Self-rising flour. Vegetables Sauerkraut, pickled vegetables, and relishes. Olives. [place] fries. Onion rings. Regular canned vegetables (not low-sodium or reduced-sodium). Regular canned tomato sauce and paste (not low-sodium or reduced-sodium). Regular tomato and vegetable juice (not low-sodium or reduced-sodium). Frozen vegetables in sauces. Meats and other protein foods Meat or fish that is salted, canned, smoked, spiced, or pickled. Bacon, ham, sausage, hotdogs, corned beef, chipped beef, packaged lunch meats, salt pork, jerky, pickled herring, anchovies, regular canned tuna, sardines, salted nuts. Dairy Processed cheese and cheese spreads. Cheese curds. Blue cheese. Feta cheese. String cheese. Regular cottage cheese. Buttermilk. Canned milk. Fats and oils Salted butter. Regular margarine. Ghee. Bacon fat. Seasonings and other foods Onion salt, garlic salt, seasoned salt, table salt, and sea salt. Canned and packaged gravies. Worcestershire sauce. Tartar sauce. Barbecue sauce. Teriyaki sauce. Soy sauce, including reduced-sodium. Steak sauce. Fish sauce. Oyster sauce. Cocktail sauce. Horseradish that you find on the shelf. Regular ketchup and mustard. Meat flavorings and tenderizers. Bouillon cubes. Hot sauce and Tabasco sauce. Premade or packaged marinades. Premade or packaged taco seasonings. Relishes. Regular salad dressings. Salsa. Potato and tortilla chips. Corn chips and puffs. Salted popcorn and pretzels. Canned or dried soups. Pizza. Frozen entrees and pot pies. Summary  Eating less sodium can help lower your blood pressure, reduce  swelling, and protect your heart, liver, and kidneys.  Most people on this plan should limit their sodium intake to 1,500-2,000 mg (milligrams) of sodium each day.  Canned, boxed, and frozen foods are high in sodium. Restaurant foods, fast foods, and pizza are also very high in sodium. You also get sodium by adding salt to food.  Try to cook at home, eat more fresh fruits and vegetables, and eat less fast food, canned, processed, or prepared foods. This information is not intended to replace advice given to you by your health care provider. Make sure you discuss any questions you have with your health care provider. Document Released: 06/21/2001 Document Revised: 12/24/2015 Document Reviewed: 12/24/2015 Elsevier Interactive Patient Education  [employer]2018 [employer]

## 2017-02-12 NOTE — Progress Notes (Signed)
02/12/2017 Burr Medico   23-Aug-1921  [id number]  Primary Physician Georgianne Fick, MD Primary Cardiologist: Dr. Clifton James   Reason for Visit/CC: Post ED F/u for Acute on Chronic Diastolic CHF  HPI:  84 yo male with history of DM, HTN, CAD, carotid artery stenosis here today for routien 1 year f/u.  He had been followed by Dr. Riley Kill for years but now followed by Dr. Clifton James. He has CAD with NSTEMI in June 2013 with complex PCI of the Circumflex with 2.5 mm Resolute DES in the proximal Circumflex. His cath showed heavily calcified RCA and LAD with moderate LAD stenosis.  He also has severe carotid artery disease with 100% occlusion of the RICA and high grade disease in the LICA. He has refused to have treatment of this disease. He was seen by Dr. Myra Gianotti in [facility] in July 2013 and discussed treatment options but refused any treatment. He did not tolerate Lisinopril due to cough.  He was recently seen in the [facility] ED, early December 2018 after sustaining a mechanical fall at home.  Luckily, hip plain films showed no fracture.  Head CT was also negative.  She had denied any chest pain.  Troponins were checked and were minimally evaded at 0.05 and 0.06.  This was not consistent with acute coronary syndrome.  The patient was however felt to be in acute on chronic diastolic CHF given evidence of 2+ bilateral lower extremity edema on exam.  However the patient was without any acute dyspnea.  Per ED provider note, patient case was discussed with Dr. Anne Fu who recommended increasing his diuretic regimen.  His home Lasix dosing was changed to 40 mg twice daily every other day.  The other days he only takes Lasix once daily. Supplemental potassium dose as also increased.   Patient presents back to clinic today with his granddaughters.  He reports that he has done well since his ED visit.  He is here today in a wheelchair but uses a walker to ambulate around at home.  He lives at home with family.   He denies any recurrent falls. But notes bilateraly leg weakness with he ambulates. He continues to have 1+ bilateral lower extremity edema on exam however he denies any resting dyspnea.  He occasionally has mild exertional dyspnea if he overexerts himself however he denies any chest pain.  He sleeps with 2 pillows at night and this has been the case for many years.  He denies any orthopnea nor PND.  Blood pressure is stable in clinic today.    No outpatient medications have been marked as taking for the 02/12/17 encounter (Office Visit) with Allayne Butcher, PA-C.   Allergies  Allergen Reactions  . Ativan [Lorazepam] Other (See Comments)    agitation   Past Medical History:  Diagnosis Date  . Anemia    a. Felt to be iatrogenic from procedures/sticks 06/2011  . Arthritis   . CAD (coronary artery disease)    a. NSTEMI s/p complex PCI to prox Cx 07/03/11  . Carotid arterial disease (HCC)    a. By MRI 05/2011;  b. 06/2011 Carotid U/S: LICA 80-99%, RICA 100%, patent vertebrals.  . Diabetes mellitus   . Encephalopathy    AMS during 06/2011 hospitalization - MRI of brain without infarct but did show R carotid dz, abnl EEG with nonspecific encephalopathy, felt secondary to drugs  . Hypertension   . Myocardial infarction (HCC)   . Transient atrial fibrillation or flutter    Brief  WCT during hospitalization 06/2011 felt to be afib with abberation, not a good coumadin candidate   Family History  Problem Relation Age of Onset  . Diabetes Mother   . Cancer Father   . Cancer Sister   . Cancer Brother   . Cancer Daughter    Past Surgical History:  Procedure Laterality Date  . APPENDECTOMY  1948  . LEFT HEART CATHETERIZATION WITH CORONARY ANGIOGRAM N/A 07/01/2011   Procedure: LEFT HEART CATHETERIZATION WITH CORONARY ANGIOGRAM;  Surgeon: Herby Abrahamhomas D Stuckey, MD;  [facility];  Service: Cardiovascular;  Laterality: N/A;  . PERCUTANEOUS CORONARY STENT INTERVENTION (PCI-S) N/A 07/03/2011    Procedure: PERCUTANEOUS CORONARY STENT INTERVENTION (PCI-S);  Surgeon: Herby Abrahamhomas D Stuckey, MD;  [facility];  Service: Cardiovascular;  Laterality: N/A;  . thumb surgery  1975   left   Social History   Socioeconomic History  . Marital status: Widowed    Spouse name: Not on file  . Number of children: Not on file  . Years of education: Not on file  . Highest education level: Not on file  Social Needs  . Financial resource strain: Not on file  . Food insecurity - worry: Not on file  . Food insecurity - inability: Not on file  . Transportation needs - medical: Not on file  . Transportation needs - non-medical: Not on file  Occupational History  . Not on file  Tobacco Use  . Smoking status: Former Smoker    Types: Cigarettes    Last attempt to quit: 01/13/1986    Years since quitting: 31.1  . Smokeless tobacco: Never Used  Substance and Sexual Activity  . Alcohol use: No  . Drug use: No  . Sexual activity: Not on file  Other Topics Concern  . Not on file  Social History Narrative  . Not on file     Review of Systems: General: negative for chills, fever, night sweats or weight changes.  Cardiovascular: negative for chest pain, dyspnea on exertion, edema, orthopnea, palpitations, paroxysmal nocturnal dyspnea or shortness of breath Dermatological: negative for rash Respiratory: negative for cough or wheezing Urologic: negative for hematuria Abdominal: negative for nausea, vomiting, diarrhea, bright red blood per rectum, melena, or hematemesis Neurologic: negative for visual changes, syncope, or dizziness All other systems reviewed and are otherwise negative except as noted above.   Physical Exam:  Height 5\' 6"  (1.676 m).  General appearance: alert, cooperative, no distress and elderly and frail Neck: no carotid bruit and no JVD Lungs: clear to auscultation bilaterally Heart: regular rate and rhythm, S1, S2 normal, no murmur, click, rub or gallop Extremities: 1+  bilateral pitting edema Pulses: 2+ and symmetric Skin: Skin color, texture, turgor normal. No rashes or lesions Neurologic: Grossly normal  EKG not performed -- personally reviewed   ASSESSMENT AND PLAN:   1. Chronic Diastolic HF: [facility] diuretic [facility] to 40 mg of PO Lasix BID, every other day, and once daily on other days. He continues to have 1+ bilateral LEE on exam, but no dyspnea, orthopnea or PND. Lung exam is unremarkable. Given his advanced age and fall risk, I would be conservative in regards to agressiveness of diuretic regimen. He seems to be doing well on current regimen. BP is stable. We will check f/u BMP to ensure renal function and K are stable. Pt advised to use compression stockings to help with edema and avoid high sodium foods. His family will call if he develops  any dyspnea or if swelling worsen.   2. CAD: h/o MI and Lcx stent in 2013. Stable w/o CP. Continue medical management.   3. Carotid Artery Diease: as outlined above, pt has opted on conservative management. Surgical management declined years ago. Overall seems to be doing well. No syncope or stroke like symptoms.   4. HTN: controlled on current regimen.   Keep yearly f/u with Dr. Clifton James later this spring. ~2-3 months.   Savina Olshefski Delmer Islam, MHS [facility] 02/12/2017 11:35 AM

## 2017-03-26 ENCOUNTER — Encounter: Payer: Self-pay | Admitting: Cardiovascular Disease

## 2017-03-26 ENCOUNTER — Ambulatory Visit (INDEPENDENT_AMBULATORY_CARE_PROVIDER_SITE_OTHER): Payer: Medicare Other | Admitting: Cardiovascular Disease

## 2017-03-26 VITALS — BP 130/60 | HR 69 | Ht 66.0 in | Wt 156.2 lb

## 2017-03-26 DIAGNOSIS — I6523 Occlusion and stenosis of bilateral carotid arteries: Secondary | ICD-10-CM | POA: Diagnosis not present

## 2017-03-26 DIAGNOSIS — I2510 Atherosclerotic heart disease of native coronary artery without angina pectoris: Secondary | ICD-10-CM

## 2017-03-26 DIAGNOSIS — I5032 Chronic diastolic (congestive) heart failure: Secondary | ICD-10-CM

## 2017-03-26 DIAGNOSIS — I10 Essential (primary) hypertension: Secondary | ICD-10-CM | POA: Diagnosis not present

## 2017-03-26 NOTE — Progress Notes (Signed)
Chief Complaint  Patient presents with  . Follow-up    CAD    History of Present Illness: 84 yo male with history of DM, HTN, CAD, carotid artery stenosis here today for cardiac follow up. He had been followed by Dr. Riley Kill for years. He has CAD with NSTEMI June 2013 with complex PCI of the Circumflex in June 2013 with DES placement in the proximal Circumflex. His cath showed heavily calcified RCA and LAD with moderate LAD stenosis.  He also has severe carotid artery disease with 100% occlusion of the RICA and high grade disease in the LICA. He has refused to have treatment of this disease. He was seen by Dr. Myra Gianotti in [facility] in July 2013 and discussed treatment options but refused any treatment. He did not tolerate Lisinopril due to cough.  He is here today for follow up. The patient denies any chest pain, dyspnea, palpitations, lower extremity edema, orthopnea, PND, dizziness, near syncope or syncope. He is feeling great. He still walks around his house. He did have an episode several weeks back where he was unable to speak for several hours. His family assumed this was a TIA. Medical attention was not sought. His symptoms quickly resolved.   Primary Care Physician: Georgianne Fick, MD  Past Medical History:  Diagnosis Date  . Anemia    a. Felt to be iatrogenic from procedures/sticks 06/2011  . Arthritis   . CAD (coronary artery disease)    a. NSTEMI s/p complex PCI to prox Cx 07/03/11  . Carotid arterial disease (HCC)    a. By MRI 05/2011;  b. 06/2011 Carotid U/S: LICA 80-99%, RICA 100%, patent vertebrals.  . Diabetes mellitus   . Encephalopathy    AMS during 06/2011 hospitalization - MRI of brain without infarct but did show R carotid dz, abnl EEG with nonspecific encephalopathy, felt secondary to drugs  . Hypertension   . Myocardial infarction (HCC)   . Transient atrial fibrillation or flutter    Brief WCT during hospitalization 06/2011 felt to be afib with abberation, not a good  coumadin candidate    Past Surgical History:  Procedure Laterality Date  . APPENDECTOMY  1948  . LEFT HEART CATHETERIZATION WITH CORONARY ANGIOGRAM N/A 07/01/2011   Procedure: LEFT HEART CATHETERIZATION WITH CORONARY ANGIOGRAM;  Surgeon: Herby Abraham, MD;  Location[facility] C[facility] LAB;  Service: Cardiovascular;  Laterality: N/A;  . PERCUTANEOUS CORONARY STENT INTERVENTION (PCI-S) N/A 07/03/2011   Procedure: PERCUTANEOUS CORONARY STENT INTERVENTION (PCI-S);  Surgeon: Herby Abraham, MD;  Location: [facility] CATH LAB;  Service: Cardiovascular;  Laterality: N/A;  . thumb surgery  1975   left    Current Outpatient Medications  Medication Sig Dispense Refill  . acetaminophen (TYLENOL) 325 MG tablet Take 650 mg by mouth every 6 (six) hours as needed (pain).     Marland Kitchen aspirin 81 MG tablet Take 1 tablet (81 mg total) by mouth daily.    . carvedilol (COREG) 12.5 MG tablet Take 6.25 mg by mouth 2 (two) times daily with a meal.    . clopidogrel (PLAVIX) 75 MG tablet Take 1 tablet (75 mg total) by mouth daily. 30 tablet 11  . fish oil-omega-3 fatty acids 1000 MG capsule Take 1 g by mouth 3 (three) times daily.     . furosemide (LASIX) 40 MG tablet Take 40 mg by mouth as directed. Pt takes one tablet daily and two tablets every other day    . insulin detemir (LEVEMIR) 100 UNIT/ML injection Inject 36-40 Units  into the skin 2 (two) times daily. 38 units with breakfast and 12 units with dinner     . isosorbide mononitrate (IMDUR) 30 MG 24 hr tablet Take 30 mg by mouth daily after lunch.    . ketoconazole (NIZORAL) 2 % cream Apply 1 application topically as needed. For rash    . levothyroxine (SYNTHROID, LEVOTHROID) 50 MCG tablet Take 1 tablet (50 mcg total) by mouth daily. 30 tablet 3  . nitroGLYCERIN (NITROSTAT) 0.4 MG SL tablet Place 1 tablet (0.4 mg total) under the tongue every 5 (five) minutes x 3 doses as needed for chest pain. 25 tablet 4  . pantoprazole (PROTONIX) 40 MG tablet Take 40 mg by mouth daily after  lunch.     . potassium chloride (K-DUR) 10 MEQ tablet Take 10 mEq by mouth daily.   0  . potassium chloride (K-DUR) 10 MEQ tablet Take 10 mEq by mouth as directed. Pt takes one and one-half a tablet daily    . simvastatin (ZOCOR) 40 MG tablet Take 20 mg by mouth daily. Pt takes 1/2 tablet at bedtime     . Specialty Vitamins Products (PROSTATE PO) Take 600 mg by mouth daily. Super beta prostate, 600 mg      No current facility-administered medications for this visit.     Allergies  Allergen Reactions  . Ativan [Lorazepam] Other (See Comments)    agitation    Social History   Socioeconomic History  . Marital status: Widowed    Spouse name: Not on file  . Number of children: Not on file  . Years of education: Not on file  . Highest education level: Not on file  Social Needs  . Financial resource strain: Not on file  . Food insecurity - worry: Not on file  . Food insecurity - inability: Not on file  . Transportation needs - medical: Not on file  . Transportation needs - non-medical: Not on file  Occupational History  . Not on file  Tobacco Use  . Smoking status: Former Smoker    Types: Cigarettes    Last attempt to quit: 01/13/1986    Years since quitting: 31.2  . Smokeless tobacco: Never Used  Substance and Sexual Activity  . Alcohol use: No  . Drug use: No  . Sexual activity: Not on file  Other Topics Concern  . Not on file  Social History Narrative  . Not on file    Family History  Problem Relation Age of Onset  . Diabetes Mother   . Cancer Father   . Cancer Sister   . Cancer Brother   . Cancer Daughter     Review of Systems:  As stated in the HPI and otherwise negative.   BP 130/60   Pulse 69   Ht 5\' 6"  (1.676 m)   Wt 156 lb 3.2 oz (70.9 kg)   SpO2 96%   BMI 25.21 kg/m   Physical Examination:  General: Well developed, well nourished, NAD  HEENT: OP clear, mucus membranes moist  SKIN: warm, dry. No rashes. Neuro: No focal deficits  Musculoskeletal:  Muscle strength 5/5 all ext  Psychiatric: Mood and affect normal  Neck: No JVD, no carotid bruits, no thyromegaly, no lymphadenopathy.  Lungs:Clear bilaterally, no wheezes, rhonci, crackles Cardiovascular: Regular rate and rhythm. No murmurs, gallops or rubs. Abdomen:Soft. Bowel sounds present. Non-tender.  Extremities: No lower extremity edema. Pulses are 2 + in the bilateral DP/PT.  Cardiac cath 07/01/11: Left mainstem: The vessel is heavily calcified,  but without obstruction.  Left anterior descending (LAD): The vessel is also heavily calcified. There is moderate bifurcation plaque at the takeoff of the second diagonal. More distally, there is a 60% eccentric area of narrowing that does not appear flow limiting. The large D2 also has some ostial narrowing that does not appear critical.  Left circumflex (LCx): The CFX has a short common trunk then divides to a large OM1 and AV circumflex. Just after the OM there is a calcified high grade stenosis leading to a large OM vessel. There is some plaquing of 50% in the mid OM1 and the 95 % lesion in the AV circ is calcified, and abuts the OM1. The distal AV circ is quite large.  Right coronary artery (RCA): The vessel is calcified and supplies a large PD and PL branch. There is calcification throughout without critical narrowing. The PDA has some mild ostial narrowing.  Left ventriculography: Left ventricular systolic function is normal, LVEF is estimated at 55-65%, there is no significant mitral regurgitation   Echo June 2013: Left ventricle: The cavity size was normal. Wall thickness was increased in a pattern of moderate to severe LVH. Systolic function was vigorous. The estimated ejection fraction was in the range of 65% to 70%. Wall motion was normal; there were no regional wall motion abnormalities. Doppler parameters are consistent with abnormal left ventricular relaxation (grade 1 diastolic dysfunction). - Aortic valve: Cusp separation was  mildly reduced. Trivial regurgitation. - Mitral valve: Mild regurgitation. - Pulmonary arteries: Systolic pressure was mildly increased. PA peak pressure: 32mm Hg (S).  EKG:  EKG is not ordered today. The ekg ordered today demonstrates   Recent Labs: 12/19/2016: ALT 14; Hemoglobin 13.6; Platelets 154 02/12/2017: BUN 29; Creatinine, Ser 1.50; Potassium 4.0; Sodium 133   Lipid Panel    Component Value Date/Time   CHOL 123 10/20/2011 1112   TRIG 62.0 10/20/2011 1112   HDL 50.70 10/20/2011 1112   CHOLHDL 2 10/20/2011 1112   VLDL 12.4 10/20/2011 1112   LDLCALC 60 10/20/2011 1112     Wt Readings from Last 3 Encounters:  03/26/17 156 lb 3.2 oz (70.9 kg)  02/12/17 157 lb (71.2 kg)  12/19/16 180 lb (81.6 kg)     Other studies Reviewed: Additional studies/ records that were reviewed today include: . Review of the above records demonstrates:    Assessment and Plan:   1. CAD without angina:  No chest pain. Continue ASA and Plavix. He does not wish to have further cardiac testing given advanced age.    2. Carotid artery disease: Severe disease in LICA with occluded RICA. Has been seen in the vascular surgery office but refuses treatment. He has no desire for aggressive management. It sounds like he may have had a TIA. Since he is not an operative candidate, will continue ASA and Plavix.    3. HTN: BP is well controlled. NO changes today.   4. Chronic diastolic CHF: He is now taking Lasix 80 mg alternating with 40 mg every other day.  Weight today is stable. Continue Lasix.    Current medicines are reviewed at length with the patient today.  The patient does not have concerns regarding medicines.  The following changes have been made:  no change  Labs/ tests ordered today include:   No orders of the defined types were placed in this encounter.   Disposition:  Follow up with me in 12  months  Signed, Verne Carrowhristopher McAlhany, MD 03/26/2017 2:47 PM    [facility] [address] [place], [place], [place]  [address] Phone: [phone]; Fax: [phone]

## 2017-03-26 NOTE — Patient Instructions (Signed)
Medication Instructions:  Your physician recommends that you continue on your current medications as directed. Please refer to the Current Medication list given to you today.   Labwork: none  Testing/Procedures: none  Follow-Up: Your physician recommends that you schedule a follow-up appointment in: 12 months. Please call our office in about 8-9 months to schedule this appointment   Any Other Special Instructions Will Be Listed Below (If Applicable).     If you need a refill on your cardiac medications before your next appointment, please call your pharmacy.

## 2017-04-15 ENCOUNTER — Other Ambulatory Visit: Payer: Self-pay

## 2017-04-15 ENCOUNTER — Encounter (HOSPITAL_BASED_OUTPATIENT_CLINIC_OR_DEPARTMENT_OTHER): Payer: Self-pay | Admitting: Emergency Medicine

## 2017-04-15 ENCOUNTER — Emergency Department (HOSPITAL_BASED_OUTPATIENT_CLINIC_OR_DEPARTMENT_OTHER)
Admission: EM | Admit: 2017-04-15 | Discharge: 2017-04-15 | Disposition: A | Discharge status: Home / Self Care | Payer: Medicare Other | Attending: Emergency Medicine | Admitting: Emergency Medicine

## 2017-04-15 DIAGNOSIS — R197 Diarrhea, unspecified: Secondary | ICD-10-CM | POA: Insufficient documentation

## 2017-04-15 DIAGNOSIS — I2510 Atherosclerotic heart disease of native coronary artery without angina pectoris: Secondary | ICD-10-CM | POA: Insufficient documentation

## 2017-04-15 DIAGNOSIS — I252 Old myocardial infarction: Secondary | ICD-10-CM | POA: Diagnosis not present

## 2017-04-15 DIAGNOSIS — Z794 Long term (current) use of insulin: Secondary | ICD-10-CM | POA: Insufficient documentation

## 2017-04-15 DIAGNOSIS — R112 Nausea with vomiting, unspecified: Secondary | ICD-10-CM | POA: Insufficient documentation

## 2017-04-15 DIAGNOSIS — R4182 Altered mental status, unspecified: Secondary | ICD-10-CM | POA: Insufficient documentation

## 2017-04-15 DIAGNOSIS — Z87891 Personal history of nicotine dependence: Secondary | ICD-10-CM | POA: Diagnosis not present

## 2017-04-15 DIAGNOSIS — E119 Type 2 diabetes mellitus without complications: Secondary | ICD-10-CM | POA: Insufficient documentation

## 2017-04-15 DIAGNOSIS — Z7902 Long term (current) use of antithrombotics/antiplatelets: Secondary | ICD-10-CM | POA: Insufficient documentation

## 2017-04-15 DIAGNOSIS — Z79899 Other long term (current) drug therapy: Secondary | ICD-10-CM | POA: Insufficient documentation

## 2017-04-15 DIAGNOSIS — Z7982 Long term (current) use of aspirin: Secondary | ICD-10-CM | POA: Insufficient documentation

## 2017-04-15 DIAGNOSIS — I10 Essential (primary) hypertension: Secondary | ICD-10-CM | POA: Diagnosis not present

## 2017-04-15 LAB — CBC WITH DIFFERENTIAL/PLATELET
Basophils Absolute: 0 10*3/uL (ref 0.0–0.1)
Basophils Relative: 0 %
EOS PCT: 3 %
Eosinophils Absolute: 0.1 10*3/uL (ref 0.0–0.7)
HCT: 37.7 % — ABNORMAL LOW (ref 39.0–52.0)
HEMOGLOBIN: 12.8 g/dL — AB (ref 13.0–17.0)
LYMPHS ABS: 1.1 10*3/uL (ref 0.7–4.0)
LYMPHS PCT: 32 %
MCH: 31.3 pg (ref 26.0–34.0)
MCHC: 34 g/dL (ref 30.0–36.0)
MCV: 92.2 fL (ref 78.0–100.0)
Monocytes Absolute: 0.5 10*3/uL (ref 0.1–1.0)
Monocytes Relative: 14 %
NEUTROS ABS: 1.7 10*3/uL (ref 1.7–7.7)
NEUTROS PCT: 51 %
PLATELETS: 155 10*3/uL (ref 150–400)
RBC: 4.09 MIL/uL — AB (ref 4.22–5.81)
RDW: 13.1 % (ref 11.5–15.5)
WBC: 3.4 10*3/uL — AB (ref 4.0–10.5)

## 2017-04-15 LAB — COMPREHENSIVE METABOLIC PANEL
ALT: 13 U/L — AB (ref 17–63)
AST: 25 U/L (ref 15–41)
Albumin: 3.2 g/dL — ABNORMAL LOW (ref 3.5–5.0)
Alkaline Phosphatase: 49 U/L (ref 38–126)
Anion gap: 12 (ref 5–15)
BUN: 38 mg/dL — AB (ref 6–20)
CALCIUM: 8.2 mg/dL — AB (ref 8.9–10.3)
CHLORIDE: 101 mmol/L (ref 101–111)
CO2: 20 mmol/L — AB (ref 22–32)
CREATININE: 1.7 mg/dL — AB (ref 0.61–1.24)
GFR calc Af Amer: 38 mL/min — ABNORMAL LOW (ref >60)
GFR, EST NON AFRICAN AMERICAN: 33 mL/min — AB (ref >60)
Glucose, Bld: 277 mg/dL — ABNORMAL HIGH (ref 65–99)
Potassium: 3.8 mmol/L (ref 3.5–5.1)
SODIUM: 133 mmol/L — AB (ref 135–145)
Total Bilirubin: 0.6 mg/dL (ref 0.3–1.2)
Total Protein: 7.1 g/dL (ref 6.5–8.1)

## 2017-04-15 LAB — LIPASE, BLOOD: Lipase: 21 U/L (ref 11–51)

## 2017-04-15 MED ORDER — ONDANSETRON 4 MG PO TBDP: ORAL_TABLET | ORAL | 0 refills | Status: AC

## 2017-04-15 MED ORDER — ONDANSETRON HCL 4 MG/2ML IJ SOLN
4.0000 mg | Freq: Once | INTRAMUSCULAR | Status: AC
  Administered 2017-04-15: 4 mg via INTRAVENOUS
  Filled 2017-04-15: qty 2

## 2017-04-15 MED ORDER — SODIUM CHLORIDE 0.9 % IV BOLUS
1000.0000 mL | Freq: Once | INTRAVENOUS | Status: AC
  Administered 2017-04-15: 1000 mL via INTRAVENOUS

## 2017-04-15 NOTE — Discharge Instructions (Signed)
Try Imodium for diarrhea.  Follow-up with your family doctor.  Return for abdominal pain fever inability to eat or drink

## 2017-04-15 NOTE — ED Triage Notes (Signed)
Pt granddaughter states pt has been confused for 2 days with diarrhea.

## 2017-04-15 NOTE — ED Provider Notes (Signed)
[facility] EMERGENCY DEPARTMENT Provider Note   CSN: [id number] Arrival date & time: 04/15/17  1629     History   Chief Complaint Chief Complaint  Patient presents with  . Altered Mental Status  . Diarrhea    HPI Levi Robinson is a 84 y.o. male.  84 yo M with a chief complaint of nausea vomiting and diarrhea.  This been going on for the past couple days.  The first 2 days of illness the patient was nonverbal and was unable to get out of bed.  Since then he has somewhat improved.  Still having some difficulty tolerating p.o. and is having multiple bowel movements today.  Describes them as soft and brown.  Denies recent antibiotic use denies head injury denies abdominal pain.  The patient has had some subjective fevers and chills but no measured temperature at home.  Unsure of sick contacts.  The patient went to physical therapy the day before this started.  They deny dark or bloody stool.  Denied bilious or bloody emesis.  The history is provided by the patient and a relative.  Altered Mental Status   Pertinent negatives include no confusion.  Diarrhea   Associated symptoms include vomiting. Pertinent negatives include no abdominal pain, no chills, no headaches, no arthralgias and no myalgias.  Illness  This is a new problem. The current episode started 2 days ago. The problem occurs constantly. The problem has been gradually improving. Pertinent negatives include no chest pain, no abdominal pain, no headaches and no shortness of breath. Nothing aggravates the symptoms. Nothing relieves the symptoms. He has tried nothing for the symptoms. The treatment provided no relief.    Past Medical History:  Diagnosis Date  . Anemia    a. Felt to be iatrogenic from procedures/sticks 06/2011  . Arthritis   . CAD (coronary artery disease)    a. NSTEMI s/p complex PCI to prox Cx 07/03/11  . Carotid arterial disease (HCC)    a. By MRI 05/2011;  b. 06/2011 Carotid U/S: LICA 80-99%, RICA  100%, patent vertebrals.  . Diabetes mellitus   . Encephalopathy    AMS during 06/2011 hospitalization - MRI of brain without infarct but did show R carotid dz, abnl EEG with nonspecific encephalopathy, felt secondary to drugs  . Hypertension   . Myocardial infarction (HCC)   . Transient atrial fibrillation or flutter    Brief WCT during hospitalization 06/2011 felt to be afib with abberation, not a good coumadin candidate    Patient Active Problem List   Diagnosis Date Noted  . Occlusion and stenosis of carotid artery without mention of cerebral infarction 08/04/2011  . Hypertension   . CAD (coronary artery disease)   . Carotid arterial disease (HCC)   . Transient atrial fibrillation or flutter   . Anemia   . Posterior vitreous detachment 06/05/2011  . Status post intraocular lens implant 06/05/2011  . Branch retinal vein occlusion 12/12/2010  . Nonproliferative diabetic retinopathy (HCC) 12/12/2010    Past Surgical History:  Procedure Laterality Date  . APPENDECTOMY  1948  . LEFT HEART CATHETERIZATION WITH CORONARY ANGIOGRAM N/A 07/01/2011   Procedure: LEFT HEART CATHETERIZATION WITH CORONARY ANGIOGRAM;  Surgeon: Herby Abraham, MD;  Location: [facility] CATH LAB;  Service: Cardiovascular;  Laterality: N/A;  . PERCUTANEOUS CORONARY STENT INTERVENTION (PCI-S) N/A 07/03/2011   Procedure: PERCUTANEOUS CORONARY STENT INTERVENTION (PCI-S);  Surgeon: Herby Abraham, MD;  Location: [facility] CATH LAB;  Service: Cardiovascular;  Laterality: N/A;  . thumb  surgery  1975   left        Home Medications    Prior to Admission medications   Medication Sig Start Date End Date Taking? Authorizing Provider  acetaminophen (TYLENOL) 325 MG tablet Take 650 mg by mouth every 6 (six) hours as needed (pain).     [provider]  aspirin 81 MG tablet Take 1 tablet (81 mg total) by mouth daily. 07/04/11   Dunn, Tacey Ruizayna N, PA-C  carvedilol (COREG) 12.5 MG tablet Take 6.25 mg by mouth 2 (two) times daily  with a meal.    [provider]  clopidogrel (PLAVIX) 75 MG tablet Take 1 tablet (75 mg total) by mouth daily. 05/24/13   Kathleene HazelMcAlhany, Christopher D, MD  fish oil-omega-3 fatty acids 1000 MG capsule Take 1 g by mouth 3 (three) times daily.     [provider]  furosemide (LASIX) 40 MG tablet Take 40 mg by mouth as directed. Pt takes one tablet daily and two tablets every other day    [provider]  insulin detemir (LEVEMIR) 100 UNIT/ML injection Inject 36-40 Units into the skin 2 (two) times daily. 38 units with breakfast and 12 units with dinner  07/04/11   Dunn, Tacey Ruizayna N, PA-C  isosorbide mononitrate (IMDUR) 30 MG 24 hr tablet Take 30 mg by mouth daily after lunch. 07/04/11   Dunn, Tacey Ruizayna N, PA-C  ketoconazole (NIZORAL) 2 % cream Apply 1 application topically as needed. For rash 07/28/11   [provider]  levothyroxine (SYNTHROID, LEVOTHROID) 50 MCG tablet Take 1 tablet (50 mcg total) by mouth daily. 06/22/14   Kathleene HazelMcAlhany, Christopher D, MD  nitroGLYCERIN (NITROSTAT) 0.4 MG SL tablet Place 1 tablet (0.4 mg total) under the tongue every 5 (five) minutes x 3 doses as needed for chest pain. 07/04/11   Laurann Montanaunn, Dayna N, PA-C  ondansetron (ZOFRAN ODT) 4 MG disintegrating tablet 4mg  ODT q4 hours prn nausea/vomit 04/15/17   Melene PlanFloyd, Kaseem Vastine, DO  pantoprazole (PROTONIX) 40 MG tablet Take 40 mg by mouth daily after lunch.     [provider]  potassium chloride (K-DUR) 10 MEQ tablet Take 10 mEq by mouth daily.  11/07/13   [provider]  potassium chloride (K-DUR) 10 MEQ tablet Take 10 mEq by mouth as directed. Pt takes one and one-half a tablet daily    [provider]  simvastatin (ZOCOR) 40 MG tablet Take 20 mg by mouth daily. Pt takes 1/2 tablet at bedtime     [provider]  Specialty Vitamins Products (PROSTATE PO) Take 600 mg by mouth daily. Super beta prostate, 600 mg     [provider]    Family History Family History  Problem  Relation Age of Onset  . Diabetes Mother   . Cancer Father   . Cancer Sister   . Cancer Brother   . Cancer Daughter     Social History Social History   Tobacco Use  . Smoking status: Former Smoker    Types: Cigarettes    Last attempt to quit: 01/13/1986    Years since quitting: 31.2  . Smokeless tobacco: Never Used  Substance Use Topics  . Alcohol use: No  . Drug use: No     Allergies   Ativan [lorazepam]   Review of Systems Review of Systems  Constitutional: Negative for chills and fever.  HENT: Negative for congestion and facial swelling.   Eyes: Negative for discharge and visual disturbance.  Respiratory: Negative for shortness of breath.  Cardiovascular: Negative for chest pain and palpitations.  Gastrointestinal: Positive for diarrhea, nausea and vomiting. Negative for abdominal pain.  Musculoskeletal: Negative for arthralgias and myalgias.  Skin: Negative for color change and rash.  Neurological: Negative for tremors, syncope and headaches.  Psychiatric/Behavioral: Negative for confusion and dysphoric mood.     Physical Exam Updated Vital Signs BP (!) 131/56 (BP Location: Right Arm)   Pulse 75   Temp 98.5 F (36.9 C) (Oral)   Resp 18   SpO2 96%   Physical Exam  Constitutional: He is oriented to person, place, and time. He appears well-developed and well-nourished.  Pallor   HENT:  Head: Normocephalic and atraumatic.  Eyes: Pupils are equal, round, and reactive to light. EOM are normal.  Neck: Normal range of motion. Neck supple. No JVD present.  Cardiovascular: Normal rate and regular rhythm. Exam reveals no gallop and no friction rub.  No murmur heard. Pulmonary/Chest: No respiratory distress. He has no wheezes.  Abdominal: He exhibits no distension and no mass. There is no tenderness. There is no rebound and no guarding.  Musculoskeletal: Normal range of motion.  Neurological: He is alert and oriented to person, place, and time.  Skin: No rash  noted. No pallor.  Psychiatric: He has a normal mood and affect. His behavior is normal.  Nursing note and vitals reviewed.    ED Treatments / Results  Labs (all labs ordered are listed, but only abnormal results are displayed) Labs Reviewed  CBC WITH DIFFERENTIAL/PLATELET - Abnormal; Notable for the following components:      Result Value   WBC 3.4 (*)    RBC 4.09 (*)    Hemoglobin 12.8 (*)    HCT 37.7 (*)    All other components within normal limits  COMPREHENSIVE METABOLIC PANEL - Abnormal; Notable for the following components:   Sodium 133 (*)    CO2 20 (*)    Glucose, Bld 277 (*)    BUN 38 (*)    Creatinine, Ser 1.70 (*)    Calcium 8.2 (*)    Albumin 3.2 (*)    ALT 13 (*)    GFR calc non Af Amer 33 (*)    GFR calc Af Amer 38 (*)    All other components within normal limits  LIPASE, BLOOD    EKG None  Radiology No results found.  Procedures Procedures (including critical care time)  Medications Ordered in ED Medications  sodium chloride 0.9 % bolus 1,000 mL (1,000 mLs Intravenous New Bag/Given 04/15/17 1700)  ondansetron (ZOFRAN) injection 4 mg (4 mg Intravenous Given 04/15/17 1700)     Initial Impression / Assessment and Plan / ED Course  I have reviewed the triage vital signs and the nursing notes.  Pertinent labs & imaging results that were available during my care of the patient were reviewed by me and considered in my medical decision making (see chart for details).     84 yo M with nausea vomiting diarrhea and weakness.  The patient is gotten significantly better over the past couple days.  Family is concerned that he may be dehydrated.  He looks somewhat pale to me will obtain lab work give IV fluids and reassess.  Feeling better, tolerating PO.  D/c home.   5:58 PM:  I have discussed the diagnosis/risks/treatment options with the patient and believe the pt to be eligible for discharge home to follow-up with PCP. We also discussed returning to the ED  immediately if new or worsening sx occur.  We discussed the sx which are most concerning (e.g., sudden worsening pain, fever, inability to tolerate by mouth) that necessitate immediate return. Medications administered to the patient during their visit and any new prescriptions provided to the patient are listed below.  Medications given during this visit Medications  sodium chloride 0.9 % bolus 1,000 mL (1,000 mLs Intravenous New Bag/Given 04/15/17 1700)  ondansetron (ZOFRAN) injection 4 mg (4 mg Intravenous Given 04/15/17 1700)     The patient appears reasonably screen and/or stabilized for discharge and I doubt any other medical condition or other [facility] requiring further screening, evaluation, or treatment in the ED at this time prior to discharge.    Final Clinical Impressions(s) / ED Diagnoses   Final diagnoses:  Nausea vomiting and diarrhea    ED Discharge Orders        Ordered    ondansetron (ZOFRAN ODT) 4 MG disintegrating tablet     04/15/17 1755       Melene Plan, DO 04/15/17 1758

## 2017-04-15 NOTE — ED Notes (Signed)
Pt has not had regular insulin today per family at bedside.

## 2017-04-24 ENCOUNTER — Other Ambulatory Visit: Payer: Self-pay | Admitting: Cardiovascular Disease

## 2017-05-25 DIAGNOSIS — Z794 Long term (current) use of insulin: Secondary | ICD-10-CM | POA: Diagnosis not present

## 2017-05-25 DIAGNOSIS — Z9641 Presence of insulin pump (external) (internal): Secondary | ICD-10-CM | POA: Diagnosis not present

## 2017-05-25 DIAGNOSIS — E109 Type 1 diabetes mellitus without complications: Secondary | ICD-10-CM | POA: Diagnosis not present

## 2017-06-05 ENCOUNTER — Inpatient Hospital Stay (HOSPITAL_COMMUNITY)
Admission: EM | Admit: 2017-06-05 | Discharge: 2017-06-10 | DRG: 082 | Disposition: A | Discharge status: Home Health | Payer: Medicare Other | Attending: Internal Medicine | Admitting: Internal Medicine

## 2017-06-05 ENCOUNTER — Emergency Department (HOSPITAL_COMMUNITY): Payer: Medicare Other

## 2017-06-05 DIAGNOSIS — I130 Hypertensive heart and chronic kidney disease with heart failure and stage 1 through stage 4 chronic kidney disease, or unspecified chronic kidney disease: Secondary | ICD-10-CM | POA: Diagnosis not present

## 2017-06-05 DIAGNOSIS — R402362 Coma scale, best motor response, obeys commands, at arrival to emergency department: Secondary | ICD-10-CM | POA: Diagnosis present

## 2017-06-05 DIAGNOSIS — S0101XA Laceration without foreign body of scalp, initial encounter: Secondary | ICD-10-CM | POA: Diagnosis not present

## 2017-06-05 DIAGNOSIS — G935 Compression of brain: Secondary | ICD-10-CM | POA: Diagnosis present

## 2017-06-05 DIAGNOSIS — R4182 Altered mental status, unspecified: Secondary | ICD-10-CM | POA: Diagnosis present

## 2017-06-05 DIAGNOSIS — Z955 Presence of coronary angioplasty implant and graft: Secondary | ICD-10-CM

## 2017-06-05 DIAGNOSIS — I482 Chronic atrial fibrillation: Secondary | ICD-10-CM | POA: Diagnosis not present

## 2017-06-05 DIAGNOSIS — E11649 Type 2 diabetes mellitus with hypoglycemia without coma: Secondary | ICD-10-CM | POA: Diagnosis not present

## 2017-06-05 DIAGNOSIS — N183 Chronic kidney disease, stage 3 unspecified: Secondary | ICD-10-CM

## 2017-06-05 DIAGNOSIS — J811 Chronic pulmonary edema: Secondary | ICD-10-CM | POA: Diagnosis not present

## 2017-06-05 DIAGNOSIS — I5032 Chronic diastolic (congestive) heart failure: Secondary | ICD-10-CM | POA: Diagnosis present

## 2017-06-05 DIAGNOSIS — I4891 Unspecified atrial fibrillation: Secondary | ICD-10-CM | POA: Diagnosis not present

## 2017-06-05 DIAGNOSIS — I480 Paroxysmal atrial fibrillation: Secondary | ICD-10-CM

## 2017-06-05 DIAGNOSIS — S065XAA Traumatic subdural hemorrhage with loss of consciousness status unknown, initial encounter: Secondary | ICD-10-CM | POA: Diagnosis present

## 2017-06-05 DIAGNOSIS — Z23 Encounter for immunization: Secondary | ICD-10-CM | POA: Diagnosis not present

## 2017-06-05 DIAGNOSIS — Z87891 Personal history of nicotine dependence: Secondary | ICD-10-CM

## 2017-06-05 DIAGNOSIS — E1165 Type 2 diabetes mellitus with hyperglycemia: Secondary | ICD-10-CM | POA: Diagnosis present

## 2017-06-05 DIAGNOSIS — IMO0002 Reserved for concepts with insufficient information to code with codable children: Secondary | ICD-10-CM | POA: Diagnosis present

## 2017-06-05 DIAGNOSIS — Y92009 Unspecified place in unspecified non-institutional (private) residence as the place of occurrence of the external cause: Secondary | ICD-10-CM

## 2017-06-05 DIAGNOSIS — Z888 Allergy status to other drugs, medicaments and biological substances status: Secondary | ICD-10-CM

## 2017-06-05 DIAGNOSIS — E0865 Diabetes mellitus due to underlying condition with hyperglycemia: Secondary | ICD-10-CM | POA: Diagnosis not present

## 2017-06-05 DIAGNOSIS — S065X0A Traumatic subdural hemorrhage without loss of consciousness, initial encounter: Secondary | ICD-10-CM | POA: Diagnosis not present

## 2017-06-05 DIAGNOSIS — R402242 Coma scale, best verbal response, confused conversation, at arrival to emergency department: Secondary | ICD-10-CM | POA: Diagnosis present

## 2017-06-05 DIAGNOSIS — S065X9A Traumatic subdural hemorrhage with loss of consciousness of unspecified duration, initial encounter: Principal | ICD-10-CM | POA: Diagnosis present

## 2017-06-05 DIAGNOSIS — I252 Old myocardial infarction: Secondary | ICD-10-CM | POA: Diagnosis not present

## 2017-06-05 DIAGNOSIS — Z66 Do not resuscitate: Secondary | ICD-10-CM | POA: Diagnosis present

## 2017-06-05 DIAGNOSIS — S79912A Unspecified injury of left hip, initial encounter: Secondary | ICD-10-CM | POA: Diagnosis not present

## 2017-06-05 DIAGNOSIS — I10 Essential (primary) hypertension: Secondary | ICD-10-CM | POA: Diagnosis not present

## 2017-06-05 DIAGNOSIS — R4020 Unspecified coma: Secondary | ICD-10-CM | POA: Diagnosis not present

## 2017-06-05 DIAGNOSIS — G9341 Metabolic encephalopathy: Secondary | ICD-10-CM | POA: Diagnosis not present

## 2017-06-05 DIAGNOSIS — S79911A Unspecified injury of right hip, initial encounter: Secondary | ICD-10-CM | POA: Diagnosis not present

## 2017-06-05 DIAGNOSIS — Z794 Long term (current) use of insulin: Secondary | ICD-10-CM

## 2017-06-05 DIAGNOSIS — I5031 Acute diastolic (congestive) heart failure: Secondary | ICD-10-CM

## 2017-06-05 DIAGNOSIS — Z7902 Long term (current) use of antithrombotics/antiplatelets: Secondary | ICD-10-CM

## 2017-06-05 DIAGNOSIS — Z7982 Long term (current) use of aspirin: Secondary | ICD-10-CM

## 2017-06-05 DIAGNOSIS — Z79890 Hormone replacement therapy (postmenopausal): Secondary | ICD-10-CM

## 2017-06-05 DIAGNOSIS — W19XXXA Unspecified fall, initial encounter: Secondary | ICD-10-CM | POA: Diagnosis present

## 2017-06-05 DIAGNOSIS — S0990XA Unspecified injury of head, initial encounter: Secondary | ICD-10-CM | POA: Diagnosis not present

## 2017-06-05 DIAGNOSIS — F0390 Unspecified dementia without behavioral disturbance: Secondary | ICD-10-CM | POA: Diagnosis present

## 2017-06-05 DIAGNOSIS — E1122 Type 2 diabetes mellitus with diabetic chronic kidney disease: Secondary | ICD-10-CM | POA: Diagnosis present

## 2017-06-05 DIAGNOSIS — G960 Cerebrospinal fluid leak: Secondary | ICD-10-CM | POA: Diagnosis not present

## 2017-06-05 DIAGNOSIS — I618 Other nontraumatic intracerebral hemorrhage: Secondary | ICD-10-CM | POA: Diagnosis not present

## 2017-06-05 DIAGNOSIS — G4489 Other headache syndrome: Secondary | ICD-10-CM | POA: Diagnosis not present

## 2017-06-05 DIAGNOSIS — R401 Stupor: Secondary | ICD-10-CM

## 2017-06-05 DIAGNOSIS — I2510 Atherosclerotic heart disease of native coronary artery without angina pectoris: Secondary | ICD-10-CM | POA: Diagnosis not present

## 2017-06-05 DIAGNOSIS — I509 Heart failure, unspecified: Secondary | ICD-10-CM

## 2017-06-05 DIAGNOSIS — R402142 Coma scale, eyes open, spontaneous, at arrival to emergency department: Secondary | ICD-10-CM | POA: Diagnosis present

## 2017-06-05 DIAGNOSIS — R269 Unspecified abnormalities of gait and mobility: Secondary | ICD-10-CM | POA: Diagnosis present

## 2017-06-05 DIAGNOSIS — R0902 Hypoxemia: Secondary | ICD-10-CM | POA: Diagnosis not present

## 2017-06-05 DIAGNOSIS — H9190 Unspecified hearing loss, unspecified ear: Secondary | ICD-10-CM | POA: Diagnosis not present

## 2017-06-05 DIAGNOSIS — Z833 Family history of diabetes mellitus: Secondary | ICD-10-CM

## 2017-06-05 LAB — URINALYSIS, ROUTINE W REFLEX MICROSCOPIC
BACTERIA UA: NONE SEEN
Bilirubin Urine: NEGATIVE
Glucose, UA: >=500 mg/dL — AB
Ketones, ur: NEGATIVE mg/dL
Leukocytes, UA: NEGATIVE
Nitrite: NEGATIVE
Protein, ur: 30 mg/dL — AB
Specific Gravity, Urine: 1.016 (ref 1.005–1.030)
pH: 6 (ref 5.0–8.0)

## 2017-06-05 LAB — PROTIME-INR
INR: 1.09
PROTHROMBIN TIME: 14.1 s (ref 11.4–15.2)

## 2017-06-05 LAB — CBC
HCT: 42.4 % (ref 39.0–52.0)
Hemoglobin: 13.9 g/dL (ref 13.0–17.0)
MCH: 30.2 pg (ref 26.0–34.0)
MCHC: 32.8 g/dL (ref 30.0–36.0)
MCV: 92.2 fL (ref 78.0–100.0)
Platelets: 180 10*3/uL (ref 150–400)
RBC: 4.6 MIL/uL (ref 4.22–5.81)
RDW: 13.5 % (ref 11.5–15.5)
WBC: 6.2 10*3/uL (ref 4.0–10.5)

## 2017-06-05 LAB — COMPREHENSIVE METABOLIC PANEL
ALBUMIN: 3.5 g/dL (ref 3.5–5.0)
ALK PHOS: 75 U/L (ref 38–126)
ALT: 13 U/L — ABNORMAL LOW (ref 17–63)
ANION GAP: 11 (ref 5–15)
AST: 27 U/L (ref 15–41)
BILIRUBIN TOTAL: 0.9 mg/dL (ref 0.3–1.2)
BUN: 24 mg/dL — ABNORMAL HIGH (ref 6–20)
CALCIUM: 9 mg/dL (ref 8.9–10.3)
CO2: 23 mmol/L (ref 22–32)
Chloride: 105 mmol/L (ref 101–111)
Creatinine, Ser: 1.66 mg/dL — ABNORMAL HIGH (ref 0.61–1.24)
GFR, EST AFRICAN AMERICAN: 39 mL/min — AB (ref >60)
GFR, EST NON AFRICAN AMERICAN: 34 mL/min — AB (ref >60)
GLUCOSE: 325 mg/dL — AB (ref 65–99)
Potassium: 4.1 mmol/L (ref 3.5–5.1)
SODIUM: 139 mmol/L (ref 135–145)
TOTAL PROTEIN: 7.4 g/dL (ref 6.5–8.1)

## 2017-06-05 LAB — CBG MONITORING, ED
GLUCOSE-CAPILLARY: 282 mg/dL — AB (ref 65–99)
GLUCOSE-CAPILLARY: 320 mg/dL — AB (ref 65–99)
GLUCOSE-CAPILLARY: 319 mg/dL — AB (ref 65–99)

## 2017-06-05 LAB — I-STAT TROPONIN, ED: TROPONIN I, POC: 0.06 ng/mL (ref 0.00–0.08)

## 2017-06-05 LAB — APTT: APTT: 32 s (ref 24–36)

## 2017-06-05 MED ORDER — FENTANYL CITRATE (PF) 100 MCG/2ML IJ SOLN
50.0000 ug | Freq: Once | INTRAMUSCULAR | Status: AC
  Administered 2017-06-05: 50 ug via INTRAVENOUS
  Filled 2017-06-05: qty 2

## 2017-06-05 MED ORDER — INSULIN DETEMIR 100 UNIT/ML ~~LOC~~ SOLN
15.0000 [IU] | Freq: Two times a day (BID) | SUBCUTANEOUS | Status: DC
  Administered 2017-06-06 (×2): 15 [IU] via SUBCUTANEOUS
  Filled 2017-06-05 (×4): qty 0.15

## 2017-06-05 MED ORDER — SODIUM CHLORIDE 0.9% FLUSH
3.0000 mL | Freq: Two times a day (BID) | INTRAVENOUS | Status: DC
  Administered 2017-06-06 – 2017-06-08 (×4): 3 mL via INTRAVENOUS

## 2017-06-05 MED ORDER — SODIUM CHLORIDE 0.9 % IV BOLUS
500.0000 mL | Freq: Once | INTRAVENOUS | Status: AC
  Administered 2017-06-05: 500 mL via INTRAVENOUS

## 2017-06-05 MED ORDER — SODIUM CHLORIDE 0.9% FLUSH
3.0000 mL | INTRAVENOUS | Status: DC | PRN
  Administered 2017-06-06: 3 mL via INTRAVENOUS
  Filled 2017-06-05: qty 3

## 2017-06-05 MED ORDER — INSULIN ASPART 100 UNIT/ML ~~LOC~~ SOLN
0.0000 [IU] | SUBCUTANEOUS | Status: DC
  Administered 2017-06-05: 11 [IU] via SUBCUTANEOUS
  Administered 2017-06-06: 2 [IU] via SUBCUTANEOUS
  Administered 2017-06-06: 8 [IU] via SUBCUTANEOUS
  Administered 2017-06-06 (×3): 5 [IU] via SUBCUTANEOUS
  Administered 2017-06-06: 3 [IU] via SUBCUTANEOUS
  Administered 2017-06-07 – 2017-06-08 (×2): 5 [IU] via SUBCUTANEOUS
  Administered 2017-06-09: 8 [IU] via SUBCUTANEOUS
  Administered 2017-06-09: 5 [IU] via SUBCUTANEOUS
  Administered 2017-06-09: 3 [IU] via SUBCUTANEOUS
  Administered 2017-06-10: 2 [IU] via SUBCUTANEOUS
  Administered 2017-06-10: 5 [IU] via SUBCUTANEOUS
  Filled 2017-06-05: qty 1

## 2017-06-05 MED ORDER — METOPROLOL TARTRATE 5 MG/5ML IV SOLN
5.0000 mg | Freq: Four times a day (QID) | INTRAVENOUS | Status: DC
  Administered 2017-06-05 – 2017-06-06 (×2): 5 mg via INTRAVENOUS
  Filled 2017-06-05 (×3): qty 5

## 2017-06-05 MED ORDER — SODIUM CHLORIDE 0.9 % IV SOLN: 250.0000 mL | INTRAVENOUS | Status: DC | PRN

## 2017-06-05 MED ORDER — HYDRALAZINE HCL 20 MG/ML IJ SOLN: 10.0000 mg | INTRAMUSCULAR | Status: DC | PRN

## 2017-06-05 MED ORDER — ONDANSETRON HCL 4 MG/2ML IJ SOLN
4.0000 mg | Freq: Four times a day (QID) | INTRAMUSCULAR | Status: DC | PRN
  Administered 2017-06-05: 4 mg via INTRAVENOUS
  Filled 2017-06-05 (×2): qty 2

## 2017-06-05 MED ORDER — FUROSEMIDE 10 MG/ML IJ SOLN
80.0000 mg | Freq: Two times a day (BID) | INTRAMUSCULAR | Status: DC
  Administered 2017-06-05 – 2017-06-06 (×2): 80 mg via INTRAVENOUS
  Filled 2017-06-05 (×2): qty 8

## 2017-06-05 MED ORDER — TETANUS-DIPHTH-ACELL PERTUSSIS 5-2.5-18.5 LF-MCG/0.5 IM SUSP
0.5000 mL | Freq: Once | INTRAMUSCULAR | Status: AC
  Administered 2017-06-05: 0.5 mL via INTRAMUSCULAR
  Filled 2017-06-05: qty 0.5

## 2017-06-05 NOTE — ED Triage Notes (Signed)
Arrived via EMS patient from home. Report family member heard a thud found patient on floor with laceration posterior head 2 inch. CBG 290 on Plavix. GCS 10 per EMS Patient non verbal, moves all extremities and does not follow commands.

## 2017-06-05 NOTE — H&P (Signed)
[facility] History and Physical  Levi Robinson BJY:[id number] DOB: 04-27-1921 DOA: 06/05/2017  Referring physician: Dr Levi Robinson PCP: Levi Fick, MD   Chief Complaint: Fall with SDH  HPI: Levi Robinson is a 84 y.o. male with hx of CAD/ stent, DM2, HTN, MI who presents after a fall at home today.  Family member heard a thud. Brought to ED and eval per head CT showing a significant R SDH with R> L shift.  ED MD spoke with family who said that patient wanted no invasive procedures having previously denied carotid surgery.  They do now want to pursue surgical options.  Asked to see for medical admission.    Patient was obtunded but now is waking up some and answering simple questions.  Family states at baseline he is taking care of himself but does need some help with bathing but not with walking or eating.  Home meds include ecasa and plavix and BP meds. Only prior admit here was in 2013 for NSTEMI w CAD underwent PCI to LCx with stent, LVEF was ok, intermittent AmS due to meds, DM , carotid disease, anemia , WCM felt to be afib w/ aberrant conduction and HTN.      ROS  n/a   Past Medical History  Past Medical History:  Diagnosis Date  . Anemia    a. Felt to be iatrogenic from procedures/sticks 06/2011  . Arthritis   . CAD (coronary artery disease)    a. NSTEMI s/p complex PCI to prox Cx 07/03/11  . Carotid arterial disease (HCC)    a. By MRI 05/2011;  b. 06/2011 Carotid U/S: LICA 80-99%, RICA 100%, patent vertebrals.  . Diabetes mellitus   . Encephalopathy    AMS during 06/2011 hospitalization - MRI of brain without infarct but did show R carotid dz, abnl EEG with nonspecific encephalopathy, felt secondary to drugs  . Hypertension   . Myocardial infarction (HCC)   . Transient atrial fibrillation or flutter    Brief WCT during hospitalization 06/2011 felt to be afib with abberation, not a good coumadin candidate   Past Surgical History  Past Surgical History:   Procedure Laterality Date  . APPENDECTOMY  1948  . LEFT HEART CATHETERIZATION WITH CORONARY ANGIOGRAM N/A 07/01/2011   Procedure: LEFT HEART CATHETERIZATION WITH CORONARY ANGIOGRAM;  Surgeon: Levi Abraham, MD;  Location: [facility] CATH LAB;  Service: Cardiovascular;  Laterality: N/A;  . PERCUTANEOUS CORONARY STENT INTERVENTION (PCI-S) N/A 07/03/2011   Procedure: PERCUTANEOUS CORONARY STENT INTERVENTION (PCI-S);  Surgeon: Levi Abraham, MD;  Location: [facility] CATH LAB;  Service: Cardiovascular;  Laterality: N/A;  . thumb surgery  1975   left   Family History  Family History  Problem Relation Age of Onset  . Diabetes Mother   . Cancer Father   . Cancer Sister   . Cancer Brother   . Cancer Daughter    Social History  reports that he quit smoking about 31 years ago. His smoking use included cigarettes. He has never used smokeless tobacco. He reports that he does not drink alcohol or use drugs. Allergies  Allergies  Allergen Reactions  . Ativan [Lorazepam] Other (See Comments)    agitation   Home medications Prior to Admission medications   Medication Sig Start Date End Date Taking? Authorizing Provider  acetaminophen (TYLENOL) 325 MG tablet Take 650 mg by mouth every 6 (six) hours as needed (pain).     [provider]  aspirin 81 MG tablet Take 1 tablet (81 mg  total) by mouth daily. 07/04/11   Levi Robinson, Levi Ruiz, PA-C  carvedilol (COREG) 12.5 MG tablet Take 6.25 mg by mouth 2 (two) times daily with a meal.    [provider]  clopidogrel (PLAVIX) 75 MG tablet Take 1 tablet (75 mg total) by mouth daily. 05/24/13   Levi Hazel, MD  fish oil-omega-3 fatty acids 1000 MG capsule Take 1 g by mouth 3 (three) times daily.     [provider]  furosemide (LASIX) 40 MG tablet TAKE 1 TABLET BY MOUTH TWICE A DAY 04/24/17   Levi Hazel, MD  insulin detemir (LEVEMIR) 100 UNIT/ML injection Inject 36-40 Units into the skin 2 (two) times daily. 38 units with  breakfast and 12 units with dinner  07/04/11   Levi Robinson, Levi Ruiz, PA-C  isosorbide mononitrate (IMDUR) 30 MG 24 hr tablet Take 30 mg by mouth daily after lunch. 07/04/11   Levi Robinson, Levi Ruiz, PA-C  ketoconazole (NIZORAL) 2 % cream Apply 1 application topically as needed. For rash 07/28/11   [provider]  levothyroxine (SYNTHROID, LEVOTHROID) 50 MCG tablet Take 1 tablet (50 mcg total) by mouth daily. 06/22/14   Levi Hazel, MD  nitroGLYCERIN (NITROSTAT) 0.4 MG SL tablet Place 1 tablet (0.4 mg total) under the tongue every 5 (five) minutes x 3 doses as needed for chest pain. 07/04/11   Levi Montana, PA-C  ondansetron (ZOFRAN ODT) 4 MG disintegrating tablet  ODT q4 hours prn nausea/vomit 04/15/17   Levi Plan, DO  pantoprazole (PROTONIX) 40 MG tablet Take 40 mg by mouth daily after lunch.     [provider]  potassium chloride (K-DUR) 10 MEQ tablet Take 10 mEq by mouth daily.  11/07/13   [provider]  potassium chloride (K-DUR) 10 MEQ tablet Take 10 mEq by mouth as directed. Pt takes one and one-half a tablet daily    [provider]  simvastatin (ZOCOR) 40 MG tablet Take 20 mg by mouth daily. Pt takes 1/2 tablet at bedtime     [provider]  Specialty Vitamins Products (PROSTATE PO) Take 600 mg by mouth daily. Super beta prostate, 600 mg     [provider]   Liver Function Tests Recent Labs  Lab 06/05/17 1130  AST 27  ALT 13*  ALKPHOS 75  BILITOT 0.9  PROT 7.4  ALBUMIN 3.5   No results for input(s): LIPASE, AMYLASE in the last 168 hours. CBC Recent Labs  Lab 06/05/17 1130  WBC 6.2  HGB 13.9  HCT 42.4  MCV 92.2  PLT 180   Basic Metabolic Panel Recent Labs  Lab 06/05/17 1130  NA 139  K 4.1  CL 105  CO2 23  GLUCOSE 325*  BUN 24*  CREATININE 1.66*  CALCIUM 9.0     Vitals:   06/05/17 1230 06/05/17 1245 06/05/17 1315 06/05/17 1400  BP: (!) 184/104 (!) 169/151 (!) 187/94 (!) 158/86  Pulse: (!) 116 (!) 106 (!)  107 (!) 102  Resp: (!) 23  (!) 25 (!) 24  Temp:      TempSrc:      SpO2: (!) 86% 99% 95% 97%  Weight:      Height:       Exam: Gen opens eyes and follows some commands, not all, mumbles some C-collar in place No rash, cyanosis or gangrene Sclera anicteric, throat clear  No jvd or bruits Chest clear bilat no wheezing or rales RRR no MRG Abd soft ntnd no mass or ascites +  bs GU normal male MS no joint effusions or deformity Ext no LE or UE edema / no wounds or ulcers Neuro is lethargic but arousable, follows some commands moves all extremities, confused but responds occasionally verbally    Home meds: -detemir insulin 38 am and 12 pm -lasix 40 bid/ coreg 6.25 bid/ imdur 30 qd/ kdur 10 -ecasa 81/ plavix 75 -synthroid/ PPI/ zocor  EKG (independ reviewed) > afib with RVR HR 96 , RBBB CXR (independ reviewed) > diffuse IS widening c/w CHF BUN 24 Cr 1.6  Na 139 K 4.1  Alb 3.5  WBC 6k Hb 13 UA neg  Assessment: 1  Fall/ R subdural hematoma - 2 cm subdural with vent shift R > L. Altered mental status which is now seeming to improve some.   Family requests no invasive procedures. Family agrees to DNR status.  Supportive care, hold ASA and Plavix for now.  Admit telemetry bed.   2   Afib - not candidate for anticoag due to #1; IV metoprolol q 6 hrs low dose 3   HTN - prn IV hydralazine for now, hold po meds until more alert and can pass swallow exam 4  DM2 - uncontrolled , bid levemir low dose and SSI 5  CHF - patient w/ CHF by CXR today, not in distress, will start on IV lasix 6  Hypothyroid 7  AMS - due to #1 8  DNR     Robinson - as above       Levi Robinson D [facility] Pager [phone]   If 7PM-7AM, please contact night-coverage [email or web address] Password [facility] 06/05/2017, 2:55 PM

## 2017-06-05 NOTE — Progress Notes (Signed)
[name] requested something for pain. Will contact provider.

## 2017-06-05 NOTE — ED Notes (Signed)
Pt able to tell this RN his birth month and day as well as name.  GCS now 14

## 2017-06-05 NOTE — Progress Notes (Signed)
Notified patient having hematuria. Will notify service.

## 2017-06-05 NOTE — ED Provider Notes (Signed)
[facility] EMERGENCY DEPARTMENT Provider Note   CSN: [id number] Arrival date & time: 06/05/17  1120     History   Chief Complaint Chief Complaint  Patient presents with  . Altered Mental Status    HPI KARLON SCHLAFER is a 84 y.o. male.  HPI 84 year old male presents by EMS after unwitnessed fall.  Great-granddaughter at bedside.  States she heard patient fall around 10:30 AM this morning.  Patient is confused, not answering questions or follow commands.  Level 5 caveat.  Great granddaughter states patient is normally verbal and ambulatory.  Performs some of his activities of daily living.  Last seen at baseline mental status yesterday evening. Past Medical History:  Diagnosis Date  . Anemia    a. Felt to be iatrogenic from procedures/sticks 06/2011  . Arthritis   . CAD (coronary artery disease)    a. NSTEMI s/p complex PCI to prox Cx 07/03/11  . Carotid arterial disease (HCC)    a. By MRI 05/2011;  b. 06/2011 Carotid U/S: LICA 80-99%, RICA 100%, patent vertebrals.  . Diabetes mellitus   . Encephalopathy    AMS during 06/2011 hospitalization - MRI of brain without infarct but did show R carotid dz, abnl EEG with nonspecific encephalopathy, felt secondary to drugs  . Hypertension   . Myocardial infarction (HCC)   . Transient atrial fibrillation or flutter    Brief WCT during hospitalization 06/2011 felt to be afib with abberation, not a good coumadin candidate    Patient Active Problem List   Diagnosis Date Noted  . Atrial fibrillation with RVR (HCC) 06/05/2017  . Subdural hematoma, acute (HCC) 06/05/2017  . Altered mental status 06/05/2017  . Acute CHF (congestive heart failure) (HCC) 06/05/2017  . Fall 06/05/2017  . Uncontrolled diabetes mellitus (HCC) 06/05/2017  . DNR (do not resuscitate) 06/05/2017  . Subdural hematoma (HCC) 06/05/2017  . Occlusion and stenosis of carotid artery without mention of cerebral infarction 08/04/2011  . Hypertension   .  CAD (coronary artery disease)   . Carotid arterial disease (HCC)   . Anemia   . Posterior vitreous detachment 06/05/2011  . Status post intraocular lens implant 06/05/2011  . Branch retinal vein occlusion 12/12/2010  . Nonproliferative diabetic retinopathy (HCC) 12/12/2010    Past Surgical History:  Procedure Laterality Date  . APPENDECTOMY  1948  . LEFT HEART CATHETERIZATION WITH CORONARY ANGIOGRAM N/A 07/01/2011   Procedure: LEFT HEART CATHETERIZATION WITH CORONARY ANGIOGRAM;  Surgeon: Herby Abraham, MD;  Location: [facility] CATH LAB;  Service: Cardiovascular;  Laterality: N/A;  . PERCUTANEOUS CORONARY STENT INTERVENTION (PCI-S) N/A 07/03/2011   Procedure: PERCUTANEOUS CORONARY STENT INTERVENTION (PCI-S);  Surgeon: Herby Abraham, MD;  Location: [facility] CATH LAB;  Service: Cardiovascular;  Laterality: N/A;  . thumb surgery  1975   left        Home Medications    Prior to Admission medications   Medication Sig Start Date End Date Taking? Authorizing Provider  acetaminophen (TYLENOL) 325 MG tablet Take 650 mg by mouth every 6 (six) hours as needed (pain).     [provider]  aspirin 81 MG tablet Take 1 tablet (81 mg total) by mouth daily. 07/04/11   Dunn, Tacey Ruiz, PA-C  carvedilol (COREG) 12.5 MG tablet Take 6.25 mg by mouth 2 (two) times daily with a meal.    [provider]  clopidogrel (PLAVIX) 75 MG tablet Take 1 tablet (75 mg total) by mouth daily. 05/24/13   Kathleene Hazel, MD  fish oil-omega-3 fatty acids 1000 MG capsule Take 1 g by mouth 3 (three) times daily.     [provider]  furosemide (LASIX) 40 MG tablet TAKE 1 TABLET BY MOUTH TWICE A DAY 04/24/17   Kathleene Hazel, MD  insulin detemir (LEVEMIR) 100 UNIT/ML injection Inject 36-40 Units into the skin 2 (two) times daily. 38 units with breakfast and 12 units with dinner  07/04/11   Dunn, Tacey Ruiz, PA-C  isosorbide mononitrate (IMDUR) 30 MG 24 hr tablet Take 30 mg by mouth daily after  lunch. 07/04/11   Dunn, Tacey Ruiz, PA-C  ketoconazole (NIZORAL) 2 % cream Apply 1 application topically as needed. For rash 07/28/11   [provider]  levothyroxine (SYNTHROID, LEVOTHROID) 50 MCG tablet Take 1 tablet (50 mcg total) by mouth daily. 06/22/14   Kathleene Hazel, MD  nitroGLYCERIN (NITROSTAT) 0.4 MG SL tablet Place 1 tablet (0.4 mg total) under the tongue every 5 (five) minutes x 3 doses as needed for chest pain. 07/04/11   Laurann Montana, PA-C  ondansetron (ZOFRAN ODT) 4 MG disintegrating tablet  ODT q4 hours prn nausea/vomit 04/15/17   Melene Plan, DO  pantoprazole (PROTONIX) 40 MG tablet Take 40 mg by mouth daily after lunch.     [provider]  potassium chloride (K-DUR) 10 MEQ tablet Take 10 mEq by mouth daily.  11/07/13   [provider]  potassium chloride (K-DUR) 10 MEQ tablet Take 10 mEq by mouth as directed. Pt takes one and one-half a tablet daily    [provider]  simvastatin (ZOCOR) 40 MG tablet Take 20 mg by mouth daily. Pt takes 1/2 tablet at bedtime     [provider]  Specialty Vitamins Products (PROSTATE PO) Take 600 mg by mouth daily. Super beta prostate, 600 mg     [provider]    Family History Family History  Problem Relation Age of Onset  . Diabetes Mother   . Cancer Father   . Cancer Sister   . Cancer Brother   . Cancer Daughter     Social History Social History   Tobacco Use  . Smoking status: Former Smoker    Types: Cigarettes    Last attempt to quit: 01/13/1986    Years since quitting: 31.4  . Smokeless tobacco: Never Used  Substance Use Topics  . Alcohol use: No  . Drug use: No     Allergies   Ativan [lorazepam]   Review of Systems Review of Systems  Unable to perform ROS: Mental status change     Physical Exam Updated Vital Signs BP (!) 156/97   Pulse (!) 107   Temp 98 F (36.7 C) (Axillary)   Resp 19   Ht  (1.778 m)   Wt 72.6 kg (160 lb)   SpO2 (!) 84%    BMI 22.96 kg/m   Physical Exam  Constitutional: He appears well-developed and well-nourished. No distress.  HENT:  Head: Normocephalic.  Mouth/Throat: Oropharynx is clear and moist.  3 cm linear laceration to the posterior scalp.  No active bleeding.  Midface is stable.  Eyes: Pupils are equal, round, and reactive to light. EOM are normal.  Pupils 3 mm and sluggish.  Neck:  Cervical collar in place.  Cardiovascular: Regular rhythm.  Tachycardia.  Pulmonary/Chest: Effort normal and breath sounds normal.  Abdominal: Soft. Bowel sounds are normal. There is no tenderness. There is no rebound and no guarding.  Musculoskeletal: Normal range of motion. He  exhibits no edema or tenderness.  Neurological: He is alert.  Patient is agitated.  Flailing extremities.  Not following commands.  Appears to be moving all extremities without focal deficit.  Skin: Skin is warm and dry. Capillary refill takes less than 2 seconds. No rash noted. He is not diaphoretic. No erythema.  Nursing note and vitals reviewed.    ED Treatments / Results  Labs (all labs ordered are listed, but only abnormal results are displayed) Labs Reviewed  COMPREHENSIVE METABOLIC PANEL - Abnormal; Notable for the following components:      Result Value   Glucose, Bld 325 (*)    BUN 24 (*)    Creatinine, Ser 1.66 (*)    ALT 13 (*)    GFR calc non Af Amer 34 (*)    GFR calc Af Amer 39 (*)    All other components within normal limits  URINALYSIS, ROUTINE W REFLEX MICROSCOPIC - Abnormal; Notable for the following components:   Glucose, UA >=500 (*)    Hgb urine dipstick SMALL (*)    Protein, ur 30 (*)    All other components within normal limits  CBG MONITORING, ED - Abnormal; Notable for the following components:   Glucose-Capillary 282 (*)    All other components within normal limits  CBC  PROTIME-INR  APTT  I-STAT TROPONIN, ED    EKG EKG Interpretation  Date/Time:  Friday Jun 05 2017 11:41:05 EDT Ventricular  Rate:  96 PR Interval:    QRS Duration: 139 QT Interval:  394 QTC Calculation: 498 R Axis:   -77 Text Interpretation:  Atrial fibrillation Right bundle branch block LVH with IVCD and secondary repol abnrm Borderline prolonged QT interval Confirmed by Loren Racer ([id number]) on 06/05/2017 12:34:48 PM   Radiology Ct Head Wo Contrast  Result Date: 06/05/2017 CLINICAL DATA:  Altered level of consciousness, fell EXAM: CT HEAD WITHOUT CONTRAST TECHNIQUE: Contiguous axial images were obtained from the base of the skull through the vertex without intravenous contrast. COMPARISON:  12/19/2016 FINDINGS: Brain: Acute right mixed attenuation subdural hematoma measured up to 2.3cm thickness. This exerts mass effect upon the right cerebral hemisphere resulting in approximately 4 mm midline shift from right to left. There is mild compression of the right lateral ventricle. No hydronephrosis. There is moderate diffuse parenchymal atrophy. Patchy areas of hypoattenuation in deep inferior periventricular white matter bilaterally. No definite intraparenchymal hemorrhage. Acute infarct may be inapparent on noncontrast CT. Vascular: Atherosclerotic and physiologic intracranial calcifications. Skull: Normal. Negative for fracture or focal lesion. Sinuses/Orbits: No acute finding. Other: None IMPRESSION: 1. Large subdural hematoma over the right cerebral convexity resulting in mass effect and 4 mm midline shift. No hydrocephalus. Critical Value/emergent results were called by telephone at the time of interpretation on 06/05/2017 at 12:13 pm to Dr. Pricilla Loveless , who verbally acknowledged these results. Electronically Signed   By: Corlis Leak M.D.   On: 06/05/2017 12:14   Dg Chest Port 1 View  Result Date: 06/05/2017 CLINICAL DATA:  Hypoxia. EXAM: PORTABLE CHEST 1 VIEW COMPARISON:  With x-ray dated December 19, 2016. FINDINGS: Heart size remains at the upper limits of normal. New diffuse interstitial thickening. Bibasilar  atelectasis/scarring, not significantly changed. No significant abnormality identified. Fluid No acute osseous abnormality. IMPRESSION: New diffuse interstitial thickening, favor interstitial pulmonary edema. Electronically Signed   By: Obie Dredge M.D.   On: 06/05/2017 12:43    Procedures .Marland KitchenLaceration Repair Date/Time: 06/05/2017 3:30 PM Performed by: Loren Racer, MD Authorized by: Loren Racer, MD  Consent:    Consent given by:  Guardian Laceration details:    Location:  Scalp   Scalp location:  Occipital   Length (cm):  3 Repair type:    Repair type:  Simple Pre-procedure details:    Preparation:  Imaging obtained to evaluate for foreign bodies Exploration:    Contaminated: no   Treatment:    Amount of cleaning:  Standard   Visualized foreign bodies/material removed: no   Skin repair:    Repair method:  Staples   Number of staples:  3 Approximation:    Approximation:  Close Post-procedure details:    Dressing:  Open (no dressing)   Patient tolerance of procedure:  Tolerated well, no immediate complications   (including critical care time)  Medications Ordered in ED Medications  fentaNYL (SUBLIMAZE) injection 50 mcg (50 mcg Intravenous Given 06/05/17 1253)  sodium chloride 0.9 % bolus 500 mL (0 mLs Intravenous Stopped 06/05/17 1423)  Tdap (BOOSTRIX) injection 0.5 mL (0.5 mLs Intramuscular Given 06/05/17 1513)   CRITICAL CARE Performed by: Loren Racer Total critical care time: 45 minutes Critical care time was exclusive of separately billable procedures and treating other patients. Critical care was necessary to treat or prevent imminent or life-threatening deterioration. Critical care was time spent personally by me on the following activities: development of treatment plan with patient and/or surrogate as well as nursing, discussions with consultants, evaluation of patient's response to treatment, examination of patient, obtaining history from patient or  surrogate, ordering and performing treatments and interventions, ordering and review of laboratory studies, ordering and review of radiographic studies, pulse oximetry and re-evaluation of patient's condition.  Initial Impression / Assessment and Plan / ED Course  I have reviewed the triage vital signs and the nursing notes.  Pertinent labs & imaging results that were available during my care of the patient were reviewed by me and considered in my medical decision making (see chart for details).    Discussed with patient's granddaughter who is healthcare power of attorney.  States patient wanted no invasive procedures having previously denied carotid endarterectomy.  Does not want to pursue surgical options.  Discussed with Dr. Dutch Quint.  Agrees with admission for observation and hydration.    Final Clinical Impressions(s) / ED Diagnoses   Final diagnoses:  Subdural hematoma [facility])  Hypoxia    ED Discharge Orders    None       Loren Racer, MD 06/05/17 [phone]

## 2017-06-05 NOTE — ED Notes (Signed)
Pt vomited and was laid on side and suctioned.  NAD at this time SPO2 95% on 4L

## 2017-06-05 NOTE — Progress Notes (Signed)
Received repeat from [facility] in the ED. A little bit of confusion regarding the swallow test, thinking patient would be a neuro patient. At the end of our conversation, RN told me he received an order from the MD to put him on full liquid if he passed the swallow eval. At this point, a swallow screen has not been done, the nurse will do one. Awaiting patient.

## 2017-06-05 NOTE — Progress Notes (Signed)
Patient nauseous. Will page [facility] for order.

## 2017-06-05 NOTE — ED Notes (Signed)
EDP at bedside  

## 2017-06-06 LAB — BASIC METABOLIC PANEL
Anion gap: 10 (ref 5–15)
BUN: 19 mg/dL (ref 6–20)
CHLORIDE: 105 mmol/L (ref 101–111)
CO2: 26 mmol/L (ref 22–32)
CREATININE: 1.5 mg/dL — AB (ref 0.61–1.24)
Calcium: 9.2 mg/dL (ref 8.9–10.3)
GFR calc non Af Amer: 38 mL/min — ABNORMAL LOW (ref >60)
GFR, EST AFRICAN AMERICAN: 44 mL/min — AB (ref >60)
GLUCOSE: 198 mg/dL — AB (ref 65–99)
Potassium: 3.9 mmol/L (ref 3.5–5.1)
Sodium: 141 mmol/L (ref 135–145)

## 2017-06-06 LAB — CBC WITH DIFFERENTIAL/PLATELET
Abs Immature Granulocytes: 0 10*3/uL (ref 0.0–0.1)
Basophils Absolute: 0 10*3/uL (ref 0.0–0.1)
Basophils Relative: 0 %
EOS ABS: 0 10*3/uL (ref 0.0–0.7)
Eosinophils Relative: 0 %
HEMATOCRIT: 43 % (ref 39.0–52.0)
Hemoglobin: 14.1 g/dL (ref 13.0–17.0)
IMMATURE GRANULOCYTES: 0 %
LYMPHS ABS: 1.3 10*3/uL (ref 0.7–4.0)
Lymphocytes Relative: 15 %
MCH: 30.3 pg (ref 26.0–34.0)
MCHC: 32.8 g/dL (ref 30.0–36.0)
MCV: 92.3 fL (ref 78.0–100.0)
MONOS PCT: 10 %
Monocytes Absolute: 0.9 10*3/uL (ref 0.1–1.0)
NEUTROS PCT: 75 %
Neutro Abs: 6.4 10*3/uL (ref 1.7–7.7)
Platelets: 178 10*3/uL (ref 150–400)
RBC: 4.66 MIL/uL (ref 4.22–5.81)
RDW: 13.5 % (ref 11.5–15.5)
WBC: 8.6 10*3/uL (ref 4.0–10.5)

## 2017-06-06 LAB — GLUCOSE, CAPILLARY
GLUCOSE-CAPILLARY: 216 mg/dL — AB (ref 65–99)
GLUCOSE-CAPILLARY: 204 mg/dL — AB (ref 65–99)
GLUCOSE-CAPILLARY: 276 mg/dL — AB (ref 65–99)
Glucose-Capillary: 134 mg/dL — ABNORMAL HIGH (ref 65–99)
Glucose-Capillary: 159 mg/dL — ABNORMAL HIGH (ref 65–99)

## 2017-06-06 MED ORDER — FUROSEMIDE 40 MG PO TABS
40.0000 mg | ORAL_TABLET | Freq: Every day | ORAL | Status: DC
  Administered 2017-06-07 – 2017-06-10 (×4): 40 mg via ORAL
  Filled 2017-06-06 (×4): qty 1

## 2017-06-06 MED ORDER — ACETAMINOPHEN 325 MG PO TABS
650.0000 mg | ORAL_TABLET | Freq: Four times a day (QID) | ORAL | Status: DC | PRN
  Administered 2017-06-06 – 2017-06-08 (×5): 650 mg via ORAL
  Filled 2017-06-06 (×5): qty 2

## 2017-06-06 MED ORDER — CARVEDILOL 6.25 MG PO TABS
6.2500 mg | ORAL_TABLET | Freq: Two times a day (BID) | ORAL | Status: DC
  Administered 2017-06-06 – 2017-06-10 (×8): 6.25 mg via ORAL
  Filled 2017-06-06 (×9): qty 1

## 2017-06-06 MED ORDER — FENTANYL CITRATE (PF) 100 MCG/2ML IJ SOLN
50.0000 ug | Freq: Once | INTRAMUSCULAR | Status: AC
  Administered 2017-06-06: 50 ug via INTRAVENOUS
  Filled 2017-06-06: qty 2

## 2017-06-06 NOTE — Progress Notes (Signed)
Order received from provider, which was given. Suction set up with yankauer.

## 2017-06-06 NOTE — Progress Notes (Signed)
[facility] PROGRESS NOTE  Levi Robinson WUJ:[id number] DOB: 1921-08-10 DOA: 06/05/2017 PCP: Georgianne Fick, MD  Brief summary   84 y.o. male with hx of CAD/ stent, DM2, HTN, MI who presents after a fall at home today.  Family member heard a thud. Brought to ED and eval per head CT showing a significant R SDH with R> L shift.  ED MD spoke with family who said that patient wanted no invasive procedures having previously denied carotid surgery.  They do now want to pursue surgical options.  Asked to see for medical admission.    On admission: Patient was obtunded but now is waking up some and answering simple questions.  Family states at baseline he is taking care of himself but does need some help with bathing but not with walking or eating.  Home meds include ecasa and plavix and BP meds. Only prior admit here was in 2013 for NSTEMI w CAD underwent PCI to LCx with stent, LVEF was ok, intermittent AmS due to meds, DM , carotid disease, anemia , WCM felt to be afib w/ aberrant conduction and HTN.      Assessment/Plan:  Fall/ R subdural hematoma - 2 cm subdural with vent shift R > L. On admission: Altered mental status which is now seeming to improve some.   Family requests no invasive procedures. Family agrees to DNR status.  Supportive care, hold ASA and Plavix for now.     Afib - not candidate for anticoag due to #1; will transition to oral BB HTN . Resume oral meds.  DM2 - uncontrolled , per family: levemir 36-40 U per day. Will restart, +ISS as needed as able to eat  CHF. Mild congestion, but no distress, resume lasix Hypothyroid. Resume levothyroxine     Code Status: DNR Family Communication: d/w patient, his family  (indicate person spoken with, relationship, and if by phone, the number) Disposition Plan: will obtain pt/to    Consultants:  None   Procedures:  none  Antibiotics: Anti-infectives (From admission, onward)   None        (indicate start date,  and stop date if known)  HPI/Subjective: Alert. No acute distress. Confused. Per family: possible underlying memory issues at home.   Objective: Vitals:   06/06/17 0414 06/06/17 0748  BP: (!) 116/56 118/62  Pulse: 69   Resp: 19   Temp:  98.5 F (36.9 C)  SpO2: 96%     Intake/Output Summary (Last 24 hours) at 06/06/2017 0836 Last data filed at 06/06/2017 0749 Gross per 24 hour  Intake 500 ml  Output 1250 ml  Net -750 ml   Filed Weights   06/05/17 1135  Weight: 72.6 kg (160 lb)    Exam:   General:  No distress   Cardiovascular: s1,s2 rrr  Respiratory: CTA BL  Abdomen:  Soft, nt   Musculoskeletal: no leg edema    Data Reviewed: Basic Metabolic Panel: Recent Labs  Lab 06/05/17 1130 06/06/17 0235  NA 139 141  K 4.1 3.9  CL 105 105  CO2 23 26  GLUCOSE 325* 198*  BUN 24* 19  CREATININE 1.66* 1.50*  CALCIUM 9.0 9.2   Liver Function Tests: Recent Labs  Lab 06/05/17 1130  AST 27  ALT 13*  ALKPHOS 75  BILITOT 0.9  PROT 7.4  ALBUMIN 3.5   No results for input(s): LIPASE, AMYLASE in the last 168 hours. No results for input(s): AMMONIA in the last 168 hours. CBC: Recent Labs  Lab 06/05/17  1130 06/06/17 0235  WBC 6.2 8.6  NEUTROABS  --  6.4  HGB 13.9 14.1  HCT 42.4 43.0  MCV 92.2 92.3  PLT 180 178   Cardiac Enzymes: No results for input(s): CKTOTAL, CKMB, CKMBINDEX, TROPONINI in the last 168 hours. BNP (last 3 results) No results for input(s): BNP in the last 8760 hours.  ProBNP (last 3 results) No results for input(s): PROBNP in the last 8760 hours.  CBG: Recent Labs  Lab 06/05/17 1218 06/05/17 1817 06/05/17 1914 06/06/17 0020 06/06/17 0431  GLUCAP 282* 320* 319* 134* 216*    No results found for this or any previous visit (from the past 240 hour(s)).   Studies: Ct Head Wo Contrast  Result Date: 06/05/2017 CLINICAL DATA:  Altered level of consciousness, fell EXAM: CT HEAD WITHOUT CONTRAST TECHNIQUE: Contiguous axial images were  obtained from the base of the skull through the vertex without intravenous contrast. COMPARISON:  12/19/2016 FINDINGS: Brain: Acute right mixed attenuation subdural hematoma measured up to 2.3cm thickness. This exerts mass effect upon the right cerebral hemisphere resulting in approximately 4 mm midline shift from right to left. There is mild compression of the right lateral ventricle. No hydronephrosis. There is moderate diffuse parenchymal atrophy. Patchy areas of hypoattenuation in deep inferior periventricular white matter bilaterally. No definite intraparenchymal hemorrhage. Acute infarct may be inapparent on noncontrast CT. Vascular: Atherosclerotic and physiologic intracranial calcifications. Skull: Normal. Negative for fracture or focal lesion. Sinuses/Orbits: No acute finding. Other: None IMPRESSION: 1. Large subdural hematoma over the right cerebral convexity resulting in mass effect and 4 mm midline shift. No hydrocephalus. Critical Value/emergent results were called by telephone at the time of interpretation on 06/05/2017 at 12:13 pm to Dr. Pricilla Loveless , who verbally acknowledged these results. Electronically Signed   By: Corlis Leak M.D.   On: 06/05/2017 12:14   Dg Chest Port 1 View  Result Date: 06/05/2017 CLINICAL DATA:  Hypoxia. EXAM: PORTABLE CHEST 1 VIEW COMPARISON:  With x-ray dated December 19, 2016. FINDINGS: Heart size remains at the upper limits of normal. New diffuse interstitial thickening. Bibasilar atelectasis/scarring, not significantly changed. No significant abnormality identified. Fluid No acute osseous abnormality. IMPRESSION: New diffuse interstitial thickening, favor interstitial pulmonary edema. Electronically Signed   By: Obie Dredge M.D.   On: 06/05/2017 12:43    Scheduled Meds: . furosemide  80 mg Intravenous Q12H  . insulin aspart  0-15 Units Subcutaneous Q4H  . insulin detemir  15 Units Subcutaneous BID  . metoprolol tartrate  5 mg Intravenous Q6H  . sodium  chloride flush  3 mL Intravenous Q12H   Continuous Infusions: . sodium chloride      Principal Problem:   Subdural hematoma, acute (HCC) Active Problems:   Hypertension   Atrial fibrillation with RVR (HCC)   Altered mental status   Acute CHF (congestive heart failure) (HCC)   Fall   Uncontrolled diabetes mellitus (HCC)   DNR (do not resuscitate)   Subdural hematoma (HCC)    Time spent: >35 minutes     Esperanza Sheets  [facility] Pager [phone]. If 7PM-7AM, please contact night-coverage at [email or web address], password [email or web address] 06/06/2017, 8:36 AM  LOS: 1 day

## 2017-06-06 NOTE — Plan of Care (Signed)

## 2017-06-06 NOTE — Evaluation (Signed)
Clinical/Bedside Swallow Evaluation Patient Details  Name: Levi Robinson MRN: [id number] Date of Birth: [birth year]  Today's Date: 06/06/2017 Time: SLP Start Time (ACUTE ONLY): 0840 SLP Stop Time (ACUTE ONLY): 0900 SLP Time Calculation (min) (ACUTE ONLY): 20 min  Past Medical History:  Past Medical History:  Diagnosis Date  . Anemia    a. Felt to be iatrogenic from procedures/sticks 06/2011  . Arthritis   . CAD (coronary artery disease)    a. NSTEMI s/p complex PCI to prox Cx 07/03/11  . Carotid arterial disease (HCC)    a. By MRI 05/2011;  b. 06/2011 Carotid U/S: LICA 80-99%, RICA 100%, patent vertebrals.  . Diabetes mellitus   . Encephalopathy    AMS during 06/2011 hospitalization - MRI of brain without infarct but did show R carotid dz, abnl EEG with nonspecific encephalopathy, felt secondary to drugs  . Hypertension   . Myocardial infarction (HCC)   . Transient atrial fibrillation or flutter    Brief WCT during hospitalization 06/2011 felt to be afib with abberation, not a good coumadin candidate   Past Surgical History:  Past Surgical History:  Procedure Laterality Date  . APPENDECTOMY  1948  . LEFT HEART CATHETERIZATION WITH CORONARY ANGIOGRAM N/A 07/01/2011   Procedure: LEFT HEART CATHETERIZATION WITH CORONARY ANGIOGRAM;  Surgeon: Herby Abraham, MD;  Location: [facility] CATH LAB;  Service: Cardiovascular;  Laterality: N/A;  . PERCUTANEOUS CORONARY STENT INTERVENTION (PCI-S) N/A 07/03/2011   Procedure: PERCUTANEOUS CORONARY STENT INTERVENTION (PCI-S);  Surgeon: Herby Abraham, MD;  Location: [facility] CATH LAB;  Service: Cardiovascular;  Laterality: N/A;  . thumb surgery  1975   left   HPI:  Levi Robinson a 84 y.o.malewith hx of CAD/ stent, DM2, HTN, MI who presents after a fall at home. Family member heard a thud. Brought to ED and eval per head CT showing a significant R SDH with R>L shift. Altered mental status which is now seeming to improve some. Family requests no invasive  procedures. Swallow evaluation ordered.    Assessment / Plan / Recommendation Clinical Impression   Pt presents with mild risk for aspiration primarily due to cognitive impairment. Pt alert but confused; oriented to self. Follows limited commands with visual cues. No focal facial or lingual weakness noted. He is able to hold a cup and self-feed liquids via cup and straw. With initial cup sip, there is a single, immediate cough, however no further signs of aspiration with cup or straw sips as pt consumed 8 oz of thin liquids. Swallow appears timely. Pt unable to self feed puree when placed in front of him, however he is able to retrieve POs when handed a full spoon. No oral holding, no overt signs of aspiration. With regular solid, pt takes several bites, chewing and retaining solids throughout oral cavity, requiring verbal cues, several liquid washes to clear. Dentures are ill-fitting. Will initiate a conservative diet of dys 1 (puree) with thin liquids, straws OK, meds whole in puree. Granddaughter to bring denture adhesive from home. Prognosis for advancement of solids good as mentation improves and with denture adhesive.    SLP Visit Diagnosis: Dysphagia, unspecified (R13.10)    Aspiration Risk  Mild aspiration risk    Diet Recommendation Dysphagia 1 (Puree);Thin liquid   Liquid Administration via: Cup;Straw Medication Administration: Whole meds with puree Supervision: Full supervision/cueing for compensatory strategies Compensations: Minimize environmental distractions;Slow rate;Small sips/bites Postural Changes: Seated upright at 90 degrees    Other  Recommendations Oral Care Recommendations: Oral care BID  Follow up Recommendations Other (comment)(tba)      Frequency and Duration min 2x/week  2 weeks       Prognosis Prognosis for Safe Diet Advancement: Good Barriers to Reach Goals: Cognitive deficits      Swallow Study   General Date of Onset: 06/05/17 HPI: [name]  Robinson a 84 y.o.malewith hx of CAD/ stent, DM2, HTN, MI who presents after a fall at home. Family member heard a thud. Brought to ED and eval per head CT showing a significant R SDH with R>L shift. Altered mental status which is now seeming to improve some. Family requests no invasive procedures. Swallow evaluation ordered.  Type of Study: Bedside Swallow Evaluation Previous Swallow Assessment: none on file Diet Prior to this Study: NPO Temperature Spikes Noted: No Respiratory Status: Nasal cannula History of Recent Intubation: No Behavior/Cognition: Alert;Confused Oral Cavity Assessment: Dry Oral Care Completed by SLP: Yes Oral Cavity - Dentition: Dentures, top;Dentures, bottom Vision: Functional for self-feeding Self-Feeding Abilities: Needs assist Patient Positioning: Upright in bed Baseline Vocal Quality: Low vocal intensity Volitional Cough: Cognitively unable to elicit Volitional Swallow: Unable to elicit    Oral/Motor/Sensory Function Overall Oral Motor/Sensory Function: Other (comment)(limited assessment d/t cognition. no focal weakness apparent)   Ice Chips Ice chips: Within functional limits   Thin Liquid Thin Liquid: Within functional limits Presentation: Cup;Straw;Self Fed    Nectar Thick Nectar Thick Liquid: Not tested   Honey Thick Honey Thick Liquid: Not tested   Puree Puree: Within functional limits Presentation: Spoon(assistance required to self feed)   Solid   GO   Solid: Impaired Presentation: Self Fed Oral Phase Impairments: Impaired mastication;Poor awareness of bolus Oral Phase Functional Implications: Prolonged oral transit;Impaired mastication       Rondel Baton, [place], CCC-SLP Speech-Language Pathologist [phone]  Arlana Lindau 06/06/2017,9:22 AM

## 2017-06-06 NOTE — Progress Notes (Signed)
Page to dr Woodward Ku just fyi 3w35 bai,ber has had about 3 beats of vtach the monitor tech called me it could be prolonged QRS thanks

## 2017-06-07 ENCOUNTER — Inpatient Hospital Stay (HOSPITAL_COMMUNITY): Payer: Medicare Other

## 2017-06-07 LAB — GLUCOSE, CAPILLARY
GLUCOSE-CAPILLARY: 126 mg/dL — AB (ref 65–99)
GLUCOSE-CAPILLARY: 133 mg/dL — AB (ref 65–99)
GLUCOSE-CAPILLARY: 56 mg/dL — AB (ref 65–99)
GLUCOSE-CAPILLARY: 85 mg/dL (ref 65–99)
GLUCOSE-CAPILLARY: 88 mg/dL (ref 65–99)
Glucose-Capillary: 44 mg/dL — CL (ref 65–99)
Glucose-Capillary: 221 mg/dL — ABNORMAL HIGH (ref 65–99)
Glucose-Capillary: 231 mg/dL — ABNORMAL HIGH (ref 65–99)

## 2017-06-07 MED ORDER — DEXTROSE 50 % IV SOLN: 25.0000 mL | Freq: Once | INTRAVENOUS | Status: AC

## 2017-06-07 MED ORDER — INSULIN DETEMIR 100 UNIT/ML ~~LOC~~ SOLN
20.0000 [IU] | Freq: Every day | SUBCUTANEOUS | Status: DC
  Administered 2017-06-07 – 2017-06-09 (×3): 20 [IU] via SUBCUTANEOUS
  Filled 2017-06-07 (×4): qty 0.2

## 2017-06-07 MED ORDER — DEXTROSE 50 % IV SOLN
INTRAVENOUS | Status: AC
  Administered 2017-06-07: 50 mL
  Filled 2017-06-07: qty 50

## 2017-06-07 NOTE — Progress Notes (Signed)
[facility] PROGRESS NOTE  Levi Robinson ZOX:[id number] DOB: 1921/08/31 DOA: 06/05/2017 PCP: Georgianne Fick, MD  Brief summary   84 y.o. male with hx of CAD/ stent, DM2, HTN, MI who presents after a fall at home today.  Family member heard a thud. Brought to ED and eval per head CT showing a significant R SDH with R> L shift.  ED MD spoke with family who said that patient wanted no invasive procedures having previously denied carotid surgery.  They do now want to pursue surgical options.  Asked to see for medical admission.    On admission: Patient was obtunded but now is waking up some and answering simple questions.  Family states at baseline he is taking care of himself but does need some help with bathing but not with walking or eating.  Home meds include ecasa and plavix and BP meds. Only prior admit here was in 2013 for NSTEMI w CAD underwent PCI to LCx with stent, LVEF was ok, intermittent AmS due to meds, DM , carotid disease, anemia , WCM felt to be afib w/ aberrant conduction and HTN.      Assessment/Plan:  Fall/ R subdural hematoma - 2 cm subdural with vent shift R > L. On admission: Altered mental status which is now improved.  Family requests no invasive procedures. Family agrees to DNR status.  Supportive care, hold ASA and Plavix for now. Will need repeat ct in 1 week. Will obtain hip x ray due to fall. Obtain pt/ot to eval gait    Afib - not candidate for anticoag due to #1; will transition to oral BB HTN . Resume oral meds.  DM2 - uncontrolled-hyperglycemia on admission, per family: home regimen levemir 36-40 U per day. Noted mild hypoglycemia yesterday, poor oral intake. Decreased lantus to 20U QHS. +ISS as needed as able to eat  CHF. Mild congestion, but no distress, resumed lasix Hypothyroid. Resume levothyroxine     Code Status: DNR Family Communication: d/w patient, his family  (indicate person spoken with, relationship, and if by phone, the  number) Disposition Plan: will obtain pt/ot    Consultants:  None   Procedures:  none  Antibiotics: Anti-infectives (From admission, onward)   None       (indicate start date, and stop date if known)  HPI/Subjective: Alert. No acute distress. Confused. Per family: possible underlying memory issues at home.   Objective: Vitals:   06/07/17 0008 06/07/17 0506  BP: (!) 102/50 134/60  Pulse: 60 60  Resp: 18 18  Temp: 98.2 F (36.8 C)   SpO2: 99% 98%    Intake/Output Summary (Last 24 hours) at 06/07/2017 0803 Last data filed at 06/07/2017 0600 Gross per 24 hour  Intake 180 ml  Output 500 ml  Net -320 ml   Filed Weights   06/05/17 1135  Weight: 72.6 kg (160 lb)    Exam:   General:  No distress   Cardiovascular: s1,s2 rrr  Respiratory: CTA BL  Abdomen:  Soft, nt   Musculoskeletal: no leg edema    Data Reviewed: Basic Metabolic Panel: Recent Labs  Lab 06/05/17 1130 06/06/17 0235  NA 139 141  K 4.1 3.9  CL 105 105  CO2 23 26  GLUCOSE 325* 198*  BUN 24* 19  CREATININE 1.66* 1.50*  CALCIUM 9.0 9.2   Liver Function Tests: Recent Labs  Lab 06/05/17 1130  AST 27  ALT 13*  ALKPHOS 75  BILITOT 0.9  PROT 7.4  ALBUMIN 3.5  No results for input(s): LIPASE, AMYLASE in the last 168 hours. No results for input(s): AMMONIA in the last 168 hours. CBC: Recent Labs  Lab 06/05/17 1130 06/06/17 0235  WBC 6.2 8.6  NEUTROABS  --  6.4  HGB 13.9 14.1  HCT 42.4 43.0  MCV 92.2 92.3  PLT 180 178   Cardiac Enzymes: No results for input(s): CKTOTAL, CKMB, CKMBINDEX, TROPONINI in the last 168 hours. BNP (last 3 results) No results for input(s): BNP in the last 8760 hours.  ProBNP (last 3 results) No results for input(s): PROBNP in the last 8760 hours.  CBG: Recent Labs  Lab 06/06/17 2003 06/07/17 0006 06/07/17 0505 06/07/17 0621 06/07/17 0657  GLUCAP 159* 88 44* 56* 126*    No results found for this or any previous visit (from the past 240  hour(s)).   Studies: Ct Head Wo Contrast  Result Date: 06/05/2017 CLINICAL DATA:  Altered level of consciousness, fell EXAM: CT HEAD WITHOUT CONTRAST TECHNIQUE: Contiguous axial images were obtained from the base of the skull through the vertex without intravenous contrast. COMPARISON:  12/19/2016 FINDINGS: Brain: Acute right mixed attenuation subdural hematoma measured up to 2.3cm thickness. This exerts mass effect upon the right cerebral hemisphere resulting in approximately 4 mm midline shift from right to left. There is mild compression of the right lateral ventricle. No hydronephrosis. There is moderate diffuse parenchymal atrophy. Patchy areas of hypoattenuation in deep inferior periventricular white matter bilaterally. No definite intraparenchymal hemorrhage. Acute infarct may be inapparent on noncontrast CT. Vascular: Atherosclerotic and physiologic intracranial calcifications. Skull: Normal. Negative for fracture or focal lesion. Sinuses/Orbits: No acute finding. Other: None IMPRESSION: 1. Large subdural hematoma over the right cerebral convexity resulting in mass effect and 4 mm midline shift. No hydrocephalus. Critical Value/emergent results were called by telephone at the time of interpretation on 06/05/2017 at 12:13 pm to Dr. Pricilla Loveless , who verbally acknowledged these results. Electronically Signed   By: Corlis Leak M.D.   On: 06/05/2017 12:14   Dg Chest Port 1 View  Result Date: 06/05/2017 CLINICAL DATA:  Hypoxia. EXAM: PORTABLE CHEST 1 VIEW COMPARISON:  With x-ray dated December 19, 2016. FINDINGS: Heart size remains at the upper limits of normal. New diffuse interstitial thickening. Bibasilar atelectasis/scarring, not significantly changed. No significant abnormality identified. Fluid No acute osseous abnormality. IMPRESSION: New diffuse interstitial thickening, favor interstitial pulmonary edema. Electronically Signed   By: Obie Dredge M.D.   On: 06/05/2017 12:43    Scheduled  Meds: . carvedilol  6.25 mg Oral BID WC  . furosemide  40 mg Oral Daily  . insulin aspart  0-15 Units Subcutaneous Q4H  . insulin detemir  20 Units Subcutaneous QHS  . sodium chloride flush  3 mL Intravenous Q12H   Continuous Infusions: . sodium chloride      Principal Problem:   Subdural hematoma, acute (HCC) Active Problems:   Hypertension   Atrial fibrillation with RVR (HCC)   Altered mental status   Acute CHF (congestive heart failure) (HCC)   Fall   Uncontrolled diabetes mellitus (HCC)   DNR (do not resuscitate)   Subdural hematoma (HCC)    Time spent: >35 minutes     Esperanza Sheets  [facility] Pager [phone]. If 7PM-7AM, please contact night-coverage at [email or web address], password [facility] 06/07/2017, 8:03 AM  LOS: 2 days

## 2017-06-07 NOTE — Evaluation (Signed)
Physical Therapy Evaluation Patient Details Name: Levi Robinson MRN: [id number] DOB: Oct 10, 1921 Today's Date: 06/07/2017   History of Present Illness  Pt is a 84 y.o. male who presented to ED after a fall at home. CT revealing significant R SDH with R>L shift. Awaiting bilateral hip x-ray. PMH significant for history of CAD/stent, DM2, HTN, and MI.  Clinical Impression  Pt presents with the above diagnosis and below deficits for therapy evaluation. Due to cognitive status, evaluation was performed as a co-treat with OT. Pt was not able to participate much with therapy due to cognitive status and decreased alertness. Prior to admission, pt lived with his family in a single level home with a ramped entrance. Pt's granddaugther lives next door and pt has the ability to have 24 hour caregivers as needed. Pt is unable to perform much during this assessment, but is able to move himself back to bed and position on his left side at the completion of therapy assessment. As pt's mental status improves, hopefully he will be able to progress to standing and transfers to recliner and eventually perform gait.     Follow Up Recommendations SNF    Equipment Recommendations  None recommended by PT    Recommendations for Other Services       Precautions / Restrictions Precautions Precautions: Fall Restrictions Weight Bearing Restrictions: No      Mobility  Bed Mobility Overal bed mobility: Needs Assistance Bed Mobility: Supine to Sit;Sit to Supine     Supine to sit: Total assist Sit to supine: Supervision   General bed mobility comments: Total assist to come to EOB but able to lay back down with supervision after a few minutes.   Transfers                 General transfer comment: Did not test today. Pt attempting to lie down and awaiting bilateral hip x-ray and thus did not attempt.   Ambulation/Gait             General Gait Details: unable to attempt due to request for x-ray  for hips and pt cognitive status  Stairs            Wheelchair Mobility    Modified Rankin (Stroke Patients Only)       Balance Overall balance assessment: Needs assistance Sitting-balance support: No upper extremity supported;Feet supported Sitting balance-Leahy Scale: Poor Sitting balance - Comments: Mod assist to maintain balance at EOB.                                      Pertinent Vitals/Pain Pain Assessment: Faces Faces Pain Scale: No hurt Pain Location: pt unable to verbalize if he was in pain when asked    Home Living Family/patient expects to be discharged to:: Private residence Living Arrangements: Children;Other relatives Available Help at Discharge: Family;Personal care attendant;Available 24 hours/day;Other (Comment)(family around 80-90% of the time) Type of Home: House Home Access: Ramped entrance;Stairs to enter     Home Layout: One level Home Equipment: Walker - 4 wheels;Walker - 2 wheels;Wheelchair - manual;Grab bars - tub/shower;Tub bench Additional Comments: will have an aid 4-5 days a week    Prior Function Level of Independence: Needs assistance   Gait / Transfers Assistance Needed: walked with rollator throughout home, WC for longer distance  ADL's / Homemaking Assistance Needed: aide currently assisting 2-3 times per week but family in process of  increasing this to 4-5 times/week; pt requires assistance for all ADLS and IADLs from either aide or family member; does step into shower with assistance and use of grab bars        Hand Dominance        Extremity/Trunk Assessment   Upper Extremity Assessment Upper Extremity Assessment: Defer to OT evaluation RUE Deficits / Details: per family R UE is weker than L UE at baseline and noted decreased voluntary movement of RUE as compared to L. Did squeeze with 2/5 strength to command.  LUE Deficits / Details: Voluntary movement throughout functional ROM today. Minimal movement on  command.     Lower Extremity Assessment Lower Extremity Assessment: Generalized weakness;RLE deficits/detail;LLE deficits/detail RLE Deficits / Details: per family RLE is weaker at baseline LLE Deficits / Details: pt able to move LE's without assistance back into bed        Communication   Communication: Expressive difficulties  Cognition Arousal/Alertness: Lethargic Behavior During Therapy: WFL for tasks assessed/performed Overall Cognitive Status: Impaired/Different from baseline Area of Impairment: Attention;Following commands;Problem solving                   Current Attention Level: Focused(requires increased cues)   Following Commands: Follows one step commands inconsistently     Problem Solving: Slow processing;Difficulty sequencing;Decreased initiation;Requires verbal cues;Requires tactile cues General Comments: Pt lethargic on arrival and throughout session. He was able to open eyes briefly to command. He follows a few commands intermittently but does not follow the majority of commands. Per family, pt was conversant earlier today.       General Comments General comments (skin integrity, edema, etc.): Granddaughter in room on arrival.     Exercises     Assessment/Plan    PT Assessment Patient needs continued PT services  PT Problem List Decreased activity tolerance;Decreased balance;Decreased mobility;Decreased strength;Decreased cognition;Decreased knowledge of use of DME       PT Treatment Interventions DME instruction;Gait training;Functional mobility training;Therapeutic activities;Therapeutic exercise;Balance training;Patient/family education    PT Goals (Current goals can be found in the Care Plan section)  Acute Rehab PT Goals Patient Stated Goal: did not state PT Goal Formulation: With patient/family Time For Goal Achievement: 06/21/17 Potential to Achieve Goals: Fair    Frequency Min 4X/week   Barriers to discharge        Co-evaluation  PT/OT/SLP Co-Evaluation/Treatment: Yes Reason for Co-Treatment: Complexity of the patient's impairments (multi-system involvement);Necessary to address cognition/behavior during functional activity;For patient/therapist safety;To address functional/ADL transfers PT goals addressed during session: Mobility/safety with mobility         AM-PAC PT "6 Clicks" Daily Activity  Outcome Measure Difficulty turning over in bed (including adjusting bedclothes, sheets and blankets)?: Unable Difficulty moving from lying on back to sitting on the side of the bed? : Unable Difficulty sitting down on and standing up from a chair with arms (e.g., wheelchair, bedside commode, etc,.)?: Unable Help needed moving to and from a bed to chair (including a wheelchair)?: Total Help needed walking in hospital room?: Total Help needed climbing 3-5 steps with a railing? : Total 6 Click Score: 6    End of Session   Activity Tolerance: Patient limited by lethargy Patient left: in bed;with call bell/phone within reach;with family/visitor present Nurse Communication: Mobility status PT Visit Diagnosis: Other abnormalities of gait and mobility (R26.89);Muscle weakness (generalized) (M62.81);History of falling (Z91.81);Difficulty in walking, not elsewhere classified (R26.2)    Time: 1610-9604 PT Time Calculation (min) (ACUTE ONLY): 19 min   Charges:  PT Evaluation $PT Eval Moderate Complexity: 1 Mod     PT G Codes:        Colin Broach PT, DPT  [phone]   Ruel Favors Aletha Halim 06/07/2017, 12:09 PM

## 2017-06-07 NOTE — Progress Notes (Signed)
Pt's family request that bed alarm be turned off as they are staying in room with pt and do not want it to wake him up during the night.

## 2017-06-07 NOTE — Plan of Care (Signed)

## 2017-06-07 NOTE — Progress Notes (Signed)
Pt CBG =44, pt lethargic. 1/2 amp D50 administered per protocol, on call provider notified.

## 2017-06-07 NOTE — Evaluation (Signed)
Occupational Therapy Evaluation Patient Details Name: Levi Robinson MRN: [id number] DOB: 09-Aug-1921 Today's Date: 06/07/2017    History of Present Illness Pt is a 84 y.o. male who presented to ED after a fall at home. CT revealing significant R SDH with R>L shift. Awaiting bilateral hip x-ray. PMH significant for history of CAD/stent, DM2, HTN, and MI.   Clinical Impression   PTA, pt was ambulating with rollator or RW in his home without assistance and using W/C for longer distance mobility in the home. Pt has an aide as well as assistance from family for dressing and bathing tasks at baseline. Upon OT evaluation, pt presenting with increased lethargy and difficulty maintaining alert state for participation in ADL today. He is able to follow some simple commands but very inconsistent. He was able to answer some questions with "yes" or "no" today. Pt currently requiring total assistance for ADL participation due to cognitive deficits and lethargy today. He was; however, able to return to supine from sitting at EOB with overall supervision. Pt would benefit from continued OT services while admitted to improve independence and safety with ADL participation. At current functional level, recommend SNF level rehabilitation for OT follow-up. However, will continue to assess as pt more alert.     Follow Up Recommendations  SNF;Supervision/Assistance - 24 hour    Equipment Recommendations  Other (comment)(TBD at next venue of care)    Recommendations for Other Services       Precautions / Restrictions Precautions Precautions: Fall Restrictions Weight Bearing Restrictions: No      Mobility Bed Mobility Overal bed mobility: Needs Assistance Bed Mobility: Supine to Sit;Sit to Supine     Supine to sit: Total assist Sit to supine: Supervision   General bed mobility comments: Total assist to come to EOB but able to lay back down with supervision after a few minutes.   Transfers                  General transfer comment: Did not test today. Pt attempting to lie down and awaiting bilateral hip x-ray and thus did not attempt.     Balance Overall balance assessment: Needs assistance Sitting-balance support: No upper extremity supported;Feet supported Sitting balance-Leahy Scale: Poor Sitting balance - Comments: Mod assist to maintain balance at EOB.                                    ADL either performed or assessed with clinical judgement   ADL Overall ADL's : Needs assistance/impaired                                       General ADL Comments: Pt currently requiring total assistance for ADL participation. He would likely demonstrate improved participation once more alert. He was able to complete sit to supine without assistance but completes minimal movement on command.      Vision Patient Visual Report: No change from baseline Vision Assessment?: Vision impaired- to be further tested in functional context Additional Comments: Will continue to assess. Pt unable to maintain eyes open long enough to complete assessment. He did make eye contact once seated at EOB.      Perception     Praxis      Pertinent Vitals/Pain Pain Assessment: Faces Faces Pain Scale: No hurt Pain Location: pt unable to  verbalize if he was in pain when asked     Hand Dominance     Extremity/Trunk Assessment Upper Extremity Assessment Upper Extremity Assessment: Generalized weakness;RUE deficits/detail;LUE deficits/detail RUE Deficits / Details: per family R UE is weker than L UE at baseline and noted decreased voluntary movement of RUE as compared to L. Did squeeze with 2/5 strength to command.  LUE Deficits / Details: Voluntary movement throughout functional ROM today. Minimal movement on command.    Lower Extremity Assessment Lower Extremity Assessment: Defer to PT evaluation       Communication Communication Communication: Expressive  difficulties   Cognition Arousal/Alertness: Lethargic Behavior During Therapy: WFL for tasks assessed/performed Overall Cognitive Status: Impaired/Different from baseline Area of Impairment: Attention;Following commands;Problem solving                   Current Attention Level: Focused(requiring increased cues)   Following Commands: Follows one step commands inconsistently     Problem Solving: Slow processing;Difficulty sequencing;Decreased initiation;Requires verbal cues;Requires tactile cues General Comments: Pt lethargic on arrival and throughout session. He was able to open eyes briefly to command. He follows a few commands intermittently but does not follow the majority of commands. Per family, pt was conversant earlier today.    General Comments  Granddaughter in room on arrival.     Exercises     Shoulder Instructions      Home Living Family/patient expects to be discharged to:: Private residence Living Arrangements: Children;Other relatives Available Help at Discharge: Family;Personal care attendant;Available 24 hours/day;Other (Comment)(family around 80-90% of the time) Type of Home: House Home Access: Ramped entrance;Stairs to enter     Home Layout: One level     Bathroom Shower/Tub: Chief Strategy Officer: Standard     Home Equipment: Environmental consultant - 4 wheels;Walker - 2 wheels;Wheelchair - manual;Grab bars - tub/shower;Tub bench   Additional Comments: will have an aid 4-5 days a week      Prior Functioning/Environment Level of Independence: Needs assistance  Gait / Transfers Assistance Needed: walked with rollator throughout home, WC for longer distance ADL's / Homemaking Assistance Needed: aide currently assisting 2-3 times per week but family in process of increasing this to 4-5 times/week; pt requires assistance for all ADLS and IADLs from either aide or family member; does step into shower with assistance and use of grab bars             OT Problem List: Decreased strength;Decreased range of motion;Decreased activity tolerance;Impaired balance (sitting and/or standing);Decreased safety awareness;Decreased knowledge of use of DME or AE;Decreased knowledge of precautions;Pain      OT Treatment/Interventions: Self-care/ADL training;Therapeutic exercise;Energy conservation;DME and/or AE instruction;Therapeutic activities;Patient/family education;Balance training    OT Goals(Current goals can be found in the care plan section) Acute Rehab OT Goals Patient Stated Goal: did not state OT Goal Formulation: With family(pt unable to participate) Time For Goal Achievement: 06/21/17 Potential to Achieve Goals: Good ADL Goals Pt Will Perform Grooming: sitting;with set-up Pt Will Transfer to Toilet: ambulating;with min assist;regular height toilet Pt Will Perform Toileting - Clothing Manipulation and hygiene: with supervision;sit to/from stand Additional ADL Goal #1: Pt will follow 3/4 one step commands during seated grooming tasks. Additional ADL Goal #2: Pt will complete bed mobility with overall supervision to sit at EOB in preparation for ADL participation.  OT Frequency: Min 2X/week   Barriers to D/C:            Co-evaluation PT/OT/SLP Co-Evaluation/Treatment: Yes Reason for Co-Treatment: Complexity of the  patient's impairments (multi-system involvement);Necessary to address cognition/behavior during functional activity;For patient/therapist safety;To address functional/ADL transfers          AM-PAC PT "6 Clicks" Daily Activity     Outcome Measure Help from another person eating meals?: Total Help from another person taking care of personal grooming?: Total Help from another person toileting, which includes using toliet, bedpan, or urinal?: Total Help from another person bathing (including washing, rinsing, drying)?: Total Help from another person to put on and taking off regular upper body clothing?: Total Help from  another person to put on and taking off regular lower body clothing?: Total 6 Click Score: 6   End of Session Equipment Utilized During Treatment: Rolling walker;Gait belt Nurse Communication: Mobility status  Activity Tolerance: Patient tolerated treatment well Patient left: in chair;with call bell/phone within reach  OT Visit Diagnosis: Other abnormalities of gait and mobility (R26.89);Muscle weakness (generalized) (M62.81);Other symptoms and signs involving cognitive function                Time: 1610-9604 OT Time Calculation (min): 19 min Charges:  OT General Charges $OT Visit: 1 Visit OT Evaluation $OT Eval Moderate Complexity: 1 Mod G-Codes:     Doristine Section, MS OTR/L  Pager: [phone]   Yuritzi Kamp A Purnell Daigle 06/07/2017, 11:56 AM

## 2017-06-08 ENCOUNTER — Inpatient Hospital Stay (HOSPITAL_COMMUNITY): Payer: Medicare Other

## 2017-06-08 LAB — GLUCOSE, CAPILLARY
GLUCOSE-CAPILLARY: 281 mg/dL — AB (ref 65–99)
Glucose-Capillary: 123 mg/dL — ABNORMAL HIGH (ref 65–99)
Glucose-Capillary: 168 mg/dL — ABNORMAL HIGH (ref 65–99)
Glucose-Capillary: 185 mg/dL — ABNORMAL HIGH (ref 65–99)
Glucose-Capillary: 200 mg/dL — ABNORMAL HIGH (ref 65–99)
Glucose-Capillary: 209 mg/dL — ABNORMAL HIGH (ref 65–99)
Glucose-Capillary: 225 mg/dL — ABNORMAL HIGH (ref 65–99)

## 2017-06-08 LAB — HEMOGLOBIN A1C
HEMOGLOBIN A1C: 9.4 % — AB (ref 4.8–5.6)
MEAN PLASMA GLUCOSE: 223.08 mg/dL

## 2017-06-08 MED ORDER — DIPHENHYDRAMINE HCL 12.5 MG/5ML PO ELIX
12.5000 mg | ORAL_SOLUTION | Freq: Once | ORAL | Status: AC
  Administered 2017-06-08: 12.5 mg via ORAL
  Filled 2017-06-08: qty 10

## 2017-06-08 NOTE — Care Management Important Message (Signed)
Important Message  Patient Details  Name: Levi Robinson MRN: [id number] Date of Birth: 1921-03-28   Medicare Important Message Given:  Yes    Oralia Rud Fabrizio Filip 06/08/2017, 12:45 PM

## 2017-06-08 NOTE — Clinical Social Work Note (Signed)
Clinical Social Work Assessment  Patient Details  Name: Levi Robinson MRN: [id number] Date of Birth: 06-19-1921  Date of referral:  06/08/17               Reason for consult:  Facility Placement                Permission sought to share information with:  Family Supports Permission granted to share information::  Yes, Release of Information Signed  Name::     Magazine features editor::     Relationship::  Lawyer Information:     Housing/Transportation Living arrangements for the past 2 months:  Single Family Home Source of Information:  Other (Comment Required)(Grandaughter) Patient Interpreter Needed:  None Criminal Activity/Legal Involvement Pertinent to Current Situation/Hospitalization:  No - Comment as needed Significant Relationships:  Other Family Members Lives with:  Self Do you feel safe going back to the place where you live?    Need for family participation in patient care:  Yes (Comment)  Care giving concerns:  Pt is only alert to self. Pt's Granddaughter present at bedside. Pt's Granddaughter explains pt lives next door to her and states her daughter is always over there with him. Pt's Granddaughter also stated pt has a Charity fundraiser that comes in (a service with the [place]).   Social Worker assessment / plan:  CSW spoke with pt's Granddaughter at the bedside. Pt presented the recommendation of SNF at d/c. Pt's Granddaughter states she only wants pt to go to CIR or she will take him home. Pt's Granddaughter was appreciative and engaged in conversation. CSW explained PT's recommendation and explained CIR was not recommended because the pt would not qualify. Pt's Granddaughter states she is going to call the [place] tomorrow to determine what services the pt can get in order for him to return home at d/c. CSW to follow up with pt's Granddaughter tomorrow.  Employment status:  Retired Database administrator PT Recommendations:  Skilled Nursing Facility Information /  Referral to community resources:  Skilled Nursing Facility  Patient/Family's Response to care:  Pt's Granddaughter verbalized understanding of CSW role and expressed appreciation for support. Pt's Granddaughter denies any concern regarding pt care at this time. Pt's Granddaughter does not want pt to go to SNF at d/c. Pt's Granddaughter is going to call the [place] tomorrow to determine what services the pt could get to return home.   Patient/Family's Understanding of and Emotional Response to Diagnosis, Current Treatment, and Prognosis:  Pt Granddaughter is understanding of pt's physical limitations and states pt is going to work with PT again to evaluate his ability because he hasn't worked with them from a few days. Pt's Granddaughter is refusing SNF on behalf the pt (pt is only alert to self). Pt's Granddaughter states pt has plenty of family support and can have supervision for a majority of the time. Pt's Granddaughter declined any further questions or concerns.   Emotional Assessment Appearance:  Appears stated age Attitude/Demeanor/Rapport:  Unable to Assess Affect (typically observed):  Unable to Assess Orientation:  Oriented to Self Alcohol / Substance use:  Not Applicable Psych involvement (Current and /or in the community):  No (Comment)  Discharge Needs  Concerns to be addressed:  Basic Needs, Care Coordination, Discharge Planning Concerns, Patient refuses services Readmission within the last 30 days:  No Current discharge risk:  Dependent with Mobility, Lives alone, Physical Impairment Barriers to Discharge:  Continued Medical Work up   [employer], LCSW 06/08/2017,  12:34 PM

## 2017-06-08 NOTE — Clinical Social Work Note (Signed)
CSW spoke with pt's daughter at bedside. Pt's daughter is not interested in SNF however states she would like for pt to go to CIR. CSW explained that PT and MD would have already made that recommendation. Pt's daughter presistant. Per pt's daughter pt is service connected and pt's daughter states she is going to call about resources tomorrow in order to take the pt home. CSW to follow up with Granddaughter tomorrow.  [place], [place] [phone]

## 2017-06-08 NOTE — Progress Notes (Signed)
Pt CBG 200 this evening. Off going RN had reported that pt's grand-daughter had refused for pt to get sliding scale insulin this am with CBG of 123, but did allow pt to receive s/s with noon CBG of 225. Pt's son present at this time and he called grand-daughter to discuss pt getting s/s novolog at this time for a CBG of 200.  Explained to son and grand-daughter that he received 5 units of novolog insulin for the 225 and pt only at 200 now, coverage now would help get the 200 down. Family still refused. Pt orientation to self, family, DOB, and in hospital.  Pt did not have any input to what he wanted when asked, pt took other po med ordered.

## 2017-06-08 NOTE — Progress Notes (Signed)
PROGRESS NOTE  JSON KOELZER ZOX:[id number] DOB: 1922-01-12 DOA: 06/05/2017 PCP: Georgianne Fick, MD  HPI/Recap of past 24 hours: 84 y.o.malewith hx of CAD/ stent, DM2, HTN, MI who presents after a fall at home today. Family member heard a thud. Brought to ED and eval per head CT showing a significant R SDH with R>L shift. ED MD spoke with family who said that patient wanted no invasive procedures having previously denied carotid surgery. They do now want to pursue surgical options. Asked to see for medical admission.   On admission: Patient was obtunded but now is waking up some and answering simple questions. Family states at baseline he is taking care of himself but does need some help with bathing but not with walking or eating. Home meds include ecasa and plavix and BP meds. Only prior admit here was in 2013 for NSTEMI w CAD underwent PCI to LCx with stent, LVEF was ok, intermittent AmS due to meds, DM , carotid disease, anemia , WCM felt to be afib w/ aberrant conduction and HTN.   06/08/2017: Patient seen and examined at his bedside.  He is alert however he does not answer questions appropriately.  Currently seen in the room reports agitation overnight.  CT head with no contrast ordered to reassess subdural hematoma.  Assessment/Plan: Principal Problem:   Subdural hematoma, acute (HCC) Active Problems:   Hypertension   Atrial fibrillation with RVR (HCC)   Altered mental status   Acute CHF (congestive heart failure) (HCC)   Fall   Uncontrolled diabetes mellitus (HCC)   DNR (do not resuscitate)   Subdural hematoma (HCC)  Fall/right subdural hematoma with midline shift Continue to hold aspirin and Plavix Altered mental status reported CT head with no contrast ordered this morning 06/08/2017.  Personally reviewed and revealed mild decrease in the size of the right subdural hematoma and no evidence of new intracranial hemorrhage. Small posttraumatic subdural hygroma  noted Right hip x-ray unremarkable for any acute findings   Acute metabolic encephalopathy most likely multifactorial secondary to subdural hematoma versus others Reorient as needed Fall precautions  Ambulatory dysfunction PT evaluated and recommended SNF Patient is from home grandniece is his caregiver Case worker consulted for recommendations Fall precautions  Chronic A. fib Rate controlled Not a candidate for anticoagulation due to subdural hematoma  Hypertension Blood pressure stable Continue antihypertensive medications  Type 2 diabetes Hemoglobin A1c 9.4 on 06/08/2017 Continue insulin sliding scale Avoid hypoglycemia  Chronic diastolic CHF Last 2D echo done on 06/29/2011 revealed LVEF 65 to 70% with grade 1 diastolic dysfunction Continue cardiac meds Lasix Coreg Continue strict I's and O's Daily weight  CKD 3 Baseline creatinine 1.5 Avoid nephrotoxic agents/hypotension GFR 44 Repeat BMP in the morning   Code Status: DNR  Family Communication: Grand niece at bedside  Disposition Plan: SNF when clinically stable   Consultants:  None  Procedures:  None  Antimicrobials:  None  DVT prophylaxis: SCDs   Objective: Vitals:   06/07/17 1634 06/07/17 2018 06/08/17 0000 06/08/17 0759  BP: (!) 105/49 (!) 99/58 (!) 134/57 (!) 132/46  Pulse: 65 67 72 (!) 57  Resp: Temp: 98.3 F (36.8 C) 98.3 F (36.8 C) 99.4 F (37.4 C) 98.2 F (36.8 C)  TempSrc: Oral Oral Oral Oral  SpO2: 98% 99% 94% 99%  Weight:      Height:        Intake/Output Summary (Last 24 hours) at 06/08/2017 1043 Last data filed at 06/08/2017 [phone]  Gross per 24 hour  Intake 440 ml  Output 450 ml  Net -10 ml   Filed Weights   06/05/17 1135  Weight: 72.6 kg (160 lb)    Exam:  . General: 84 y.o. year-old male well developed well nourished in no acute distress.  Alert. . Cardiovascular: irregular rate and rhythm with no rubs or gallops.  No thyromegaly or JVD noted.     Marland Kitchen Respiratory: Clear to auscultation with no wheezes or rales. Good inspiratory effort. . Abdomen: Soft nontender nondistended with normal bowel sounds x4 quadrants. . Musculoskeletal: No lower extremity edema. 2/4 pulses in all 4 extremities.  Scar on right side of back of the head . Skin: Stated above . Psychiatry: Unable to assess due to altered mental status.   Data Reviewed: CBC: Recent Labs  Lab 06/05/17 1130 06/06/17 0235  WBC 6.2 8.6  NEUTROABS  --  6.4  HGB 13.9 14.1  HCT 42.4 43.0  MCV 92.2 92.3  PLT 180 178   Basic Metabolic Panel: Recent Labs  Lab 06/05/17 1130 06/06/17 0235  NA 139 141  K 4.1 3.9  CL 105 105  CO2 23 26  GLUCOSE 325* 198*  BUN 24* 19  CREATININE 1.66* 1.50*  CALCIUM 9.0 9.2   GFR: Estimated Creatinine Clearance: 31.6 mL/min (A) (by C-G formula based on SCr of 1.5 mg/dL (H)). Liver Function Tests: Recent Labs  Lab 06/05/17 1130  AST 27  ALT 13*  ALKPHOS 75  BILITOT 0.9  PROT 7.4  ALBUMIN 3.5   No results for input(s): LIPASE, AMYLASE in the last 168 hours. No results for input(s): AMMONIA in the last 168 hours. Coagulation Profile: Recent Labs  Lab 06/05/17 1130  INR 1.09   Cardiac Enzymes: No results for input(s): CKTOTAL, CKMB, CKMBINDEX, TROPONINI in the last 168 hours. BNP (last 3 results) No results for input(s): PROBNP in the last 8760 hours. HbA1C: Recent Labs    06/08/17 0915  HGBA1C 9.4*   CBG: Recent Labs  Lab 06/07/17 1720 06/07/17 2018 06/08/17 0007 06/08/17 0359 06/08/17 0740  GLUCAP 221* 231* 185* 168* 123*   Lipid Profile: No results for input(s): CHOL, HDL, LDLCALC, TRIG, CHOLHDL, LDLDIRECT in the last 72 hours. Thyroid Function Tests: No results for input(s): TSH, T4TOTAL, FREET4, T3FREE, THYROIDAB in the last 72 hours. Anemia Panel: No results for input(s): VITAMINB12, FOLATE, FERRITIN, TIBC, IRON, RETICCTPCT in the last 72 hours. Urine analysis:    Component Value Date/Time    COLORURINE YELLOW 06/05/2017 1222   APPEARANCEUR CLEAR 06/05/2017 1222   LABSPEC 1.016 06/05/2017 1222   PHURINE 6.0 06/05/2017 1222   GLUCOSEU >=500 (A) 06/05/2017 1222   HGBUR SMALL (A) 06/05/2017 1222   BILIRUBINUR NEGATIVE 06/05/2017 1222   KETONESUR NEGATIVE 06/05/2017 1222   PROTEINUR 30 (A) 06/05/2017 1222   UROBILINOGEN 0.2 06/08/2013 0424   NITRITE NEGATIVE 06/05/2017 1222   LEUKOCYTESUR NEGATIVE 06/05/2017 1222   Sepsis Labs: (procalcitonin:4,lacticidven:4)  )No results found for this or any previous visit (from the past 240 hour(s)).    Studies: Ct Head Wo Contrast  Result Date: 06/08/2017 CLINICAL DATA:  Follow-up of intracranial hemorrhage. EXAM: CT HEAD WITHOUT CONTRAST TECHNIQUE: Contiguous axial images were obtained from the base of the skull through the vertex without intravenous contrast. COMPARISON:  06/05/2017 FINDINGS: Brain: The large right-sided subdural hematoma has mildly decreased in size from the prior exam, more accurately measured on the current study due to less motion artifact. The hemorrhage measures approximately 1.7 cm in greatest  thickness. It creates mass effect with flattening of underlying gyri and partial sulcal effacement, and 4 mm of left midline shift. There is partial effacement of the right lateral ventricle. There has been a mild decrease in density in the subdural hemorrhage consistent with the expected normal evolution. There is no evidence of new intracranial hemorrhage. There is widening of the extra-axial space on the left, which is new since the previous examination. This measures 6-7 mm in thickness. No evidence of an ischemic infarct. There is generalized ventricular enlargement consistent with age-appropriate volume loss. No parenchymal masses. Vascular: No hyperdense vessel or unexpected calcification. Skull: Normal. Negative for fracture or focal lesion. Sinuses/Orbits: No acute finding. Other: None. IMPRESSION: 1. Mild decrease  in the size of the right subdural hemorrhage with an overall decrease in density consistent with the expected normal evolution. 2. No evidence of new intracranial hemorrhage.  No skin infarct. 3. Widened left-sided extra-axial space suggesting a small posttraumatic subdural hygroma, new since prior study. Electronically Signed   By: Amie Portland M.D.   On: 06/08/2017 09:11    Scheduled Meds: . carvedilol  6.25 mg Oral BID WC  . furosemide  40 mg Oral Daily  . insulin aspart  0-15 Units Subcutaneous Q4H  . insulin detemir  20 Units Subcutaneous QHS  . sodium chloride flush  3 mL Intravenous Q12H    Continuous Infusions: . sodium chloride       LOS: 3 days     Darlin Drop, MD [facility] Pager [phone]  If 7PM-7AM, please contact night-coverage [email or web address] Password [email or web address] 06/08/2017, 10:43 AM

## 2017-06-08 NOTE — Progress Notes (Signed)
Pt BG was 209 Daughter wants him to just have the levemir because Novolog has dropped him low before per Daughter.

## 2017-06-08 NOTE — Progress Notes (Signed)
Physical Therapy Treatment Patient Details Name: Levi Robinson MRN: [id number] DOB: [birth year] Today's Date: 06/08/2017    History of Present Illness Pt is a 84 y.o. male who presented to ED after a fall at home. CT revealing significant R SDH with R>L shift. Awaiting bilateral hip x-ray. PMH significant for history of CAD/stent, DM2, HTN, and MI.    PT Comments    Patient was alert during session however disoriented to place, time, and situation. Pt required min guard for bed mobility and min/mod A for transfers and gait. Family present and supportive. Recommend CIR level therapies for further skilled PT services to maximize independence and safety with mobility.    Follow Up Recommendations  CIR     Equipment Recommendations  None recommended by PT    Recommendations for Other Services       Precautions / Restrictions Precautions Precautions: Fall    Mobility  Bed Mobility Overal bed mobility: Needs Assistance Bed Mobility: Supine to Sit     Supine to sit: Min guard     General bed mobility comments: min guard for safety; use of rail and increased time/effort  Transfers Overall transfer level: Needs assistance Equipment used: Rolling walker (2 wheeled) Transfers: Sit to/from Stand Sit to Stand: Min assist         General transfer comment: assist to power up and to gain balance; cues for safe hand placement  Ambulation/Gait Ambulation/Gait assistance: Min assist;Mod assist Ambulation Distance (Feet): 60 Feet Assistive device: Rolling walker (2 wheeled) Gait Pattern/deviations: Step-to pattern;Decreased step length - right;Decreased dorsiflexion - right;Trunk flexed     General Gait Details: multimodal cues for posture, weight shifting, and increased R step length; assistance for balance and safe management of RW; pt with R LE weakness and, especially when fatigued, pt's R LE drags behind him; pt does keep the RW too far away which family reports as  baseline   Social research officer, government Rankin (Stroke Patients Only)       Balance Overall balance assessment: Needs assistance Sitting-balance support: No upper extremity supported;Feet supported Sitting balance-Leahy Scale: Fair     Standing balance support: Bilateral upper extremity supported Standing balance-Leahy Scale: Poor                              Cognition Arousal/Alertness: Awake/alert Behavior During Therapy: WFL for tasks assessed/performed Overall Cognitive Status: Impaired/Different from baseline Area of Impairment: Attention;Following commands;Problem solving;Orientation;Memory;Safety/judgement;Awareness                 Orientation Level: Disoriented to;Place;Time;Situation Current Attention Level: Sustained Memory: Decreased short-term memory Following Commands: Follows one step commands with increased time;Follows one step commands consistently Safety/Judgement: Decreased awareness of safety Awareness: Intellectual Problem Solving: Slow processing;Decreased initiation;Requires verbal cues        Exercises      General Comments General comments (skin integrity, edema, etc.): family present      Pertinent Vitals/Pain Pain Assessment: No/denies pain    Home Living                      Prior Function            PT Goals (current goals can now be found in the care plan section) Acute Rehab PT Goals PT Goal Formulation: With patient/family Time For Goal Achievement: 06/21/17 Potential to Achieve Goals:  Fair Progress towards PT goals: Progressing toward goals    Frequency    Min 4X/week      PT Plan Discharge plan needs to be updated    Co-evaluation              AM-PAC PT "6 Clicks" Daily Activity  Outcome Measure  Difficulty turning over in bed (including adjusting bedclothes, sheets and blankets)?: A Lot Difficulty moving from lying on back to sitting on the side  of the bed? : Unable Difficulty sitting down on and standing up from a chair with arms (e.g., wheelchair, bedside commode, etc,.)?: Unable Help needed moving to and from a bed to chair (including a wheelchair)?: A Little Help needed walking in hospital room?: A Little Help needed climbing 3-5 steps with a railing? : A Lot 6 Click Score: 12    End of Session Equipment Utilized During Treatment: Gait belt Activity Tolerance: Patient tolerated treatment well Patient left: with call bell/phone within reach;with family/visitor present;in chair Nurse Communication: Mobility status PT Visit Diagnosis: Other abnormalities of gait and mobility (R26.89);Muscle weakness (generalized) (M62.81);History of falling (Z91.81);Difficulty in walking, not elsewhere classified (R26.2)     Time: 1310-1336 PT Time Calculation (min) (ACUTE ONLY): 26 min  Charges:  $Gait Training: 8-22 mins $Therapeutic Activity: 8-22 mins                    G Codes:       Erline Levine, PTA Pager: [phone]     Carolynne Edouard 06/08/2017, 3:12 PM

## 2017-06-08 NOTE — Progress Notes (Signed)
  Speech Language Pathology Treatment: Dysphagia  Patient Details Name:Levi Robinson MRN: [id number] DOB: May 22, 1921 Today's Date: 06/08/2017 Time: 0905-0920 SLP Time Calculation (min) (ACUTE ONLY): 15 min  Assessment / Plan / Recommendation Clinical Impression  Pt seen for skilled ST treatment for dysphagia. Pt just returning from CT head which showed mild decrease in the size of the right subdural hemorrhage. Pt tired but awake. Granddaughter reports confusion overnight however pt has been more alert and aware during the day, making jokes and communicating with family members, expressing desire to eat "real food." Reports breakfast consumed without difficulties. SLP provided upgraded texture trials with regular solids. Pt able to self feed without assistance, independently alternating solids with cup and straw sips of thin liquids to facilitate oral clearance. Subtle delayed throat clear noted x1, no overt signs of aspiration. Denture adhesive still unavailable; will advance to dys 3 (mechanical soft) with thin liquids, continue meds whole with puree. Ensure pt alert and upright for meals; allow self-feeding. Will f/u for tolerance and likely advancement to regular textures.     HPI HPI: Levi Robinson a 84 y.o.malewith hx of CAD/ stent, DM2, HTN, MI who presents after a fall at home. Family member heard a thud. Brought to ED and eval per head CT showing a significant R SDH with R>L shift. Altered mental status which is now seeming to improve some. Family requests no invasive procedures. Swallow evaluation ordered.       SLP Plan  Continue with current plan of care       Recommendations  Diet recommendations: Dysphagia 3 (mechanical soft);Thin liquid Liquids provided via: Cup;Straw Medication Administration: Whole meds with puree Supervision: Intermittent supervision to cue for compensatory strategies Compensations: Minimize environmental distractions;Slow rate;Small  sips/bites Postural Changes and/or Swallow Maneuvers: Seated upright 90 degrees                Oral Care Recommendations: Oral care BID Follow up Recommendations: Other (comment)(tba) SLP Visit Diagnosis: Dysphagia, unspecified (R13.10) Plan: Continue with current plan of care       GO               Rondel Baton, MS, CCC-SLP Speech-Language Pathologist [phone]  Arlana Lindau 06/08/2017, 10:17 AM

## 2017-06-09 DIAGNOSIS — I2510 Atherosclerotic heart disease of native coronary artery without angina pectoris: Secondary | ICD-10-CM

## 2017-06-09 DIAGNOSIS — E0865 Diabetes mellitus due to underlying condition with hyperglycemia: Secondary | ICD-10-CM

## 2017-06-09 DIAGNOSIS — N183 Chronic kidney disease, stage 3 unspecified: Secondary | ICD-10-CM

## 2017-06-09 DIAGNOSIS — S065X9A Traumatic subdural hemorrhage with loss of consciousness of unspecified duration, initial encounter: Principal | ICD-10-CM

## 2017-06-09 DIAGNOSIS — I480 Paroxysmal atrial fibrillation: Secondary | ICD-10-CM

## 2017-06-09 DIAGNOSIS — I10 Essential (primary) hypertension: Secondary | ICD-10-CM

## 2017-06-09 LAB — BASIC METABOLIC PANEL
ANION GAP: 7 (ref 5–15)
BUN: 19 mg/dL (ref 6–20)
CALCIUM: 8.6 mg/dL — AB (ref 8.9–10.3)
CO2: 29 mmol/L (ref 22–32)
Chloride: 100 mmol/L — ABNORMAL LOW (ref 101–111)
Creatinine, Ser: 1.22 mg/dL (ref 0.61–1.24)
GFR, EST AFRICAN AMERICAN: 57 mL/min — AB (ref >60)
GFR, EST NON AFRICAN AMERICAN: 49 mL/min — AB (ref >60)
Glucose, Bld: 189 mg/dL — ABNORMAL HIGH (ref 65–99)
Potassium: 3.7 mmol/L (ref 3.5–5.1)
SODIUM: 136 mmol/L (ref 135–145)

## 2017-06-09 LAB — GLUCOSE, CAPILLARY
GLUCOSE-CAPILLARY: 205 mg/dL — AB (ref 65–99)
GLUCOSE-CAPILLARY: 142 mg/dL — AB (ref 65–99)
GLUCOSE-CAPILLARY: 276 mg/dL — AB (ref 65–99)
Glucose-Capillary: 158 mg/dL — ABNORMAL HIGH (ref 65–99)
Glucose-Capillary: 219 mg/dL — ABNORMAL HIGH (ref 65–99)

## 2017-06-09 MED ORDER — TRAZODONE HCL 50 MG PO TABS
25.0000 mg | ORAL_TABLET | Freq: Once | ORAL | Status: AC
  Administered 2017-06-09: 25 mg via ORAL
  Filled 2017-06-09: qty 1

## 2017-06-09 MED ORDER — ONDANSETRON 4 MG PO TBDP
4.0000 mg | ORAL_TABLET | Freq: Four times a day (QID) | ORAL | Status: DC | PRN
  Administered 2017-06-09: 4 mg via ORAL
  Filled 2017-06-09: qty 1

## 2017-06-09 NOTE — Progress Notes (Signed)
    Durable Medical Equipment  (From admission, onward)        Start     Ordered   06/09/17 1143  For home use only DME 3 n 1  Once     06/09/17 1142   06/09/17 0000  DME Hospital bed    Question Answer Comment  The above medical condition requires: Patient requires the ability to reposition frequently   Head must be elevated greater than: 30 degrees   Bed type Semi-electric      06/09/17 1635

## 2017-06-09 NOTE — Consult Note (Signed)
[facility] CM Primary Care Navigator  06/09/2017  Levi Robinson Nov 23, 1921 [id number]   Met with patientand son in-law at the bedside to identify possible discharge needs and spoke with patient's granddaughter (Tammy- lives next door) on the phone. Son in-law reportsthat patient had a fall at home and hit his head (subdural hematoma) that resulted to this admission.  Patient's son in law endorsesDr. Merrilee Seashore with [facility] as hisprimary care provider and also sees Dr Standley Dakins as [facility].   [facility] to [facility] withoutdifficulty.   [name] has been managinghismedications at home.  Per son in-law, patient's granddaughter has been driving andprovidingtransportation tohisdoctors'appointments.  Aside from granddaughter and great granddaughter who provides assistance with care needs at home especially on the weekends, patient receives aide service through the [facility] [employer]) for 4 days a week, 4 hours a day per son in-law, and in the process of having this increased.  Anticipated discharge plan ishomewith home health services per Inpatient CM note.  Patient's son in-lawvoiced understanding to call primary care provider's officewhen patient gets homefor a post discharge follow-up appointment within1- 2 weeksor sooner if needs arise.Patient letter (with PCP's contact number) wasprovided as a reminder.  Patient has history of coronary artery disease stent, DM Type II, HTN and myocardial infarction.  Hisrecent A1c is 9.3- (uncontrolled) and will need medical follow up after discharge in managing it.  Patient's son in-law mentioned the need for further education, information and guidance on patient's diabetic medications such as insulin use and sliding scale coverage as well as managing  hypoglycemia/ hyperglycemia. Need to reinforce diet restrictions and monitor adherence with diet as well. [name] and great granddaughter have been monitoring/ recording blood sugars for patient and helping patient manage his health issues at home.  Explained to patient and family about [facility] care management services and resources available forfurther health management at home andhadvoiced interest about it. Patient's family expressedwillingness to have telephone calls as opposed to home visits to provide support for chronic disease management at home after discharge.   Patient's familyverbally agreed and opted for [facility]to [facility] Telephoniccare managementforfurther assistance in managing chronic health issuesand provide information/healtheducation on ways to further manage DM and other health issues.  Referral made to THNTelephoniccare managementcoordinator forfollow-upof needsafter discharge,provide information/health educationand reinforce disease management of DMand other chronic illnesses(HF)at home.  THNcare managementinformation provided for future needs that may arise.   For additional questions please contact:  Edwena Felty A. Amarissa Koerner, BSN, RN-BC [facility] PRIMARY CARE Navigator Cell: [phone]

## 2017-06-09 NOTE — Discharge Instructions (Signed)
Head Injury, Adult There are many types of head injuries. They can be as minor as a bump. Some head injuries can be worse. Worse injuries include:  A strong hit to the head that hurts the brain (concussion).  A bruise of the brain (contusion). This means there is bleeding in the brain that can cause swelling.  A cracked skull (skull fracture).  Bleeding in the brain that gathers, gets thick (makes a clot), and forms a bump (hematoma).  Most problems from a head injury come in the first 24 hours. However, you may still have side effects up to 7-10 days after your injury. It is important to watch your condition for any changes. Follow these instructions at home: Activity  Rest as much as possible.  Avoid activities that are hard or tiring.  Make sure you get enough sleep.  Limit activities that need a lot of thought or attention, such as: ? Watching TV. ? Playing memory games and puzzles. ? Job-related work or homework. ? Working on [employer], and texting.  Avoid activities that could cause another head injury until your doctor says it is okay. This includes playing sports.  Ask your doctor when it is safe for you to go back to your normal activities, such as work or school. Ask your doctor for a step-by-step plan for slowly going back to your normal activities.  Ask your doctor when you can drive, ride a bicycle, or use heavy machinery. Never do these activities if you are dizzy. Lifestyle  Do not drink alcohol until your doctor says it is okay.  Avoid drug use.  If it is harder than usual to remember things, write them down.  If you are easily distracted, try to do one thing at a time.  Talk with family members or close friends when making important decisions.  Tell your friends, family, a trusted coworker, and work Freight forwarder about your injury, symptoms, and limits (restrictions). Have them watch for any problems that are new or getting worse. General  instructions  Take over-the-counter and prescription medicines only as told by your doctor.  Have someone stay with you for 24 hours after your head injury. This person should watch you for any changes in your symptoms and be ready to get help.  Keep all follow-up visits as told by your doctor. This is important. How is this prevented?  Work on Astron[employer]. This can help you avoid falls.  Wear a seatbelt when you are in a moving vehicle.  Wear a helmet when: ? Riding a bicycle. ? Skiing. ? Doing any other sport or activity that has a risk of injury.  Drink alcohol only in moderation.  Make your home safer by: ? Getting rid of clutter from the floors and stairs, like things that can make you trip. ? Using grab bars in bathrooms and handrails by stairs. ? Placing non-slip mats on floors and in bathtubs. ? Putting more light in dim areas. Get help right away if:  You have: ? A very bad (severe) headache that is not helped by medicine. ? Trouble walking or weakness in your arms and legs. ? Clear or bloody fluid coming from your nose or ears. ? Changes in your seeing (vision). ? Jerky movements that you cannot control (seizure).  You throw up (vomit).  Your symptoms get worse.  You lose balance.  Your speech is slurred.  You pass out.  You are sleepier and have trouble staying awake.  The black centers of your eyes (pupils) change in size. These symptoms may be an emergency. Do not wait to see if the symptoms will go away. Get medical help right away. Call your local emergency services (911 in the [place] Do not drive yourself to the hospital. This information is not intended to replace advice given to you by your health care provider. Make sure you discuss any questions you have with your health care provider. Document Released: 12/13/2007 Document Revised: 04/25/2016 Document Reviewed: 07/10/2015 Elsevier Interactive Patient Education  2018 Elsevier  Inc.   Subdural Hematoma A subdural hematoma is a collection of blood between the brain and its tough outer covering (dura). As the amount of blood increases, it puts pressure on the brain. There are two types of subdural hematomas:  Acute. This type develops shortly after a hard, direct hit (blow) to the head and causes blood to collect very quickly. This is a medical emergency. If it is not diagnosed and treated quickly, it can lead to severe brain injury or death.  Chronic. This is when bleeding develops more slowly, over weeks or months.  What are the causes? This condition is caused by bleeding (hemorrhage) from a broken (ruptured) blood vessel. In most cases, a blood vessel ruptures and bleeds because of injury (trauma) to the head, such as from a hard, direct hit. Head trauma can happen in:  Traffic accidents.  Falls.  Assaults.  Sport injuries.  In rare cases, hemorrhage can happen without a known cause (spontaneously), especially if you take blood thinners (anticoagulants). What increases the risk? This condition is more likely to develop in:  Older people.  Infants.  People who take blood thinners.  People who have injured their head.  People who abuse alcohol.  What are the signs or symptoms? Depending on the size of the hematoma, symptoms can vary from mild to severe and life-threatening. Symptoms in acute subdural hematoma can develop over minutes or hours. Symptoms in chronic subdural hematoma may develop over weeks or months.  Headaches.  Nausea or vomiting.  Changes in vision, such as double vision or loss of vision.  Changes in speech.  Loss of balance or difficulty walking.  Weakness, numbness, or tingling in the arms or legs on one side of the body.  Jerky movements that you cannot control (seizures).  Change in personality.  Increased sleepiness.  Memory loss.  Loss of consciousness.  Coma.  How is this diagnosed? This condition is  diagnosed based on the results of:  A physical and neurological exam.  CT scan.  MRI.  How is this treated? Treatment for this condition depends on the severity and the type of subdural hematoma that you have. You may need to temporarily stop taking blood thinners, if this applies. You may be given antiseizure (anticonvulsant) medicine. Treatment for acute subdural hematoma may include:  Medicines that help the body get rid of excess fluids (diuretics). These may help reduce pressure in the brain.  Assisted breathing (ventilation). This involves using a machine called a ventilator to help you breathe. This helps to reduce pressure in the brain, especially if there is swelling of the brain.  Emergency surgery to drain blood or remove a blood clot.  Treatment for chronic subdural hematoma may include:  Observation and bed rest at the hospital.  Emergency surgery. This may be done if the bleeding is large, or if you have neurological symptoms such as weakness or numbness.  Sometimes, no treatment is needed for chronic  subdural hematoma. Follow these instructions at home: Activity  Avoid any situation where there is potential for another head injury, such as football, hockey, soccer, basketball, martial arts, downhill snow sports, and horseback riding. Do not do these activities until your health care provider approves. ? If you play a contact sport and you experience a head injury, follow advice from your health care provider about when you can return to the sport. If you get another injury while you are healing, you may experience another hemorrhage.  Avoid excessive visual stimulation while recovering. This includes working on the computer, watching TV, and reading.  Try to avoid activities that cause physical or mental stress. Stay home from work or school as directed by your health care provider.  Do not drive, ride a bicycle, or use heavy machinery until your health care provider  approves.  Do not lift anything that is heavier than 5 lb (2.3 kg) until your health care provider approves.  If physical therapy was prescribed, do exercises as told by your health care provider or physical therapist.  Rest as told by your health care provider. Rest helps the brain to heal.  Make sure you: ? Get plenty of sleep. Avoid staying up late at night. ? Keep a consistent sleep schedule. Try to go to sleep and wake up at about the same time every day. General instructions  Recovery from brain injuries varies widely. Talk with your health care provider about what to expect. Monitor your symptoms, and ask people around you to do the same.  Take over-the-counter and prescription medicines only as told by your health care provider. Do not take blood thinners or NSAIDs unless your health care provider approves. This includes aspirin, ibuprofen, naproxen, and warfarin.  Limit alcohol intake to no more than 1 drink per day for nonpregnant women and 2 drinks per day for men. One drink equals 12 oz of beer, 5 oz of wine, or 1 oz of hard liquor.  Keep all follow-up visits as told by your health care provider. This is important. How is this prevented?  Wear protective gear, such as helmets, when participating in activities such as biking or contact sports.  Always wear a seat belt when you are in a motor vehicle.  Keep your home environment safe to reduce the risk of falling: ? Remove clutter and tripping hazards from floors and stairways, such as loose rugs and extension cords. ? Use grab bars in bathrooms and handrails by stairs. ? Place non-slip mats on floors and in bathtubs. ? Improve lighting in dim areas. Where to find more information:  [employer] of Neurological Disorders and Stroke: [employer].co.uk  American Association of Neurological Surgeons: http://www.aans.org  American Academy of Neurology (AAN): ComparePet.cz  Brain Injury Association of America:  www.biausa.org Get help right away if:  You develop symptoms of subdural hematoma.  You are taking blood thinners and you fall or you experience minor trauma to the head. If you take any blood thinners, even a very small injury can cause a subdural hematoma. You should get help right away, even if you think your symptoms are mild.  You have a bleeding disorder and you fall or you experience minor trauma to the head.  You experience a head injury and you develop any of the following symptoms: ? Clear fluid draining from your nose or ears. ? Nausea. ? Vomiting. ? Slurred speech. ? Seizures. ? Drowsiness or a decrease in alertness. ? Double vision. ? Numbness or inability to  move (paralysis) in any part of your body. ? Difficulty walking or poor coordination. ? Difficulty thinking. ? Confusion or forgetfulness. ? Personality changes. ? Irrational or aggressive behavior. ? A history of heavy alcohol use. These symptoms may represent a serious problem that is an emergency. Do not wait to see if the symptoms will go away. Get medical help right away. Call your local emergency services (911 in the [place] Do not drive yourself to the hospital. Summary  A subdural hematoma is a collection of blood between the brain and its tough outer covering.  Treatment for this condition depends on the severity and the type of subdural hemorrhage that you have.  Symptoms can vary from mild to severe and life-threatening.  Monitor your symptoms, and ask others around you to do the same. This information is not intended to replace advice given to you by your health care provider. Make sure you discuss any questions you have with your health care provider. Document Released: 11/17/2003 Document Revised: 12/05/2015 Document Reviewed: 12/05/2015 Elsevier Interactive Patient Education  [employer].

## 2017-06-09 NOTE — Progress Notes (Signed)
Physical Therapy Treatment Patient Details Name: Levi Robinson MRN: [id number] DOB: [birth year] Today's Date: 06/09/2017    History of Present Illness Pt is a 84 y.o. male who presented to ED after a fall at home. CT revealing significant R SDH with R>L shift. Awaiting bilateral hip x-ray. PMH significant for history of CAD/stent, DM2, HTN, and MI.    PT Comments    Pt agreeable to ambulating with therapy, however is limited in his safe mobility by decreased safety awareness, R LE pain/weakness and decreased cognition. Pt min guard for bed mobility, minA for transfers and mod A for ambulation with RW, minAx2 for ambulation with HHA. Pt lacks knowledge of proper RW usage and does not follow instructions related to its use. Pt safer with gait utilizing HHA as he is much steadier. With 24 hr supervision pt will benefit from being in the familiar surrounds of his own home with HHPT to help him progress his safe mobility.   Follow Up Recommendations  Home health PT;Supervision/Assistance - 24 hour     Equipment Recommendations  None recommended by PT    Recommendations for Other Services       Precautions / Restrictions Precautions Precautions: Fall Restrictions Weight Bearing Restrictions: No    Mobility  Bed Mobility Overal bed mobility: Needs Assistance Bed Mobility: Supine to Sit     Supine to sit: Min guard     General bed mobility comments: min guard for safety; use of rail and increased time/effort  Transfers Overall transfer level: Needs assistance Equipment used: Rolling walker (2 wheeled) Transfers: Sit to/from Stand Sit to Stand: Min assist         General transfer comment: min A for power up and steadying with RW  Ambulation/Gait Ambulation/Gait assistance: Min assist;Mod assist Ambulation Distance (Feet): 80 Feet Assistive device: Rolling walker (2 wheeled);1 person hand held assist Gait Pattern/deviations: Step-to pattern;Decreased step length -  right;Decreased dorsiflexion - right;Trunk flexed Gait velocity: slowed Gait velocity interpretation: <1.31 ft/sec, indicative of household ambulator General Gait Details: modA for steadying with RW, max verbal cuing for proximity to RW, and weight shifting, ultimately stopped ambulation and switched to HHAx2 and pt was able to utilize UE strength to advance R LE and reduced R foot drag.        Balance Overall balance assessment: Needs assistance Sitting-balance support: No upper extremity supported;Feet supported Sitting balance-Leahy Scale: Fair     Standing balance support: Bilateral upper extremity supported Standing balance-Leahy Scale: Poor                              Cognition Arousal/Alertness: Awake/alert Behavior During Therapy: WFL for tasks assessed/performed Overall Cognitive Status: Impaired/Different from baseline Area of Impairment: Attention;Following commands;Problem solving;Orientation;Memory;Safety/judgement;Awareness                 Orientation Level: Disoriented to;Time;Situation Current Attention Level: Sustained Memory: Decreased short-term memory Following Commands: Follows one step commands with increased time;Follows one step commands consistently Safety/Judgement: Decreased awareness of safety Awareness: Intellectual Problem Solving: Slow processing;Decreased initiation;Requires verbal cues           General Comments General comments (skin integrity, edema, etc.): son present and states that pt did not sleep last night and pt is weaker today      Pertinent Vitals/Pain Pain Assessment: No/denies pain    Home Living     Available Help at Discharge: Family;Personal care attendant;Available 24 hours/day;Other (Comment) Type of Home: House  Prior Function            PT Goals (current goals can now be found in the care plan section) Acute Rehab PT Goals PT Goal Formulation: With patient/family Time For  Goal Achievement: 06/21/17 Potential to Achieve Goals: Fair Progress towards PT goals: Progressing toward goals    Frequency    Min 4X/week      PT Plan Discharge plan needs to be updated       AM-PAC PT "6 Clicks" Daily Activity  Outcome Measure  Difficulty turning over in bed (including adjusting bedclothes, sheets and blankets)?: A Lot Difficulty moving from lying on back to sitting on the side of the bed? : Unable Difficulty sitting down on and standing up from a chair with arms (e.g., wheelchair, bedside commode, etc,.)?: Unable Help needed moving to and from a bed to chair (including a wheelchair)?: A Little Help needed walking in hospital room?: A Little Help needed climbing 3-5 steps with a railing? : A Lot 6 Click Score: 12    End of Session Equipment Utilized During Treatment: Gait belt Activity Tolerance: Patient tolerated treatment well Patient left: with call bell/phone within reach;with family/visitor present;in chair Nurse Communication: Mobility status PT Visit Diagnosis: Other abnormalities of gait and mobility (R26.89);Muscle weakness (generalized) (M62.81);History of falling (Z91.81);Difficulty in walking, not elsewhere classified (R26.2)     Time: 2956-2130 PT Time Calculation (min) (ACUTE ONLY): 22 min  Charges:  $Gait Training: 8-22 mins                    G Codes:       Sahmya Arai B. Beverely Risen PT, DPT Acute Rehabilitation  [phone] Pager [phone]     Elon Alas [facility] 06/09/2017, 4:41 PM

## 2017-06-09 NOTE — NC FL2 (Signed)
[place] MEDICAID FL2 LEVEL OF CARE SCREENING TOOL     IDENTIFICATION  Patient Name: Levi Robinson Birthdate: 04-15-1921 Sex: male Admission Date (Current Location): 06/05/2017  [facility] and [place] Number:  Producer, television/film/video and Address:  The . [facility], [address], [place], [place] [address]      Provider Number: [id number]  Attending Physician Name and Address:  Darlin Drop, DO  Relative Name and Phone Number:  Montez Morita; granddaughter; [phone]    Current Level of Care: Hospital Recommended Level of Care: Skilled Nursing Facility Prior Approval Number:    Date Approved/Denied:   PASRR Number: [id number]  Discharge Plan: SNF    Current Diagnoses: Patient Active Problem List   Diagnosis Date Noted  . PAF (paroxysmal atrial fibrillation) (HCC)   . Benign essential HTN   . Stage 3 chronic kidney disease (HCC)   . Atrial fibrillation with RVR (HCC) 06/05/2017  . Subdural hematoma, acute (HCC) 06/05/2017  . Altered mental status 06/05/2017  . Acute CHF (congestive heart failure) (HCC) 06/05/2017  . Fall 06/05/2017  . Uncontrolled diabetes mellitus (HCC) 06/05/2017  . DNR (do not resuscitate) 06/05/2017  . Subdural hematoma (HCC) 06/05/2017  . Occlusion and stenosis of carotid artery without mention of cerebral infarction 08/04/2011  . Hypertension   . CAD (coronary artery disease)   . Carotid arterial disease (HCC)   . Anemia   . Posterior vitreous detachment 06/05/2011  . Status post intraocular lens implant 06/05/2011  . Branch retinal vein occlusion 12/12/2010  . Nonproliferative diabetic retinopathy (HCC) 12/12/2010    Orientation RESPIRATION BLADDER Height & Weight     Self, Situation, Place  Normal Incontinent, External catheter Weight: 160 lb (72.6 kg) Height:   (177.8 cm)  BEHAVIORAL SYMPTOMS/MOOD NEUROLOGICAL BOWEL NUTRITION STATUS      Continent Diet(see discharge summary)  AMBULATORY STATUS  COMMUNICATION OF NEEDS Skin   Limited Assist Verbally Other (Comment)(laceration closed with staples on head)                       Personal Care Assistance Level of Assistance  Bathing, Feeding, Dressing Bathing Assistance: Limited assistance Feeding assistance: Limited assistance Dressing Assistance: Limited assistance     Functional Limitations Info  Sight, Hearing, Speech Sight Info: Adequate Hearing Info: Adequate Speech Info: Adequate    SPECIAL CARE FACTORS FREQUENCY  PT (By licensed PT), OT (By licensed OT)     PT Frequency: 5x week OT Frequency: 5x week            Contractures Contractures Info: Not present    Additional Factors Info  Allergies, Code Status, Insulin Sliding Scale Code Status Info: DNR Allergies Info: Ativan (Lorazepam)   Insulin Sliding Scale Info: insulin aspart (novoLOG) injection 0-15 Units every 4 hours; insulin detemir (LEVEMIR) injection 20 Units daily at bedtime       Current Medications (06/09/2017):  This is the current hospital active medication list Current Facility-Administered Medications  Medication Dose Route Frequency Provider Last Rate Last Dose  . 0.9 %  sodium chloride infusion  250 mL Intravenous PRN Delano Metz, MD      . acetaminophen (TYLENOL) tablet 650 mg  650 mg Oral Q6H PRN Esperanza Sheets, MD   650 mg at 06/08/17 2158  . carvedilol (COREG) tablet 6.25 mg  6.25 mg Oral BID WC Esperanza Sheets, MD   6.25 mg at 06/09/17 0844  . furosemide (LASIX) tablet 40 mg  40 mg Oral Daily Esperanza Sheets, MD   40 mg at 06/09/17 0844  . hydrALAZINE (APRESOLINE) injection 10-20 mg  10-20 mg Intravenous Q4H PRN Delano Metz, MD      . insulin aspart (novoLOG) injection 0-15 Units  0-15 Units Subcutaneous Q4H Delano Metz, MD   5 Units at 06/09/17 1148  . insulin detemir (LEVEMIR) injection 20 Units  20 Units Subcutaneous QHS Esperanza Sheets, MD   20 Units at 06/08/17 2158  . ondansetron (ZOFRAN) injection 4 mg   4 mg Intravenous Q6H PRN Audrea Muscat T, NP   4 mg at 06/05/17 2232  . ondansetron (ZOFRAN-ODT) disintegrating tablet 4 mg  4 mg Oral Q6H PRN Dow Adolph N, DO   4 mg at 06/09/17 1102  . sodium chloride flush (NS) 0.9 % injection 3 mL  3 mL Intravenous Q12H Delano Metz, MD   3 mL at 06/08/17 5409  . sodium chloride flush (NS) 0.9 % injection 3 mL  3 mL Intravenous PRN Delano Metz, MD   3 mL at 06/06/17 8119     Discharge Medications: Please see discharge summary for a list of discharge medications.  Relevant Imaging Results:  Relevant Lab Results:   Additional Information SS#[id number] [place], [place]

## 2017-06-09 NOTE — Progress Notes (Signed)
Inpatient Rehabilitation-Admissions Coordinator   Met with pt and son-in-law at the bedside as follow up from PM&R consult. AC discussed recommendations for rehab venue [facility] vs SNF). Son-in-law reports he knows he needs more care at home and is looking to the [place] for assistance. Son-in-law feels pt is near physical baseline function but with more confusion. AC communicated with SW and CM regarding recommendation. Call for questions.    Jhonnie Garner, OTR/L  Rehab Admissions Coordinator  [phone] 06/09/2017 11:50 AM

## 2017-06-09 NOTE — Care Management Note (Signed)
Case Management Note  Patient Details  Name: Levi Robinson MRN: [id number] Date of Birth: 1921/10/27  Subjective/Objective:     Pt admitted with subdural hematoma. He is from home with family. Pt has rollator, walker and tub seat at home.             Action/Plan: Recommendations are for CIR. CIR feels he is at his baseline. Next recommendation is for SNF but family wants to take him home with [facility] services.  Pt already receives aide service through the [place] for 3 days a week 2.5-3 hours a day. Family is interested in having this increased. CM left voice mail for [place] CSW at [facility] regarding increase in hours for the aides they are providing. Family to also f/u with this through the [place].  CM provided choice of HH agencies and they selected [facility]. Cory with [facility] notified and accepted the referral.  Pt with orders for 3 in 1. James with [facility] DME notified and will deliver the equipment to the room. Family to provide transport home.    Expected Discharge Date:  06/09/17               Expected Discharge Plan:  IP Rehab Facility  In-House Referral:     Discharge planning Services  CM Consult  Post Acute Care Choice:  Durable Medical Equipment, Home Health Choice offered to:  Patient, Adult Children(Son in law)  DME Arranged:  3-N-1 DME Agency:     HH Arranged:  RN, PT, OT HH Agency:  [facility]  Status of Service:  Completed, signed off  If discussed at Long Length of Stay Meetings, dates discussed:    Additional Comments:  Kermit Balo, RN 06/09/2017, 11:46 AM

## 2017-06-09 NOTE — Evaluation (Signed)
Speech Language Pathology Evaluation Patient Details Name: Levi Robinson MRN: [id number] DOB: 1921-12-09 Today's Date: 06/09/2017 Time: 8295-6213 SLP Time Calculation (min) (ACUTE ONLY): 16 min  Problem List:  Patient Active Problem List   Diagnosis Date Noted  . PAF (paroxysmal atrial fibrillation) (HCC)   . Benign essential HTN   . Stage 3 chronic kidney disease (HCC)   . Atrial fibrillation with RVR (HCC) 06/05/2017  . Subdural hematoma, acute (HCC) 06/05/2017  . Altered mental status 06/05/2017  . Acute CHF (congestive heart failure) (HCC) 06/05/2017  . Fall 06/05/2017  . Uncontrolled diabetes mellitus (HCC) 06/05/2017  . DNR (do not resuscitate) 06/05/2017  . Subdural hematoma (HCC) 06/05/2017  . Occlusion and stenosis of carotid artery without mention of cerebral infarction 08/04/2011  . Hypertension   . CAD (coronary artery disease)   . Carotid arterial disease (HCC)   . Anemia   . Posterior vitreous detachment 06/05/2011  . Status post intraocular lens implant 06/05/2011  . Branch retinal vein occlusion 12/12/2010  . Nonproliferative diabetic retinopathy (HCC) 12/12/2010   Past Medical History:  Past Medical History:  Diagnosis Date  . Anemia    a. Felt to be iatrogenic from procedures/sticks 06/2011  . Arthritis   . CAD (coronary artery disease)    a. NSTEMI s/p complex PCI to prox Cx 07/03/11  . Carotid arterial disease (HCC)    a. By MRI 05/2011;  b. 06/2011 Carotid U/S: LICA 80-99%, RICA 100%, patent vertebrals.  . Diabetes mellitus   . Encephalopathy    AMS during 06/2011 hospitalization - MRI of brain without infarct but did show R carotid dz, abnl EEG with nonspecific encephalopathy, felt secondary to drugs  . Hypertension   . Myocardial infarction (HCC)   . Transient atrial fibrillation or flutter    Brief WCT during hospitalization 06/2011 felt to be afib with abberation, not a good coumadin candidate   Past Surgical History:  Past Surgical History:   Procedure Laterality Date  . APPENDECTOMY  1948  . LEFT HEART CATHETERIZATION WITH CORONARY ANGIOGRAM N/A 07/01/2011   Procedure: LEFT HEART CATHETERIZATION WITH CORONARY ANGIOGRAM;  Surgeon: Herby Abraham, MD;  Location: [facility] CATH LAB;  Service: Cardiovascular;  Laterality: N/A;  . PERCUTANEOUS CORONARY STENT INTERVENTION (PCI-S) N/A 07/03/2011   Procedure: PERCUTANEOUS CORONARY STENT INTERVENTION (PCI-S);  Surgeon: Herby Abraham, MD;  Location: [facility] CATH LAB;  Service: Cardiovascular;  Laterality: N/A;  . thumb surgery  1975   left   HPI:  Levi Robinson a 84 y.o.malewith hx of CAD/ stent, DM2, HTN, MI who presents after a fall at home. Family member heard a thud. Brought to ED and eval per head CT showing a significant R SDH with R>L shift. Altered mental status which is now seeming to improve some. Family requests no invasive procedures. Swallow evaluation ordered.    Assessment / Plan / Recommendation Clinical Impression  Pt with baseline cognitive deficits and required help with all ADLs (except for self-feeding). Daughter and granddaughter share this responsibility. Per their report, pt is at baseline cognitive-linguistic function except for increased confusion at night (MD planning to start low dose of seroquel). Of note, pt was consuming snack with SLP entered room, without overt s/s of aspiration. Education provided to family on diet texture and how to prepare food at home and general aspiration precautions. No further services are indicated as needed by family. ST to sign off.     SLP Assessment  SLP Recommendation/Assessment: Patient does not need any  further Speech Lanaguage Pathology Services SLP Visit Diagnosis: Cognitive communication deficit (R41.841);Dysphagia, unspecified (R13.10)    Follow Up Recommendations  None    Frequency and Duration           SLP Evaluation Cognition  Overall Cognitive Status: History of cognitive impairments - at baseline(except for  confusion during night - going to try seroquel) Arousal/Alertness: Awake/alert Orientation Level: Oriented to person;Oriented to place;Oriented to situation;Disoriented to time Attention: Selective Selective Attention: Appears intact       Comprehension  Auditory Comprehension Overall Auditory Comprehension: Impaired at baseline    Expression Expression Primary Mode of Expression: Verbal Verbal Expression Overall Verbal Expression: Impaired at baseline   Oral / Motor  Oral Motor/Sensory Function Overall Oral Motor/Sensory Function: (no focal weakness noted) Motor Speech Overall Motor Speech: Appears within functional limits for tasks assessed Phonation: Normal Articulation: Within functional limitis Intelligibility: Intelligible Motor Planning: Witnin functional limits Motor Speech Errors: Not applicable   GO                    Levi Robinson 06/09/2017, 2:09 PM

## 2017-06-09 NOTE — Consult Note (Signed)
Physical Medicine and Rehabilitation Consult Reason for Consult: Decreased functional mobility Referring Physician: Dr. Nicholos Johns   HPI: Levi Robinson is a 84 y.o. right-handed male with history of type 2 diabetes mellitus, atrial fibrillation, CAD with stenting maintained on aspirin and Plavix, hypertension.  Per chart review and family, patient lives with his great granddaughter he also has an Engineer, production and assistance provided by family and aide.  One level home with ramped entrance.  Patient uses a cane and a walker prior to admission and needed assistance with all ADLs.  Presented 06/05/2017 after unwitnessed fall while at home.  Patient sustained laceration to the scalp.  CT of the head reviewed, showing right SDH.  Per report, large subdural hematoma over the right cerebral convexity resulting in mass-effect and a 4 mm midline shift.  No hydrocephalus.  X-rays bilateral hips negative.  Conservative care noted as family had refused any invasive procedures.  They did not want to pursue any surgical options.  Mechanical soft diet with thin liquids.  Follow-up CT of the head 06/08/2017 showed mild decrease in size of right subdural hemorrhage.  No evidence of new intracranial findings.  Physical therapy evaluation completed and ongoing with recommendations of physical medicine rehab consult.  Review of Systems  Constitutional: Negative for chills and fever.  HENT: Positive for hearing loss.   Eyes: Negative for blurred vision and double vision.  Respiratory: Negative for shortness of breath.   Cardiovascular: Negative for chest pain.  Gastrointestinal: Positive for constipation. Negative for nausea and vomiting.  Genitourinary: Positive for urgency. Negative for dysuria, flank pain and hematuria.  Musculoskeletal: Positive for falls and myalgias.  Skin: Negative for rash.  Neurological: Positive for headaches.  All other systems reviewed and are negative.  Past Medical History:    Diagnosis Date  . Anemia    a. Felt to be iatrogenic from procedures/sticks 06/2011  . Arthritis   . CAD (coronary artery disease)    a. NSTEMI s/p complex PCI to prox Cx 07/03/11  . Carotid arterial disease (HCC)    a. By MRI 05/2011;  b. 06/2011 Carotid U/S: LICA 80-99%, RICA 100%, patent vertebrals.  . Diabetes mellitus   . Encephalopathy    AMS during 06/2011 hospitalization - MRI of brain without infarct but did show R carotid dz, abnl EEG with nonspecific encephalopathy, felt secondary to drugs  . Hypertension   . Myocardial infarction (HCC)   . Transient atrial fibrillation or flutter    Brief WCT during hospitalization 06/2011 felt to be afib with abberation, not a good coumadin candidate   Past Surgical History:  Procedure Laterality Date  . APPENDECTOMY  1948  . LEFT HEART CATHETERIZATION WITH CORONARY ANGIOGRAM N/A 07/01/2011   Procedure: LEFT HEART CATHETERIZATION WITH CORONARY ANGIOGRAM;  Surgeon: Herby Abraham, MD;  Location: [facility] CATH LAB;  Service: Cardiovascular;  Laterality: N/A;  . PERCUTANEOUS CORONARY STENT INTERVENTION (PCI-S) N/A 07/03/2011   Procedure: PERCUTANEOUS CORONARY STENT INTERVENTION (PCI-S);  Surgeon: Herby Abraham, MD;  Location: [facility] CATH LAB;  Service: Cardiovascular;  Laterality: N/A;  . thumb surgery  1975   left   Family History  Problem Relation Age of Onset  . Diabetes Mother   . Cancer Father   . Cancer Sister   . Cancer Brother   . Cancer Daughter    Social History:  reports that he quit smoking about 31 years ago. His smoking use included cigarettes. He has never used smokeless tobacco. He reports that  he does not drink alcohol or use drugs. Allergies:  Allergies  Allergen Reactions  . Ativan [Lorazepam] Other (See Comments)    agitation   Medications Prior to Admission  Medication Sig Dispense Refill  . acetaminophen (TYLENOL) 325 MG tablet Take 650 mg by mouth every 6 (six) hours as needed (pain).     Marland Kitchen aspirin 81 MG tablet Take 1  tablet (81 mg total) by mouth daily.    . carvedilol (COREG) 12.5 MG tablet Take 6.25 mg by mouth 2 (two) times daily with a meal.    . clopidogrel (PLAVIX) 75 MG tablet Take 1 tablet (75 mg total) by mouth daily. 30 tablet 11  . fish oil-omega-3 fatty acids 1000 MG capsule Take 1 g by mouth 3 (three) times daily.     . furosemide (LASIX) 40 MG tablet TAKE 1 TABLET BY MOUTH TWICE A DAY 60 tablet 10  . insulin detemir (LEVEMIR) 100 UNIT/ML injection Inject 48 Units into the skin daily. Inject 16-20 units at dinner if needed.    . isosorbide mononitrate (IMDUR) 30 MG 24 hr tablet Take 30 mg by mouth daily after lunch.    . ketoconazole (NIZORAL) 2 % cream Apply 1 application topically as needed. For rash    . levothyroxine (SYNTHROID, LEVOTHROID) 50 MCG tablet Take 1 tablet (50 mcg total) by mouth daily. 30 tablet 3  . Menthol-Methyl Salicylate (TIGER BALM LINIMENT EX) Apply 1 application topically as needed (neck pain).    . nitroGLYCERIN (NITROSTAT) 0.4 MG SL tablet Place 1 tablet (0.4 mg total) under the tongue every 5 (five) minutes x 3 doses as needed for chest pain. 25 tablet 4  . ondansetron (ZOFRAN ODT) 4 MG disintegrating tablet 4mg  ODT q4 hours prn nausea/vomit 20 tablet 0  . pantoprazole (PROTONIX) 40 MG tablet Take 40 mg by mouth daily after lunch.     . potassium chloride (K-DUR) 10 MEQ tablet Take 10 mEq by mouth daily.   0  . potassium chloride (K-DUR) 10 MEQ tablet Take 10 mEq by mouth as directed. Pt takes one and one-half a tablet daily    . simvastatin (ZOCOR) 40 MG tablet Take 20 mg by mouth daily. Pt takes 1/2 tablet at bedtime     . Specialty Vitamins Products (PROSTATE PO) Take 600 mg by mouth daily. Super beta prostate, 600 mg       Home: Home Living Family/patient expects to be discharged to:: Private residence Living Arrangements: Children, Other relatives Available Help at Discharge: Family, Personal care attendant, Available 24 hours/day, Other (Comment)(family around  80-90% of the time) Type of Home: House Home Access: Ramped entrance, Stairs to enter Home Layout: One level Bathroom Shower/Tub: Engineer, manufacturing systems: Standard Home Equipment: Environmental consultant - 4 wheels, Environmental consultant - 2 wheels, Wheelchair - manual, Grab bars - tub/shower, Tub bench Additional Comments: will have an aid 4-5 days a week  Functional History: Prior Function Level of Independence: Needs assistance Gait / Transfers Assistance Needed: walked with rollator throughout home, WC for longer distance ADL's / Homemaking Assistance Needed: aide currently assisting 2-3 times per week but family in process of increasing this to 4-5 times/week; pt requires assistance for all ADLS and IADLs from either aide or family member; does step into shower with assistance and use of grab bars Functional Status:  Mobility: Bed Mobility Overal bed mobility: Needs Assistance Bed Mobility: Supine to Sit Supine to sit: Min guard Sit to supine: Supervision General bed mobility comments: min guard for safety;  use of rail and increased time/effort Transfers Overall transfer level: Needs assistance Equipment used: Rolling walker (2 wheeled) Transfers: Sit to/from Stand Sit to Stand: Min assist General transfer comment: assist to power up and to gain balance; cues for safe hand placement Ambulation/Gait Ambulation/Gait assistance: Min assist, Mod assist Ambulation Distance (Feet): 60 Feet Assistive device: Rolling walker (2 wheeled) Gait Pattern/deviations: Step-to pattern, Decreased step length - right, Decreased dorsiflexion - right, Trunk flexed General Gait Details: multimodal cues for posture, weight shifting, and increased R step length; assistance for balance and safe management of RW; pt with R LE weakness and, especially when fatigued, pt's R LE drags behind him; pt does keep the RW too far away which family reports as baseline    ADL: ADL Overall ADL's : Needs assistance/impaired General ADL  Comments: Pt currently requiring total assistance for ADL participation. He would likely demonstrate improved participation once more alert. He was able to complete sit to supine without assistance but completes minimal movement on command.   Cognition: Cognition Overall Cognitive Status: Impaired/Different from baseline Orientation Level: Oriented to person, Oriented to place, Oriented to situation, Disoriented to time Cognition Arousal/Alertness: Awake/alert Behavior During Therapy: WFL for tasks assessed/performed Overall Cognitive Status: Impaired/Different from baseline Area of Impairment: Attention, Following commands, Problem solving, Orientation, Memory, Safety/judgement, Awareness Orientation Level: Disoriented to, Place, Time, Situation Current Attention Level: Sustained Memory: Decreased short-term memory Following Commands: Follows one step commands with increased time, Follows one step commands consistently Safety/Judgement: Decreased awareness of safety Awareness: Intellectual Problem Solving: Slow processing, Decreased initiation, Requires verbal cues General Comments: Pt lethargic on arrival and throughout session. He was able to open eyes briefly to command. He follows a few commands intermittently but does not follow the majority of commands. Per family, pt was conversant earlier today.   Blood pressure (!) 133/52, pulse (!) 58, temperature 97.8 F (36.6 C), temperature source Oral, resp. rate 17, height  (1.778 m), weight 72.6 kg (160 lb), SpO2 100 %. Physical Exam  Vitals reviewed. Constitutional: He appears well-developed and well-nourished.  HENT:  Head: Normocephalic and atraumatic.  Eyes: EOM are normal. Right eye exhibits no discharge. Left eye exhibits no discharge.  Neck: Normal range of motion. Neck supple. No thyromegaly present.  Cardiovascular: Normal rate and regular rhythm.  Respiratory: Effort normal and breath sounds normal. No respiratory  distress.  GI: Soft. Bowel sounds are normal. He exhibits no distension.  Musculoskeletal:  No edema or tenderness in extremities  Neurological: He is alert.  HOH A&Ox1 Difficulty following some commands Motor: 4+/5 grossly throughout No ataxia  Skin: Skin is warm and dry.  Psychiatric: His speech is delayed. He is slowed. He exhibits abnormal recent memory and abnormal remote memory.    Results for orders placed or performed during the hospital encounter of 06/05/17 (from the past 24 hour(s))  Hemoglobin A1c     Status: Abnormal   Collection Time: 06/08/17  9:15 AM  Result Value Ref Range   Hgb A1c MFr Bld 9.4 (H) 4.8 - 5.6 %   Mean Plasma Glucose 223.08 mg/dL  Glucose, capillary     Status: Abnormal   Collection Time: 06/08/17 11:08 AM  Result Value Ref Range   Glucose-Capillary 225 (H) 65 - 99 mg/dL  Glucose, capillary     Status: Abnormal   Collection Time: 06/08/17  4:17 PM  Result Value Ref Range   Glucose-Capillary 200 (H) 65 - 99 mg/dL   Comment 1 Notify RN    Comment  2 Document in Chart   Glucose, capillary     Status: Abnormal   Collection Time: 06/08/17  8:12 PM  Result Value Ref Range   Glucose-Capillary 209 (H) 65 - 99 mg/dL   Comment 1 Notify RN    Comment 2 Document in Chart   Glucose, capillary     Status: Abnormal   Collection Time: 06/08/17 11:27 PM  Result Value Ref Range   Glucose-Capillary 281 (H) 65 - 99 mg/dL   Comment 1 Notify RN    Comment 2 Document in Chart   Glucose, capillary     Status: Abnormal   Collection Time: 06/09/17  5:02 AM  Result Value Ref Range   Glucose-Capillary 205 (H) 65 - 99 mg/dL   Comment 1 Notify RN    Comment 2 Document in Chart   Basic metabolic panel     Status: Abnormal   Collection Time: 06/09/17  6:16 AM  Result Value Ref Range   Sodium 136 135 - 145 mmol/L   Potassium 3.7 3.5 - 5.1 mmol/L   Chloride 100 (L) 101 - 111 mmol/L   CO2 29 22 - 32 mmol/L   Glucose, Bld 189 (H) 65 - 99 mg/dL   BUN 19 6 - 20 mg/dL     Creatinine, Ser 1.61 0.61 - 1.24 mg/dL   Calcium 8.6 (L) 8.9 - 10.3 mg/dL   GFR calc non Af Amer 49 (L) >60 mL/min   GFR calc Af Amer 57 (L) >60 mL/min   Anion gap 7 5 - 15  Glucose, capillary     Status: Abnormal   Collection Time: 06/09/17  7:51 AM  Result Value Ref Range   Glucose-Capillary 158 (H) 65 - 99 mg/dL   Comment 1 Notify RN    Comment 2 Document in Chart    Ct Head Wo Contrast  Result Date: 06/08/2017 CLINICAL DATA:  Follow-up of intracranial hemorrhage. EXAM: CT HEAD WITHOUT CONTRAST TECHNIQUE: Contiguous axial images were obtained from the base of the skull through the vertex without intravenous contrast. COMPARISON:  06/05/2017 FINDINGS: Brain: The large right-sided subdural hematoma has mildly decreased in size from the prior exam, more accurately measured on the current study due to less motion artifact. The hemorrhage measures approximately 1.7 cm in greatest thickness. It creates mass effect with flattening of underlying gyri and partial sulcal effacement, and 4 mm of left midline shift. There is partial effacement of the right lateral ventricle. There has been a mild decrease in density in the subdural hemorrhage consistent with the expected normal evolution. There is no evidence of new intracranial hemorrhage. There is widening of the extra-axial space on the left, which is new since the previous examination. This measures 6-7 mm in thickness. No evidence of an ischemic infarct. There is generalized ventricular enlargement consistent with age-appropriate volume loss. No parenchymal masses. Vascular: No hyperdense vessel or unexpected calcification. Skull: Normal. Negative for fracture or focal lesion. Sinuses/Orbits: No acute finding. Other: None. IMPRESSION: 1. Mild decrease in the size of the right subdural hemorrhage with an overall decrease in density consistent with the expected normal evolution. 2. No evidence of new intracranial hemorrhage.  No skin infarct. 3. Widened  left-sided extra-axial space suggesting a small posttraumatic subdural hygroma, new since prior study. Electronically Signed   By: Amie Portland M.D.   On: 06/08/2017 09:11   Dg Hips Bilat With Pelvis 2v  Result Date: 06/07/2017 CLINICAL DATA:  Status post fall. EXAM: DG HIP (WITH OR WITHOUT PELVIS) 2V  BILAT COMPARISON:  None. FINDINGS: There is no evidence of hip fracture or dislocation. Mild bilateral hip joint osteoarthritic changes. Osteoarthritic changes of the lower lumbosacral spine. IMPRESSION: No acute fracture or dislocation identified about the hips or pelvis. Electronically Signed   By: Ted Mcalpine M.D.   On: 06/07/2017 10:23    Assessment/Plan: Diagnosis: Right subdural hematoma  Labs and images independently reviewed.  Records reviewed and summated above.  [place] score:  >/VI (?baseline)  Speech to evaluate for Post traumatic amnesia and interval GOAT scores to assess progress.  NeuroPsych evaluation for behavorial assessment.  Provide environmental management by reducing the level of stimulation, tolerating restlessness when possible, protecting patient from harming self or others and reducing patient's cognitive confusion.  Address behavioral concerns include providing structured environments and daily routines.  Cognitive therapy to direct modular abilities in order to maintain goals  including problem solving, self regulation/monitoring, self management, attention, and memory.  Fall precautions; pt at risk for second impact syndrome  Prevention of secondary injury: monitor for hypotension, hypoxia, seizures or signs of increased ICP  Prophylactic AED:   Consider pharmacological intervention if necessary with neurostimulants,  Such as amantadine, methylphenidate, modafinil, etc.  Consider Propranolol for agitation and storming  Avoid medications that could impair cognitive abilities, such as anticholinergics, antihistaminic, benzodiazapines, narcotics, etc  when possible   1. Does the need for close, 24 hr/day medical supervision in concert with the patient's rehab needs make it unreasonable for this patient to be served in a less intensive setting? Potentially  2. Co-Morbidities requiring supervision/potential complications:  type 2 DM (Monitor in accordance with exercise and adjust meds as necessary), atrial fibrillation (cont meds, monitor HR with increased physical activity), CAD (cont meds), HTN (monitor and provide prns in accordance with increased physical exertion and pain), CKD (avoid nephrotoxic meds) 3. Due to safety, disease management, medication administration and patient education, does the patient require 24 hr/day rehab nursing? Yes 4. Does the patient require coordinated care of a physician, rehab nurse, PT (1-2 hrs/day, 5 days/week) and OT (1-2 hrs/day, 5 days/week) to address physical and functional deficits in the context of the above medical diagnosis(es)? Potentially Addressing deficits in the following areas: balance, endurance, locomotion, strength, transferring, bathing, dressing, toileting, cognition and psychosocial support 5. Can the patient actively participate in an intensive therapy program of at least 3 hrs of therapy per day at least 5 days per week? Yes 6. The potential for patient to make measurable gains while on inpatient rehab is good 7. Anticipated functional outcomes upon discharge from inpatient rehab are min assist  with PT, min assist with OT, min assist and mod assist with SLP. 8. Estimated rehab length of stay to reach the above functional goals is: 4-7 days. 9. Anticipated D/C setting: Home 10. Anticipated post D/C treatments: HH therapy and Home excercise program 11. Overall Rehab/Functional Prognosis: good and fair  RECOMMENDATIONS: This patient's condition is appropriate for continued rehabilitative care in the following setting: Patient making signficant gains with therapies and appears to be at/near  baseline, where he required assistance with all ADLs.  Recommend home with HH.  If family unable to care for patient, recommend SNF with PM&R follow up. Patient has agreed to participate in recommended program. Potentially Note that insurance prior authorization may be required for reimbursement for recommended care.  Comment: Rehab Admissions Coordinator to follow up.   I have personally performed a face to face diagnostic evaluation, including, but not limited to relevant history  and physical exam findings, of this patient and developed relevant assessment and plan.  Additionally, I have reviewed and concur with the physician assistant's documentation above.   Maryla Morrow, MD, ABPMR Mcarthur Rossetti Angiulli, PA-C 06/09/2017

## 2017-06-09 NOTE — Discharge Summary (Addendum)
Discharge Summary  Levi Robinson:[id number] DOB: Mar 20, 1921  PCP: Georgianne Fick, MD  Admit date: 06/05/2017 Discharge date: 06/09/2017   Discharge delayed- Family, when ready to discharge, requests hospital bed. Hospital bed ordered. Case manager assisting. Highly appreciated.  Time spent: 25 minutes  Recommendations for Outpatient Follow-up:  1. Follow-up with PCP 2. Follow up with cardiology 3. Follow-up with [facility] neurology posthospitalization 4. Continue to hold off aspirin and Plavix for at least 3 weeks, only restart with MD permission  Discharge Diagnoses:  Active Hospital Problems   Diagnosis Date Noted  . Subdural hematoma, acute (HCC) 06/05/2017  . PAF (paroxysmal atrial fibrillation) (HCC)   . Benign essential HTN   . Stage 3 chronic kidney disease (HCC)   . Atrial fibrillation with RVR (HCC) 06/05/2017  . Altered mental status 06/05/2017  . Acute CHF (congestive heart failure) (HCC) 06/05/2017  . Fall 06/05/2017  . Uncontrolled diabetes mellitus (HCC) 06/05/2017  . DNR (do not resuscitate) 06/05/2017  . Subdural hematoma (HCC) 06/05/2017  . Hypertension     Resolved Hospital Problems  No resolved problems to display.    Discharge Condition: Stable  Diet recommendation: Mechanical soft thin liquid.  Recommendations by speech therapy: Diet recommendations: Dysphagia 3 (mechanical soft);Thin liquid Liquids provided via: Cup;Straw Medication Administration: Whole meds with puree Supervision: Intermittent supervision to cue for compensatory strategies Compensations: Minimize environmental distractions;Slow rate;Small sips/bites Postural Changes and/or Swallow Maneuvers: Seated upright 90 degrees.   Vitals:   06/09/17 1207 06/09/17 1433  BP: (!) 115/44 (!) 123/54  Pulse: 67 64  Resp: 17 16  Temp: 97.8 F (36.6 C) 98.5 F (36.9 C)  SpO2: 97% 98%    History of present illness:  84 y.o.malewith hx of CAD/ stent, DM2, HTN, MI who  presents after a fall at home today. Family member heard a thud. Brought to ED and eval per head CT showing a significant R SDH with R>L shift. ED MD spoke with family who said that patient wanted no invasive procedures having previously denied carotid surgery. They do now want to pursue surgical options. Asked to see for medical admission.   On admission: Patient was obtunded but now is waking up some and answering simple questions. Family states at baseline he is taking care of himself but does need some help with bathing but not with walking or eating. Home meds include ecasa and plavix and BP meds. Only prior admit here was in 2013 for NSTEMI w CAD underwent PCI to LCx with stent, LVEF was ok, intermittent AMS due to meds, DM, carotid disease, anemia, afib w/ aberrant conduction and HTN.   06/09/2017: Patient seen and examined at his bedside.  He has no new complaints.  He is alert and oriented in the setting of dementia.  Repeat CT head with no contrast done yesterday revealed decrease in the size of the intracranial hematoma.  PT evaluated the patient and recommended CIR. CIR with evaluate for further recommendations.  CIR recommends home health PT.  On the day of discharge, patient was hemodynamically stable.  He will need to follow-up with his primary care provider, [facility], and his cardiologist posthospitalization.  To note patient has no worsening neurological symptoms or signs of seizure activity.    Hospital Course:  Principal Problem:   Subdural hematoma, acute (HCC) Active Problems:   Hypertension   Atrial fibrillation with RVR (HCC)   Altered mental status   Acute CHF (congestive heart failure) (HCC)   Fall   Uncontrolled diabetes  mellitus (HCC)   DNR (do not resuscitate)   Subdural hematoma (HCC)   PAF (paroxysmal atrial fibrillation) (HCC)   Benign essential HTN   Stage 3 chronic kidney disease (HCC)  Fall/right subdural hematoma with midline  shift Continue to hold aspirin and Plavix CT head with no contrast 06/08/2017.  Personally reviewed and revealed mild decrease in the size of the right subdural hematoma and no evidence of new intracranial hemorrhage. Small posttraumatic subdural hygroma noted Right hip x-ray unremarkable for any acute findings Follow-up outpatient with [facility] neurology  Acute metabolic encephalopathy most likely multifactorial secondary to subdural hematoma versus delirium versus others, resolved May consider Seroquel for delirium/agitation PRN nightly  Ambulatory dysfunction PT evaluated and recommended SNF or CIR Caregiver, grandniece, declined SNF Fall precautions in place  Chronic A. fib Rate controlled Not a candidate for anticoagulation due to subdural hematoma Follow-up with cardiology outpatient  Hypertension Blood pressure stable Continue antihypertensive medications  Type 2 diabetes Hemoglobin A1c 9.4 on 06/08/2017 Continue home medications Avoid hypoglycemia  Chronic diastolic CHF Last 2D echo done on 06/29/2011 revealed LVEF 65 to 70% with grade 1 diastolic dysfunction Continue cardiac meds Lasix Coreg Continue strict I's and O's Daily weight  CKD 3 Baseline creatinine 1.5 Avoid nephrotoxic agents/hypotension GFR 44  Coronary artery disease Continue statin Continue to hold off aspirin and Plavix due to subdural hematoma Follow-up with cardiology outpatient   Procedures:  None  Consultations:  CIR  CSW  PT/OT  Speech therapist  Discharge Exam: BP (!) 123/54 (BP Location: Right Arm)   Pulse 64   Temp 98.5 F (36.9 C) (Oral)   Resp 16   Ht 5\' 10"  (1.778 m)   Wt 72.6 kg (160 lb)   SpO2 98%   BMI 22.96 kg/m  . General: 84 y.o. year-old male well developed well nourished in no acute distress.  Alert and interactive. . Cardiovascular: Regular rate and rhythm with no rubs or gallops.  No thyromegaly or JVD noted.   Marland Kitchen Respiratory: Clear to auscultation  with no wheezes or rales. Good inspiratory effort. . Abdomen: Soft nontender nondistended with normal bowel sounds x4 quadrants. . Musculoskeletal: No lower extremity edema. 2/4 pulses in all 4 extremities. Marland Kitchen Psychiatry: Mood is appropriate for condition and setting  Discharge Instructions You were cared for by a hospitalist during your hospital stay. If you have any questions about your discharge medications or the care you received while you were in the hospital after you are discharged, you can call the unit and asked to speak with the hospitalist on call if the hospitalist that took care of you is not available. Once you are discharged, your primary care physician will handle any further medical issues. Please note that NO REFILLS for any discharge medications will be authorized once you are discharged, as it is imperative that you return to your primary care physician (or establish a relationship with a primary care physician if you do not have one) for your aftercare needs so that they can reassess your need for medications and monitor your lab values.  Discharge Instructions    [facility]   Complete by:  As directed    Patient is 84 year old male, with history of coronary artery disease/ stent, DM Type II, HTN and myocardial infarction.  Hisrecent A1c is 9.3- (uncontrolled) and will need medical follow up after discharge in managing it.  Patient's son in-law mentioned the need for further education, information and guidance on patient's  diabetic medications such as insulin use and sliding scale coverage as well as managing hypoglycemia/ hyperglycemia. Need reinforcement with diet restrictions and monitor adherence with diet as well. [name] and great granddaughter have been monitoring/ recording blood sugars for patient and helping patient manage his health issues at home.   Patient's family expressedwillingness to have telephone calls as opposed to  home visits to provide support for chronic disease management at home after discharge.   Referral made to [facility]phoniccare managementcoordinator forfollow-upof needsafter discharge,provide information/health educationand reinforce disease management of DMand other chronic illnesses(HF)at home.   Reason for consult:  Referralto [facility] Telephoniccare managementforfurther assistance in managing chronic health issuesand provide information/healtheducation on ways to further manage DM and other health issues after discharge.   Diagnoses of:  Diabetes   Expected date of contact:  1-3 days (reserved for hospital discharges)   Ambulatory referral to Physical Medicine Rehab   Complete by:  As directed    1 month post TBI follow up     Allergies as of 06/09/2017      Reactions   Ativan [lorazepam] Other (See Comments)   agitation      Medication List    STOP taking these medications   acetaminophen 325 MG tablet Commonly known as:  TYLENOL   aspirin 81 MG tablet   clopidogrel 75 MG tablet Commonly known as:  PLAVIX     TAKE these medications   carvedilol 12.5 MG tablet Commonly known as:  COREG Take 6.25 mg by mouth 2 (two) times daily with a meal.   fish oil-omega-3 fatty acids 1000 MG capsule Take 1 g by mouth 3 (three) times daily.   furosemide 40 MG tablet Commonly known as:  LASIX TAKE 1 TABLET BY MOUTH TWICE A DAY   insulin detemir 100 UNIT/ML injection Commonly known as:  LEVEMIR Inject 48 Units into the skin daily. Inject 16-20 units at dinner if needed.   isosorbide mononitrate 30 MG 24 hr tablet Commonly known as:  IMDUR Take 30 mg by mouth daily after lunch.   ketoconazole 2 % cream Commonly known as:  NIZORAL Apply 1 application topically as needed. For rash   levothyroxine 50 MCG tablet Commonly known as:  SYNTHROID, LEVOTHROID Take 1 tablet (50 mcg total) by mouth daily.   nitroGLYCERIN 0.4 MG SL tablet Commonly known as:   NITROSTAT Place 1 tablet (0.4 mg total) under the tongue every 5 (five) minutes x 3 doses as needed for chest pain.   ondansetron 4 MG disintegrating tablet Commonly known as:  ZOFRAN ODT  ODT q4 hours prn nausea/vomit   pantoprazole 40 MG tablet Commonly known as:  PROTONIX Take 40 mg by mouth daily after lunch.   potassium chloride 10 MEQ tablet Commonly known as:  K-DUR Take 10 mEq by mouth daily. What changed:  Another medication with the same name was removed. Continue taking this medication, and follow the directions you see here.   PROSTATE PO Take 600 mg by mouth daily. Super beta prostate, 600 mg   simvastatin 40 MG tablet Commonly known as:  ZOCOR Take 20 mg by mouth daily. Pt takes 1/2 tablet at bedtime   TIGER BALM LINIMENT EX Apply 1 application topically as needed (neck pain).            Durable Medical Equipment  (From admission, onward)        Start     Ordered   06/09/17 1143  For home use only DME 3 n 1  Once     06/09/17 1142     Allergies  Allergen Reactions  . Ativan [Lorazepam] Other (See Comments)    agitation   Follow-up Information    Georgianne Fick, MD. Call in 2 day(s).   Specialty:  Internal Medicine Why:  Please call for an appointment. Contact information: [address] [place] [address] [phone]        Kathleene Hazel, MD. Call in 1 day(s).   Specialty:  Cardiology Why:  Please call for an appointment. Contact information: [address] [place] [address] [phone]        [facility]. Call in 1 day(s).   Specialty:  Neurology Why:  +++Please call to make an appointment if you don'r receive a call+++ Contact information: [address] [place] [address] [phone]           The results of significant diagnostics from this hospitalization (including imaging, microbiology, ancillary and laboratory) are  listed below for reference.    Significant Diagnostic Studies: Ct Head Wo Contrast  Result Date: 06/08/2017 CLINICAL DATA:  Follow-up of intracranial hemorrhage. EXAM: CT HEAD WITHOUT CONTRAST TECHNIQUE: Contiguous axial images were obtained from the base of the skull through the vertex without intravenous contrast. COMPARISON:  06/05/2017 FINDINGS: Brain: The large right-sided subdural hematoma has mildly decreased in size from the prior exam, more accurately measured on the current study due to less motion artifact. The hemorrhage measures approximately 1.7 cm in greatest thickness. It creates mass effect with flattening of underlying gyri and partial sulcal effacement, and 4 mm of left midline shift. There is partial effacement of the right lateral ventricle. There has been a mild decrease in density in the subdural hemorrhage consistent with the expected normal evolution. There is no evidence of new intracranial hemorrhage. There is widening of the extra-axial space on the left, which is new since the previous examination. This measures 6-7 mm in thickness. No evidence of an ischemic infarct. There is generalized ventricular enlargement consistent with age-appropriate volume loss. No parenchymal masses. Vascular: No hyperdense vessel or unexpected calcification. Skull: Normal. Negative for fracture or focal lesion. Sinuses/Orbits: No acute finding. Other: None. IMPRESSION: 1. Mild decrease in the size of the right subdural hemorrhage with an overall decrease in density consistent with the expected normal evolution. 2. No evidence of new intracranial hemorrhage.  No skin infarct. 3. Widened left-sided extra-axial space suggesting a small posttraumatic subdural hygroma, new since prior study. Electronically Signed   By: Amie Portland M.D.   On: 06/08/2017 09:11   Ct Head Wo Contrast  Result Date: 06/05/2017 CLINICAL DATA:  Altered level of consciousness, fell EXAM: CT HEAD WITHOUT CONTRAST TECHNIQUE:  Contiguous axial images were obtained from the base of the skull through the vertex without intravenous contrast. COMPARISON:  12/19/2016 FINDINGS: Brain: Acute right mixed attenuation subdural hematoma measured up to 2.3cm thickness. This exerts mass effect upon the right cerebral hemisphere resulting in approximately 4 mm midline shift from right to left. There is mild compression of the right lateral ventricle. No hydronephrosis. There is moderate diffuse parenchymal atrophy. Patchy areas of hypoattenuation in deep inferior periventricular white matter bilaterally. No definite intraparenchymal hemorrhage. Acute infarct may be inapparent on noncontrast CT. Vascular: Atherosclerotic and physiologic intracranial calcifications. Skull: Normal. Negative for fracture or focal lesion. Sinuses/Orbits: No acute finding. Other: None IMPRESSION: 1. Large subdural hematoma over the right cerebral convexity resulting in mass effect and 4 mm midline  shift. No hydrocephalus. Critical Value/emergent results were called by telephone at the time of interpretation on 06/05/2017 at 12:13 pm to Dr. Pricilla Loveless , who verbally acknowledged these results. Electronically Signed   By: Corlis Leak M.D.   On: 06/05/2017 12:14   Dg Chest Port 1 View  Result Date: 06/05/2017 CLINICAL DATA:  Hypoxia. EXAM: PORTABLE CHEST 1 VIEW COMPARISON:  With x-ray dated December 19, 2016. FINDINGS: Heart size remains at the upper limits of normal. New diffuse interstitial thickening. Bibasilar atelectasis/scarring, not significantly changed. No significant abnormality identified. Fluid No acute osseous abnormality. IMPRESSION: New diffuse interstitial thickening, favor interstitial pulmonary edema. Electronically Signed   By: Obie Dredge M.D.   On: 06/05/2017 12:43   Dg Hips Bilat With Pelvis 2v  Result Date: 06/07/2017 CLINICAL DATA:  Status post fall. EXAM: DG HIP (WITH OR WITHOUT PELVIS) 2V BILAT COMPARISON:  None. FINDINGS: There is no  evidence of hip fracture or dislocation. Mild bilateral hip joint osteoarthritic changes. Osteoarthritic changes of the lower lumbosacral spine. IMPRESSION: No acute fracture or dislocation identified about the hips or pelvis. Electronically Signed   By: Ted Mcalpine M.D.   On: 06/07/2017 10:23    Microbiology: No results found for this or any previous visit (from the past 240 hour(s)).   Labs: Basic Metabolic Panel: Recent Labs  Lab 06/05/17 1130 06/06/17 0235 06/09/17 0616  NA 139 141 136  K 4.1 3.9 3.7  CL 105 105 100*  CO2 GLUCOSE 325* 198* 189*  BUN 24* 19 19  CREATININE 1.66* 1.50* 1.22  CALCIUM 9.0 9.2 8.6*   Liver Function Tests: Recent Labs  Lab 06/05/17 1130  AST 27  ALT 13*  ALKPHOS 75  BILITOT 0.9  PROT 7.4  ALBUMIN 3.5   No results for input(s): LIPASE, AMYLASE in the last 168 hours. No results for input(s): AMMONIA in the last 168 hours. CBC: Recent Labs  Lab 06/05/17 1130 06/06/17 0235  WBC 6.2 8.6  NEUTROABS  --  6.4  HGB 13.9 14.1  HCT 42.4 43.0  MCV 92.2 92.3  PLT 180 178   Cardiac Enzymes: No results for input(s): CKTOTAL, CKMB, CKMBINDEX, TROPONINI in the last 168 hours. BNP: BNP (last 3 results) No results for input(s): BNP in the last 8760 hours.  ProBNP (last 3 results) No results for input(s): PROBNP in the last 8760 hours.  CBG: Recent Labs  Lab 06/08/17 2012 06/08/17 2327 06/09/17 0502 06/09/17 0751 06/09/17 1046  GLUCAP 209* 281* 205* 158* 219*       Signed:  Darlin Drop, MD [facility] 06/09/2017, 4:10 PM

## 2017-06-09 NOTE — Social Work (Signed)
CSW received verbal consult from RN Case Manager that pt family would like to see SNF options before definitively discharging home with [facility]. Pt faxed out to [facility]. Doy Hutching, LCSWA [facility] Clinical Social Work [phone]

## 2017-06-09 NOTE — Progress Notes (Signed)
Patient being discharged. Discharge instructions were reviewed with patient and family. Family verbalized understanding.

## 2017-06-10 DIAGNOSIS — Z23 Encounter for immunization: Secondary | ICD-10-CM | POA: Diagnosis not present

## 2017-06-10 LAB — GLUCOSE, CAPILLARY
GLUCOSE-CAPILLARY: 191 mg/dL — AB (ref 65–99)
Glucose-Capillary: 202 mg/dL — ABNORMAL HIGH (ref 65–99)
Glucose-Capillary: 133 mg/dL — ABNORMAL HIGH (ref 65–99)
Glucose-Capillary: 209 mg/dL — ABNORMAL HIGH (ref 65–99)

## 2017-06-10 MED ORDER — TRAZODONE HCL 50 MG PO TABS: 25.0000 mg | ORAL_TABLET | Freq: Every evening | ORAL | 0 refills | Status: DC | PRN

## 2017-06-10 NOTE — Progress Notes (Signed)
Physical Therapy Treatment Patient Details Name: Levi Robinson MRN: [id number] DOB: 06-09-1921 Today's Date: 06/10/2017    History of Present Illness Pt is a 84 y.o. male who presented to ED after a fall at home. CT revealing significant R SDH with R>L shift. Awaiting bilateral hip x-ray. PMH significant for history of CAD/stent, DM2, HTN, and MI.    PT Comments    Session focused on caregiver education on proper guarding and use of gait belt to prevent falls at home. Patient currently requires min to mod A during ambulation to provide stability and safety with RW. Granddaughter present this visit and reports confidence in safe guarding.     Follow Up Recommendations  Home health PT;Supervision/Assistance - 24 hour     Equipment Recommendations  None recommended by PT    Recommendations for Other Services       Precautions / Restrictions Precautions Precautions: Fall Restrictions Weight Bearing Restrictions: No    Mobility  Bed Mobility Overal bed mobility: Needs Assistance Bed Mobility: Supine to Sit     Supine to sit: Min guard     General bed mobility comments: min guard for safety; use of rail and increased time/effort  Transfers Overall transfer level: Needs assistance Equipment used: Rolling walker (2 wheeled) Transfers: Sit to/from Stand Sit to Stand: Min assist            Ambulation/Gait Ambulation/Gait assistance: Min assist;Mod assist Ambulation Distance (Feet): 50 Feet Assistive device: Rolling walker (2 wheeled);1 person hand held assist   Gait velocity: slowed   General Gait Details: mod A for stability during session, patient unable to demo safety with RW allowing it to travel far infront. grand daugther present discussed proper guarding and use of gait belt to reduce risk of falls.    Stairs             Wheelchair Mobility    Modified Rankin (Stroke Patients Only)       Balance Overall balance assessment: Needs  assistance Sitting-balance support: No upper extremity supported;Feet supported Sitting balance-Leahy Scale: Fair     Standing balance support: Bilateral upper extremity supported Standing balance-Leahy Scale: Poor                              Cognition Arousal/Alertness: Awake/alert Behavior During Therapy: Flat affect Overall Cognitive Status: Impaired/Different from baseline Area of Impairment: Attention;Following commands;Problem solving;Orientation;Memory;Safety/judgement;Awareness                 Orientation Level: Disoriented to;Time;Situation Current Attention Level: Sustained Memory: Decreased short-term memory Following Commands: Follows one step commands with increased time;Follows one step commands consistently Safety/Judgement: Decreased awareness of safety Awareness: Intellectual Problem Solving: Slow processing;Decreased initiation;Requires verbal cues        Exercises      General Comments        Pertinent Vitals/Pain Pain Assessment: No/denies pain Faces Pain Scale: No hurt    Home Living                      Prior Function            PT Goals (current goals can now be found in the care plan section) Acute Rehab PT Goals PT Goal Formulation: With patient/family Time For Goal Achievement: 06/21/17 Potential to Achieve Goals: Fair Progress towards PT goals: Progressing toward goals    Frequency    Min 4X/week      PT Plan  Co-evaluation              AM-PAC PT "6 Clicks" Daily Activity  Outcome Measure  Difficulty turning over in bed (including adjusting bedclothes, sheets and blankets)?: A Lot Difficulty moving from lying on back to sitting on the side of the bed? : Unable Difficulty sitting down on and standing up from a chair with arms (e.g., wheelchair, bedside commode, etc,.)?: Unable Help needed moving to and from a bed to chair (including a wheelchair)?: A Little Help needed walking in  hospital room?: A Little Help needed climbing 3-5 steps with a railing? : A Lot 6 Click Score: 12    End of Session Equipment Utilized During Treatment: Gait belt Activity Tolerance: Patient tolerated treatment well Patient left: with call bell/phone within reach;with family/visitor present;in chair Nurse Communication: Mobility status PT Visit Diagnosis: Other abnormalities of gait and mobility (R26.89);Muscle weakness (generalized) (M62.81);History of falling (Z91.81);Difficulty in walking, not elsewhere classified (R26.2)     Time: 1610-9604 PT Time Calculation (min) (ACUTE ONLY): 21 min  Charges:  $Gait Training: 8-22 mins                    G Codes:       Etta Grandchild, PT, DPT Acute Rehab Services Pager: [phone]     Etta Grandchild 06/10/2017, 4:43 PM

## 2017-06-10 NOTE — Progress Notes (Signed)
Patient 's discharge delayed yesterday due to awaiting equipment to be delivered to home. I have met with granddaughter and case manager at bedside, dme and home health set up today, patient is ready to go home today.   trazodone prn for insomnia prescribed per family request [facility] referral per family request.

## 2017-06-10 NOTE — Progress Notes (Signed)
OT Cancellation Note  Patient Details Name: HARM JOU MRN: [id number] DOB: 13-Oct-1921   Cancelled Treatment:    Reason Eval/Treat Not Completed: Fatigue/lethargy limiting ability to participate. Pt sleeping heavily upon arrival and grand daughter stated that he needed to continue to rest due not sleeping well for several days and that he has had a "sleeping pill" earlier today. Pt scheduled to d/c home this afternoon per family and RN  Galen Manila 06/10/2017, 2:10 PM

## 2017-06-10 NOTE — Progress Notes (Signed)
Patient discharged home. Discharge instructions were reviewed with patient and daughter. Patient and daughter verbalized understanding.

## 2017-06-10 NOTE — Progress Notes (Signed)
Patients granddaughter states the patient is getting a bath at this time and after the bath he will eat and then he will be ready for his 12:00 noon insulin. Patients granddaughter also states she have found that when his blood sugars run a little high the patient does better.

## 2017-06-11 DIAGNOSIS — R278 Other lack of coordination: Secondary | ICD-10-CM | POA: Diagnosis not present

## 2017-06-11 DIAGNOSIS — R296 Repeated falls: Secondary | ICD-10-CM | POA: Diagnosis not present

## 2017-06-11 DIAGNOSIS — Z79899 Other long term (current) drug therapy: Secondary | ICD-10-CM | POA: Diagnosis not present

## 2017-06-11 NOTE — Telephone Encounter (Signed)
This encounter was created in error - please disregard.

## 2017-06-12 DIAGNOSIS — R278 Other lack of coordination: Secondary | ICD-10-CM | POA: Diagnosis not present

## 2017-06-12 DIAGNOSIS — Z79899 Other long term (current) drug therapy: Secondary | ICD-10-CM | POA: Diagnosis not present

## 2017-06-12 DIAGNOSIS — R296 Repeated falls: Secondary | ICD-10-CM | POA: Diagnosis not present

## 2017-06-15 ENCOUNTER — Emergency Department (HOSPITAL_COMMUNITY): Payer: Medicare Other

## 2017-06-15 ENCOUNTER — Other Ambulatory Visit: Payer: Self-pay

## 2017-06-15 ENCOUNTER — Emergency Department (HOSPITAL_COMMUNITY)
Admission: EM | Admit: 2017-06-15 | Discharge: 2017-06-15 | Disposition: A | Discharge status: Home / Self Care | Payer: Medicare Other | Attending: Emergency Medicine | Admitting: Emergency Medicine

## 2017-06-15 ENCOUNTER — Encounter (HOSPITAL_COMMUNITY): Payer: Self-pay | Admitting: Emergency Medicine

## 2017-06-15 DIAGNOSIS — Z87891 Personal history of nicotine dependence: Secondary | ICD-10-CM | POA: Insufficient documentation

## 2017-06-15 DIAGNOSIS — E1122 Type 2 diabetes mellitus with diabetic chronic kidney disease: Secondary | ICD-10-CM | POA: Insufficient documentation

## 2017-06-15 DIAGNOSIS — R402441 Other coma, without documented Glasgow coma scale score, or with partial score reported, in the field [EMT or ambulance]: Secondary | ICD-10-CM | POA: Diagnosis not present

## 2017-06-15 DIAGNOSIS — I252 Old myocardial infarction: Secondary | ICD-10-CM | POA: Insufficient documentation

## 2017-06-15 DIAGNOSIS — I509 Heart failure, unspecified: Secondary | ICD-10-CM | POA: Diagnosis not present

## 2017-06-15 DIAGNOSIS — N183 Chronic kidney disease, stage 3 (moderate): Secondary | ICD-10-CM | POA: Insufficient documentation

## 2017-06-15 DIAGNOSIS — I6203 Nontraumatic chronic subdural hemorrhage: Secondary | ICD-10-CM | POA: Diagnosis not present

## 2017-06-15 DIAGNOSIS — R569 Unspecified convulsions: Secondary | ICD-10-CM | POA: Diagnosis not present

## 2017-06-15 DIAGNOSIS — I6200 Nontraumatic subdural hemorrhage, unspecified: Secondary | ICD-10-CM | POA: Diagnosis not present

## 2017-06-15 DIAGNOSIS — I2510 Atherosclerotic heart disease of native coronary artery without angina pectoris: Secondary | ICD-10-CM | POA: Insufficient documentation

## 2017-06-15 DIAGNOSIS — E162 Hypoglycemia, unspecified: Secondary | ICD-10-CM | POA: Diagnosis not present

## 2017-06-15 DIAGNOSIS — I491 Atrial premature depolarization: Secondary | ICD-10-CM | POA: Diagnosis not present

## 2017-06-15 DIAGNOSIS — I6201 Nontraumatic acute subdural hemorrhage: Secondary | ICD-10-CM | POA: Diagnosis not present

## 2017-06-15 DIAGNOSIS — I130 Hypertensive heart and chronic kidney disease with heart failure and stage 1 through stage 4 chronic kidney disease, or unspecified chronic kidney disease: Secondary | ICD-10-CM | POA: Diagnosis not present

## 2017-06-15 DIAGNOSIS — E161 Other hypoglycemia: Secondary | ICD-10-CM | POA: Diagnosis not present

## 2017-06-15 DIAGNOSIS — N289 Disorder of kidney and ureter, unspecified: Secondary | ICD-10-CM | POA: Diagnosis not present

## 2017-06-15 DIAGNOSIS — S065X9A Traumatic subdural hemorrhage with loss of consciousness of unspecified duration, initial encounter: Secondary | ICD-10-CM

## 2017-06-15 DIAGNOSIS — Z79899 Other long term (current) drug therapy: Secondary | ICD-10-CM | POA: Diagnosis not present

## 2017-06-15 DIAGNOSIS — D649 Anemia, unspecified: Secondary | ICD-10-CM | POA: Diagnosis not present

## 2017-06-15 DIAGNOSIS — R4182 Altered mental status, unspecified: Secondary | ICD-10-CM

## 2017-06-15 DIAGNOSIS — R410 Disorientation, unspecified: Secondary | ICD-10-CM | POA: Diagnosis not present

## 2017-06-15 LAB — APTT: APTT: 35 s (ref 24–36)

## 2017-06-15 LAB — COMPREHENSIVE METABOLIC PANEL
ALBUMIN: 3.1 g/dL — AB (ref 3.5–5.0)
ALT: 13 U/L — ABNORMAL LOW (ref 17–63)
ANION GAP: 9 (ref 5–15)
AST: 21 U/L (ref 15–41)
Alkaline Phosphatase: 60 U/L (ref 38–126)
BUN: 19 mg/dL (ref 6–20)
CHLORIDE: 102 mmol/L (ref 101–111)
CO2: 22 mmol/L (ref 22–32)
Calcium: 8.5 mg/dL — ABNORMAL LOW (ref 8.9–10.3)
Creatinine, Ser: 1.62 mg/dL — ABNORMAL HIGH (ref 0.61–1.24)
GFR calc Af Amer: 41 mL/min — ABNORMAL LOW (ref >60)
GFR calc non Af Amer: 35 mL/min — ABNORMAL LOW (ref >60)
GLUCOSE: 107 mg/dL — AB (ref 65–99)
POTASSIUM: 3.7 mmol/L (ref 3.5–5.1)
Sodium: 133 mmol/L — ABNORMAL LOW (ref 135–145)
Total Bilirubin: 0.7 mg/dL (ref 0.3–1.2)
Total Protein: 7.1 g/dL (ref 6.5–8.1)

## 2017-06-15 LAB — CBG MONITORING, ED
Glucose-Capillary: 100 mg/dL — ABNORMAL HIGH (ref 65–99)
Glucose-Capillary: 55 mg/dL — ABNORMAL LOW (ref 65–99)
Glucose-Capillary: 74 mg/dL (ref 65–99)
Glucose-Capillary: 168 mg/dL — ABNORMAL HIGH (ref 65–99)

## 2017-06-15 LAB — I-STAT TROPONIN, ED: TROPONIN I, POC: 0.1 ng/mL — AB (ref 0.00–0.08)

## 2017-06-15 LAB — DIFFERENTIAL
ABS IMMATURE GRANULOCYTES: 0 10*3/uL (ref 0.0–0.1)
BASOS ABS: 0 10*3/uL (ref 0.0–0.1)
BASOS PCT: 1 %
EOS ABS: 0.1 10*3/uL (ref 0.0–0.7)
EOS PCT: 2 %
IMMATURE GRANULOCYTES: 0 %
Lymphocytes Relative: 29 %
Lymphs Abs: 1.9 10*3/uL (ref 0.7–4.0)
Monocytes Absolute: 0.6 10*3/uL (ref 0.1–1.0)
Monocytes Relative: 8 %
NEUTROS PCT: 60 %
Neutro Abs: 4 10*3/uL (ref 1.7–7.7)

## 2017-06-15 LAB — CBC
HCT: 35.6 % — ABNORMAL LOW (ref 39.0–52.0)
Hemoglobin: 12 g/dL — ABNORMAL LOW (ref 13.0–17.0)
MCH: 31.2 pg (ref 26.0–34.0)
MCHC: 33.7 g/dL (ref 30.0–36.0)
MCV: 92.5 fL (ref 78.0–100.0)
PLATELETS: 235 10*3/uL (ref 150–400)
RBC: 3.85 MIL/uL — AB (ref 4.22–5.81)
RDW: 13 % (ref 11.5–15.5)
WBC: 6.6 10*3/uL (ref 4.0–10.5)

## 2017-06-15 LAB — PROTIME-INR
INR: 1.13
PROTHROMBIN TIME: 14.4 s (ref 11.4–15.2)

## 2017-06-15 LAB — I-STAT CHEM 8, ED
BUN: 23 mg/dL — AB (ref 6–20)
CALCIUM ION: 1.03 mmol/L — AB (ref 1.15–1.40)
CHLORIDE: 99 mmol/L — AB (ref 101–111)
Creatinine, Ser: 1.5 mg/dL — ABNORMAL HIGH (ref 0.61–1.24)
Glucose, Bld: 105 mg/dL — ABNORMAL HIGH (ref 65–99)
HCT: 37 % — ABNORMAL LOW (ref 39.0–52.0)
Hemoglobin: 12.6 g/dL — ABNORMAL LOW (ref 13.0–17.0)
Potassium: 3.8 mmol/L (ref 3.5–5.1)
SODIUM: 135 mmol/L (ref 135–145)
TCO2: 22 mmol/L (ref 22–32)

## 2017-06-15 LAB — ETHANOL: Alcohol, Ethyl (B): <10 mg/dL (ref <10)

## 2017-06-15 MED ORDER — IOPAMIDOL (ISOVUE-370) INJECTION 76%
INTRAVENOUS | Status: AC
  Filled 2017-06-15: qty 50

## 2017-06-15 MED ORDER — ACETAMINOPHEN 500 MG PO TABS
1000.0000 mg | ORAL_TABLET | Freq: Once | ORAL | Status: AC
  Administered 2017-06-15: 1000 mg via ORAL
  Filled 2017-06-15: qty 2

## 2017-06-15 MED ORDER — LEVETIRACETAM 500 MG PO TABS: 500.0000 mg | ORAL_TABLET | Freq: Two times a day (BID) | ORAL | 0 refills | Status: DC

## 2017-06-15 MED ORDER — MAGNESIUM SULFATE 2 GM/50ML IV SOLN
2.0000 g | Freq: Once | INTRAVENOUS | Status: DC
Start: 2017-06-15 — End: 2017-06-15

## 2017-06-15 MED ORDER — LEVETIRACETAM IN NACL 1000 MG/100ML IV SOLN
1000.0000 mg | INTRAVENOUS | Status: AC
  Administered 2017-06-15: 1000 mg via INTRAVENOUS
  Filled 2017-06-15: qty 100

## 2017-06-15 MED ORDER — LEVETIRACETAM IN NACL 500 MG/100ML IV SOLN
500.0000 mg | Freq: Two times a day (BID) | INTRAVENOUS | Status: DC
  Filled 2017-06-15: qty 100

## 2017-06-15 NOTE — ED Notes (Signed)
Breakfast tray ordered 

## 2017-06-15 NOTE — ED Triage Notes (Signed)
Patient is from home, was last seen normal around 2200 last night.  Patient with right sided facial droop upon arrival to ED.  Patient having repetitive words, sometimes garbled.  EMS states that patient with low blood sugar upon their arrival, 65.  Patient was given 250ml of Dextrose 10 en route to ED.  Patient has been off his plavix since a fall on 06/05/17.

## 2017-06-15 NOTE — ED Provider Notes (Signed)
ttPatient accepted for Dr. Preston FleetingGlick at shift change.  Plan is for neurosurgery consultation and final disposition per consultation.  Neurosurgery has reviewed and no plans for interventional management.  Neurology had EEG scheduled.  This is still pending.  I have reviewed these things with the patient's daughter and will discuss final disposition after neurology evaluation is completed. Physical Exam  BP (!) 148/65   Pulse 66   Resp 16   SpO2 100%   Physical Exam  Constitutional: No distress.  Pulmonary/Chest: Effort normal.  Neurological: He is alert.    ED Course/Procedures     Procedures Consult: (09: 30) reviewed with neurology.  Will review EEG results and update me as to plan based on results and review of chart.  After Otelia LimesLindzen has reviewed the EEG.  Patient does not require admission to the hospital.  Appropriate will be for starting him on twice daily Keppra and close follow-up with neurology. MDM         Arby BarrettePfeiffer, Renessa Wellnitz, MD 06/15/17 [phone]

## 2017-06-15 NOTE — ED Notes (Signed)
Re-paged neuro surg 

## 2017-06-15 NOTE — ED Notes (Signed)
2 18 g IVs removed catheters intact

## 2017-06-15 NOTE — Discharge Instructions (Addendum)
1.  Start taking Keppra 500 mg twice daily.  Schedule a follow-up appointment with [facility] as soon as possible.

## 2017-06-15 NOTE — Consult Note (Signed)
BP 121/60   Pulse 67   Resp (!) 21   SpO2 98%  I have spoken with the family. They were of the understanding that the subdural blood was new, and that he was still bleeding. He is not continuing to bleed, this is simple maturation of the already present hematoma. There is ample space around the basal cisterns. The family clearly states they never contemplated surgery, nor would they consent to surgery. Thus there is no consideration of for surgery considering his age and most importantly those that would give consent would never grant it.  In the future unless the family states they want a head ct repeated, and it be made clear the reason for the head ct with their clear understanding, none should be ordered.  [name] has not fallen since discharge.  Allergies  Allergen Reactions  . Ativan [Lorazepam] Other (See Comments)    agitation  . Fentanyl Nausea And Vomiting   Past Medical History:  Diagnosis Date  . Anemia    a. Felt to be iatrogenic from procedures/sticks 06/2011  . Arthritis   . CAD (coronary artery disease)    a. NSTEMI s/p complex PCI to prox Cx 07/03/11  . Carotid arterial disease (HCC)    a. By MRI 05/2011;  b. 06/2011 Carotid U/S: LICA 80-99%, RICA 100%, patent vertebrals.  . Diabetes mellitus   . Encephalopathy    AMS during 06/2011 hospitalization - MRI of brain without infarct but did show R carotid dz, abnl EEG with nonspecific encephalopathy, felt secondary to drugs  . Hypertension   . Myocardial infarction (HCC)   . Transient atrial fibrillation or flutter    Brief WCT during hospitalization 06/2011 felt to be afib with abberation, not a good coumadin candidate   Past Surgical History:  Procedure Laterality Date  . APPENDECTOMY  1948  . LEFT HEART CATHETERIZATION WITH CORONARY ANGIOGRAM N/A 07/01/2011   Procedure: LEFT HEART CATHETERIZATION WITH CORONARY ANGIOGRAM;  Surgeon: Herby Abrahamhomas D Stuckey, MD;  [facility];  Service: Cardiovascular;  Laterality: N/A;   . PERCUTANEOUS CORONARY STENT INTERVENTION (PCI-S) N/A 07/03/2011   Procedure: PERCUTANEOUS CORONARY STENT INTERVENTION (PCI-S);  Surgeon: Herby Abrahamhomas D Stuckey, MD;  [facility];  Service: Cardiovascular;  Laterality: N/A;  . thumb surgery  1975   left   Ct Head Code Stroke Wo Contrast  Result Date: 06/15/2017 CLINICAL DATA:  Code stroke. Aphasia, hypotensive. Last seen normal at 2200 hours. History of encephalopathy, subdural hematoma, carotid artery disease. EXAM: CT HEAD WITHOUT CONTRAST TECHNIQUE: Contiguous axial images were obtained from the base of the skull through the vertex without intravenous contrast. COMPARISON:  CT HEAD Jun 08, 2017 FINDINGS: BRAIN: Predominately isodense RIGHT holohemispheric subdural hematoma was 1.7 cm, now 2.1 cm with worsening mass effect. LEFT holohemispheric low-density extra-axial collection was 6 mm, now 10 mm. 3 mm RIGHT to LEFT midline shift. Mildly effaced RIGHT lateral ventricle with slightly increased LEFT lateral ventricle entrapment. Patchy white matter hypodensities compatible with chronic small vessel ischemic changes. No intraparenchymal hemorrhage. No acute large vascular territory infarct. Basal cisterns are patent. VASCULAR: Severe calcific atherosclerosis of the carotid siphons. SKULL: No skull fracture. No significant scalp soft tissue swelling. SINUSES/ORBITS: The mastoid air-cells and included paranasal sinuses are well-aerated.The included ocular globes and orbital contents are non-suspicious. OTHER: None. IMPRESSION: 1. Enlarging 2.1 cm RIGHT subdural hematoma resulting in early LEFT ventricle entrapment. 2. Enlarging LEFT 1 cm hygroma. 3. Critical Value/emergent results text paged to Dr.Arora, Neurology via [facility]  secure system on 06/15/2017 at 4:04 am, including interpreting physician's phone number. Electronically Signed   By: Awilda Metro M.D.   On: 06/15/2017 04:06  moves extremities, not following commands. A/P I have spoken with the  family and their desires are quite clear, there will be no surgery. They know that they can contact me in the future if they have questions. I have taken care of family members previously.

## 2017-06-15 NOTE — ED Provider Notes (Signed)
[facility] EMERGENCY DEPARTMENT Provider Note   CSN: [id number] Arrival date & time: 06/15/17  0346     History   Chief Complaint No chief complaint on file.   HPI Levi Robinson is a 84 y.o. male.  The history is provided by the EMS personnel. The history is limited by the condition of the patient (Altered mental status).  He has a history of hypertension, paroxysmal atrial fibrillation, diabetes, coronary artery disease and was brought to the ED as a possible code stroke.  EMS was called to the home because of low blood sugar.  Family found the patient with altered mentation and home glucose was noted to be 55.  They attempted to give oral juice and food which she was not excepting.  On arrival, EMS reports glucose of 65.  They gave D10 with immediate improvement in mental status, but he was noted to be speaking gibberish.  They observed him at home but he did not seem to be improving, so they transported him to the ED.  He was not cooperating with exam very well, but he did seem to have equal grip strength and seems to be moving arms and legs equally.  Of note, he had been discharged from the hospital about 5 days ago following a fall with resultant subdural hematoma.  Past Medical History:  Diagnosis Date  . Anemia    a. Felt to be iatrogenic from procedures/sticks 06/2011  . Arthritis   . CAD (coronary artery disease)    a. NSTEMI s/p complex PCI to prox Cx 07/03/11  . Carotid arterial disease (HCC)    a. By MRI 05/2011;  b. 06/2011 Carotid U/S: LICA 80-99%, RICA 100%, patent vertebrals.  . Diabetes mellitus   . Encephalopathy    AMS during 06/2011 hospitalization - MRI of brain without infarct but did show R carotid dz, abnl EEG with nonspecific encephalopathy, felt secondary to drugs  . Hypertension   . Myocardial infarction (HCC)   . Transient atrial fibrillation or flutter    Brief WCT during hospitalization 06/2011 felt to be afib with abberation, not a good  coumadin candidate    Patient Active Problem List   Diagnosis Date Noted  . PAF (paroxysmal atrial fibrillation) (HCC)   . Benign essential HTN   . Stage 3 chronic kidney disease (HCC)   . Atrial fibrillation with RVR (HCC) 06/05/2017  . Subdural hematoma, acute (HCC) 06/05/2017  . Altered mental status 06/05/2017  . Acute CHF (congestive heart failure) (HCC) 06/05/2017  . Fall 06/05/2017  . Uncontrolled diabetes mellitus (HCC) 06/05/2017  . DNR (do not resuscitate) 06/05/2017  . Subdural hematoma (HCC) 06/05/2017  . Occlusion and stenosis of carotid artery without mention of cerebral infarction 08/04/2011  . Hypertension   . CAD (coronary artery disease)   . Carotid arterial disease (HCC)   . Anemia   . Posterior vitreous detachment 06/05/2011  . Status post intraocular lens implant 06/05/2011  . Branch retinal vein occlusion 12/12/2010  . Nonproliferative diabetic retinopathy (HCC) 12/12/2010    Past Surgical History:  Procedure Laterality Date  . APPENDECTOMY  1948  . LEFT HEART CATHETERIZATION WITH CORONARY ANGIOGRAM N/A 07/01/2011   Procedure: LEFT HEART CATHETERIZATION WITH CORONARY ANGIOGRAM;  Surgeon: Herby Abraham, MD;  Location: [facility] CATH LAB;  Service: Cardiovascular;  Laterality: N/A;  . PERCUTANEOUS CORONARY STENT INTERVENTION (PCI-S) N/A 07/03/2011   Procedure: PERCUTANEOUS CORONARY STENT INTERVENTION (PCI-S);  Surgeon: Herby Abraham, MD;  [facility] C[facility] LAB;  Service: Cardiovascular;  Laterality: N/A;  . thumb surgery  1975   left        Home Medications    Prior to Admission medications   Medication Sig Start Date End Date Taking? Authorizing Provider  carvedilol (COREG) 12.5 MG tablet Take 6.25 mg by mouth 2 (two) times daily with a meal.    [provider]  fish oil-omega-3 fatty acids 1000 MG capsule Take 1 g by mouth 3 (three) times daily.     [provider]  furosemide (LASIX) 40 MG tablet TAKE 1 TABLET BY MOUTH TWICE A DAY  04/24/17   Kathleene HazelMcAlhany, Christopher D, MD  insulin detemir (LEVEMIR) 100 UNIT/ML injection Inject 48 Units into the skin daily. Inject 16-20 units at dinner if needed. 07/04/11   Dunn, Tacey Ruizayna N, PA-C  isosorbide mononitrate (IMDUR) 30 MG 24 hr tablet Take 30 mg by mouth daily after lunch. 07/04/11   Dunn, Tacey Ruizayna N, PA-C  ketoconazole (NIZORAL) 2 % cream Apply 1 application topically as needed. For rash 07/28/11   [provider]  levothyroxine (SYNTHROID, LEVOTHROID) 50 MCG tablet Take 1 tablet (50 mcg total) by mouth daily. 06/22/14   Kathleene HazelMcAlhany, Christopher D, MD  Menthol-Methyl Salicylate (TIGER BALM LINIMENT EX) Apply 1 application topically as needed (neck pain).    [provider]  nitroGLYCERIN (NITROSTAT) 0.4 MG SL tablet Place 1 tablet (0.4 mg total) under the tongue every 5 (five) minutes x 3 doses as needed for chest pain. 07/04/11   Laurann Montanaunn, Dayna N, PA-C  ondansetron (ZOFRAN ODT) 4 MG disintegrating tablet 4mg  ODT q4 hours prn nausea/vomit 04/15/17   Melene PlanFloyd, Dan, DO  pantoprazole (PROTONIX) 40 MG tablet Take 40 mg by mouth daily after lunch.     [provider]  potassium chloride (K-DUR) 10 MEQ tablet Take 10 mEq by mouth daily.  11/07/13   [provider]  simvastatin (ZOCOR) 40 MG tablet Take 20 mg by mouth daily. Pt takes 1/2 tablet at bedtime     [provider]  Specialty Vitamins Products (PROSTATE PO) Take 600 mg by mouth daily. Super beta prostate, 600 mg     [provider]  traZODone (DESYREL) 50 MG tablet Take 0.5 tablets (25 mg total) by mouth at bedtime as needed for sleep. 06/10/17   Albertine GratesXu, Fang, MD    Family History Family History  Problem Relation Age of Onset  . Diabetes Mother   . Cancer Father   . Cancer Sister   . Cancer Brother   . Cancer Daughter     Social History Social History   Tobacco Use  . Smoking status: Former Smoker    Types: Cigarettes    Last attempt to quit: 01/13/1986    Years since quitting: 31.4  .  Smokeless tobacco: Never Used  Substance Use Topics  . Alcohol use: No  . Drug use: No     Allergies   Ativan [lorazepam]   Review of Systems Review of Systems  Unable to perform ROS: Mental status change     Physical Exam Updated Vital Signs BP 121/60   Pulse 67   Resp (!) 21   SpO2 98%   Physical Exam  Nursing note and vitals reviewed.  84 year old male, resting comfortably and in no acute distress. Vital signs are significant for borderline tachypnea. Oxygen saturation is 98%, which is normal. Head is normocephalic and atraumatic. PERRLA,EOMI. Oropharynx is clear. Neck is nontender and supple without adenopathy or JVD. Back is nontender  and there is no CVA tenderness. Lungs are clear without rales, wheezes, or rhonchi. Chest is nontender. Heart has regular rate and rhythm without murmur. Abdomen is soft, flat, nontender without masses or hepatosplenomegaly and peristalsis is normoactive. Extremities have no cyanosis or edema, full range of motion is present.   Skin is warm and dry without rash. Neurologic: Awake and oriented to person but not place or time.  He will repeat some things, but not perfectly.  He will not follow commands.  He moves both arms and both legs equally without any obvious weakness.  Questionable right central facial droop.  ED Treatments / Results  Labs (all labs ordered are listed, but only abnormal results are displayed) Labs Reviewed  CBC - Abnormal; Notable for the following components:      Result Value   RBC 3.85 (*)    Hemoglobin 12.0 (*)    HCT 35.6 (*)    All other components within normal limits  COMPREHENSIVE METABOLIC PANEL - Abnormal; Notable for the following components:   Sodium 133 (*)    Glucose, Bld 107 (*)    Creatinine, Ser 1.62 (*)    Calcium 8.5 (*)    Albumin 3.1 (*)    ALT 13 (*)    GFR calc non Af Amer 35 (*)    GFR calc Af Amer 41 (*)    All other components within normal limits  CBG MONITORING, ED -  Abnormal; Notable for the following components:   Glucose-Capillary 100 (*)    All other components within normal limits  I-STAT CHEM 8, ED - Abnormal; Notable for the following components:   Chloride 99 (*)    BUN 23 (*)    Creatinine, Ser 1.50 (*)    Glucose, Bld 105 (*)    Calcium, Ion 1.03 (*)    Hemoglobin 12.6 (*)    HCT 37.0 (*)    All other components within normal limits  I-STAT TROPONIN, ED - Abnormal; Notable for the following components:   Troponin i, poc 0.10 (*)    All other components within normal limits  ETHANOL  PROTIME-INR  APTT  DIFFERENTIAL  RAPID URINE DRUG SCREEN, HOSP PERFORMED  URINALYSIS, ROUTINE W REFLEX MICROSCOPIC    EKG EKG Interpretation  Date/Time:  Monday June 15 2017 04:12:10 EDT Ventricular Rate:  80 PR Interval:    QRS Duration: 134 QT Interval:  421 QTC Calculation: 486 R Axis:   -89 Text Interpretation:  Sinus rhythm RBBB and LAFB LVH by voltage When compared with ECG of 06/05/2017, No significant change was found Confirmed by Dione Booze ([id number]) on 06/15/2017 4:46:00 AM   Radiology Ct Head Code Stroke Wo Contrast  Result Date: 06/15/2017 CLINICAL DATA:  Code stroke. Aphasia, hypotensive. Last seen normal at 2200 hours. History of encephalopathy, subdural hematoma, carotid artery disease. EXAM: CT HEAD WITHOUT CONTRAST TECHNIQUE: Contiguous axial images were obtained from the base of the skull through the vertex without intravenous contrast. COMPARISON:  CT HEAD Jun 08, 2017 FINDINGS: BRAIN: Predominately isodense RIGHT holohemispheric subdural hematoma was 1.7 cm, now 2.1 cm with worsening mass effect. LEFT holohemispheric low-density extra-axial collection was 6 mm, now 10 mm. 3 mm RIGHT to LEFT midline shift. Mildly effaced RIGHT lateral ventricle with slightly increased LEFT lateral ventricle entrapment. Patchy white matter hypodensities compatible with chronic small vessel ischemic changes. No intraparenchymal hemorrhage. No acute large  vascular territory infarct. Basal cisterns are patent. VASCULAR: Severe calcific atherosclerosis of the carotid siphons. SKULL: No skull fracture.  No significant scalp soft tissue swelling. SINUSES/ORBITS: The mastoid air-cells and included paranasal sinuses are well-aerated.The included ocular globes and orbital contents are non-suspicious. OTHER: None. IMPRESSION: 1. Enlarging 2.1 cm RIGHT subdural hematoma resulting in early LEFT ventricle entrapment. 2. Enlarging LEFT 1 cm hygroma. 3. Critical Value/emergent results text paged to Dr.Arora, Neurology via [facility] secure system on 06/15/2017 at 4:04 am, including interpreting physician's phone number. Electronically Signed   By: Awilda Metro M.D.   On: 06/15/2017 04:06    Procedures Procedures  CRITICAL CARE Performed by: Dione Booze Total critical care time: 65 minutes Critical care time was exclusive of separately billable procedures and treating other patients. Critical care was necessary to treat or prevent imminent or life-threatening deterioration. Critical care was time spent personally by me on the following activities: development of treatment plan with patient and/or surrogate as well as nursing, discussions with consultants, evaluation of patient's response to treatment, examination of patient, obtaining history from patient or surrogate, ordering and performing treatments and interventions, ordering and review of laboratory studies, ordering and review of radiographic studies, pulse oximetry and re-evaluation of patient's condition.  Medications Ordered in ED Medications  iopamidol (ISOVUE-370) 76 % injection (has no administration in time range)  iopamidol (ISOVUE-370) 76 % injection (has no administration in time range)  levETIRAcetam (KEPPRA) IVPB 500 mg/100 mL premix (has no administration in time range)  levETIRAcetam (KEPPRA) IVPB 1000 mg/100 mL premix (0 mg Intravenous Stopped 06/15/17 0445)     Initial Impression / Assessment  and Plan / ED Course  I have reviewed the triage vital signs and the nursing notes.  Pertinent labs & imaging results that were available during my care of the patient were reviewed by me and considered in my medical decision making (see chart for details).  Aphasia with both expressive and receptive components.  This might be due to stroke, but may also be due to encephalopathy from hypoglycemia.  Also, with recent subdural hematoma, consider worsening of same.  CBG in the emergency department is adequate at 100.  He is started on the stroke protocol.  Old records are reviewed confirming recent hospital admission for subdural hematoma.  CT scan shows enlarging right subdural hematoma, and also increasing size of left sided hygroma.  There is some midline shift.  He is started on levetiracetam.  Neurosurgery will need to be consulted.    Family has arrived and they state that they found the patient on the floor, but do not feel that he had a significant fall.  Speech was altered at this time.  They confirm that he is DNR, but are unsure about whether they would wish to pursue bur hole.  Case is discussed with Dr. Mikal Plane, on-call for neurosurgery, who agrees to come to evaluate the patient.   Final Clinical Impressions(s) / ED Diagnoses   Final diagnoses:  Acute on chronic intracranial subdural hematoma (HCC)  Renal insufficiency  Normochromic normocytic anemia    ED Discharge Orders    None       Dione Booze, MD 06/15/17 [phone]

## 2017-06-15 NOTE — ED Notes (Signed)
Patient remains in eeg

## 2017-06-15 NOTE — Procedures (Signed)
ELECTROENCEPHALOGRAM REPORT  Date of Study: 06/15/2017  Patient's Name: Levi Robinson MRN: [id number] Date of Birth: 1921-09-28  Referring Provider: Dr. Arby BarretteMarcy Pfeiffer  Clinical History: This is a 84 year old man with altered mental status.  Medications: Keppra  Technical Summary: A multichannel digital EEG recording measured by the international 10-20 system with electrodes applied with paste and impedances below 5000 ohms performed as portable with EKG monitoring in a predominantly drowsy and asleep patient.  Hyperventilation and photic stimulation were not performed.  The digital EEG was referentially recorded, reformatted, and digitally filtered in a variety of bipolar and referential montages for optimal display.   Description: The patient is predominantly drowsy and asleep during the recording.  During brief period of wakefulness, he is noted to be confused. There is no clear posterior dominant rhythm seen. The background consists of a moderate amount of diffuse 4-5 Hz theta slowing.  During drowsiness and sleep, there is an increase in theta and delta slowing of the background with vertex waves seen. Hyperventilation and photic stimulation were not performed.  There were no epileptiform discharges or electrographic seizures seen.    EKG lead was unremarkable.  Impression: This predominantly drowsy and asleep EEG is abnormal due to mild to moderate diffuse background slowing.  Clinical Correlation of the above findings indicates diffuse cerebral dysfunction that is non-specific in etiology and can be seen with hypoxic/ischemic injury, toxic/metabolic encephalopathies, neurodegenerative disorders, or medication effect.  The absence of epileptiform discharges does not rule out a clinical diagnosis of epilepsy.  Clinical correlation is advised.   Patrcia DollyKaren Shreyansh Tiffany, M.D.

## 2017-06-15 NOTE — Progress Notes (Signed)
EEG Completed; Results Pending  

## 2017-06-15 NOTE — Consult Note (Addendum)
Neurology Consultation  Reason for Consult: Code stroke Referring Physician: Dr. Preston Fleeting  CC: Garbled speech confusion  History is obtained from: EMS, family, chart  HPI: Levi Robinson is a 84 y.o. male past medical history of recent fall followed by right-sided subdural hematoma for which she was admitted to [facility] and discharged on 06/01/2017, coronary artery disease, diabetes, hypertension, presenting for evaluation of garbled speech and aphasia. He was in his usual state of health at around 10 PM, family noticed that he is unable to form words and appears altered.  His blood sugars were low-55.  EMS was called, on their arrival, sugar was still 65.  On giving D10, he improved some in terms of his mental status but his speech was still altered.  They brought him in as an acute code stroke because of the cortical symptoms. He recently was admitted for subdural hematoma, medically managed, no neurosurgical consultation obtained as the family did not want any surgical intervention.  Discharged home.  Was doing normal per family. At baseline, needs help with bathing but is able to feed himself and walk.  CT scan done revealed worsening subdural hematomas bilaterally with an increased size of the hygroma on the left and creasing size on the right subdural with right to left midline shift.  VHQ:IONGEX to obtain due to altered mental status.   Past Medical History:  Diagnosis Date  . Anemia    a. Felt to be iatrogenic from procedures/sticks 06/2011  . Arthritis   . CAD (coronary artery disease)    a. NSTEMI s/p complex PCI to prox Cx 07/03/11  . Carotid arterial disease (HCC)    a. By MRI 05/2011;  b. 06/2011 Carotid U/S: LICA 80-99%, RICA 100%, patent vertebrals.  . Diabetes mellitus   . Encephalopathy    AMS during 06/2011 hospitalization - MRI of brain without infarct but did show R carotid dz, abnl EEG with nonspecific encephalopathy, felt secondary to drugs  . Hypertension   .  Myocardial infarction (HCC)   . Transient atrial fibrillation or flutter    Brief WCT during hospitalization 06/2011 felt to be afib with abberation, not a good coumadin candidate    Family History  Problem Relation Age of Onset  . Diabetes Mother   . Cancer Father   . Cancer Sister   . Cancer Brother   . Cancer Daughter    Social History:   reports that he quit smoking about 31 years ago. His smoking use included cigarettes. He has never used smokeless tobacco. He reports that he does not drink alcohol or use drugs.  Medications  Current Facility-Administered Medications:  .  iopamidol (ISOVUE-370) 76 % injection, , , ,  .  iopamidol (ISOVUE-370) 76 % injection, , , ,  .  levETIRAcetam (KEPPRA) IVPB 500 mg/100 mL premix, 500 mg, Intravenous, Q12H, Milon Dikes, MD  Current Outpatient Medications:  .  carvedilol (COREG) 12.5 MG tablet, Take 6.25 mg by mouth 2 (two) times daily with a meal., Disp: , Rfl:  .  fish oil-omega-3 fatty acids 1000 MG capsule, Take 1 g by mouth 3 (three) times daily. , Disp: , Rfl:  .  furosemide (LASIX) 40 MG tablet, TAKE 1 TABLET BY MOUTH TWICE A DAY, Disp: 60 tablet, Rfl: 10 .  insulin detemir (LEVEMIR) 100 UNIT/ML injection, Inject 48 Units into the skin daily. Inject 16-20 units at dinner if needed., Disp: , Rfl:  .  isosorbide mononitrate (IMDUR) 30 MG 24 hr tablet, Take  30 mg by mouth daily after lunch., Disp: , Rfl:  .  ketoconazole (NIZORAL) 2 % cream, Apply 1 application topically as needed. For rash, Disp: , Rfl:  .  levothyroxine (SYNTHROID, LEVOTHROID) 50 MCG tablet, Take 1 tablet (50 mcg total) by mouth daily., Disp: 30 tablet, Rfl: 3 .  Menthol-Methyl Salicylate (TIGER BALM LINIMENT EX), Apply 1 application topically as needed (neck pain)., Disp: , Rfl:  .  nitroGLYCERIN (NITROSTAT) 0.4 MG SL tablet, Place 1 tablet (0.4 mg total) under the tongue every 5 (five) minutes x 3 doses as needed for chest pain., Disp: 25 tablet, Rfl: 4 .  ondansetron  (ZOFRAN ODT) 4 MG disintegrating tablet, 4mg  ODT q4 hours prn nausea/vomit, Disp: 20 tablet, Rfl: 0 .  pantoprazole (PROTONIX) 40 MG tablet, Take 40 mg by mouth daily after lunch. , Disp: , Rfl:  .  potassium chloride (K-DUR) 10 MEQ tablet, Take 10 mEq by mouth daily. , Disp: , Rfl: 0 .  simvastatin (ZOCOR) 40 MG tablet, Take 20 mg by mouth daily. Pt takes 1/2 tablet at bedtime , Disp: , Rfl:  .  Specialty Vitamins Products (PROSTATE PO), Take 600 mg by mouth daily. Super beta prostate, 600 mg , Disp: , Rfl:  .  traZODone (DESYREL) 50 MG tablet, Take 0.5 tablets (25 mg total) by mouth at bedtime as needed for sleep., Disp: 15 tablet, Rfl: 0  Exam: Current vital signs: BP (!) 153/76   Resp (!) 21  Vital signs in last 24 hours: Resp:  [21] 21 (06/03 0415) BP: (153)/(76) 153/76 (06/03 0415) General: Awake alert in no distress H ENT: Normocephalic atraumatic dry mucous membranes Lungs clear to auscultation Cardia vascular: S1-S2 heard, regular rate rhythm Abdomen: Soft nondistended nontender Extremities: Warm and well-perfused Neurological exam Patient awake, alert, oriented to self.  Was not able to tell me where he was at or today's date.  He was able to tell me his age.  He was able to follow some commands inconsistently. Speech: Not dysarthric.  He was able to repeat, but unable to name or consistently follow. Cranial nerves: Pupils equal round reactive to light, no gaze deviation, extraocular movements intact, inconsistent blink to threat bilaterally.  Face symmetric. Motor exam: No focal motor deficit noted on my exam. Sensory: No sensory loss appreciated Coordination: Did not follow commands consistently to check for finger-nose-finger or heel-knee-shin Gait testing was deferred 1a Level of Conscious.: 0 1b LOC Questions: 1 1c LOC Commands: 2 2 Best Gaze: 0 3 Visual: 0 4 Facial Palsy: 0 5a Motor Arm - left: 0 5b Motor Arm - Right: 0 6a Motor Leg - Left: 0 6b Motor Leg - Right:  0 7 Limb Ataxia: 0 8 Sensory: 0 9 Best Language: 2 10 Dysarthria: 0 11 Extinct. and Inatten.:  0 TOTAL: 5  Labs I have reviewed labs in epic and the results pertinent to this consultation are:  CBC    Component Value Date/Time   WBC 6.6 06/15/2017 0352   RBC 3.85 (L) 06/15/2017 0352   HGB 12.6 (L) 06/15/2017 0352   HGB 12.0 (L) 06/15/2017 0352   HCT 37.0 (L) 06/15/2017 0352   HCT 35.6 (L) 06/15/2017 0352   PLT 235 06/15/2017 0352   MCV 92.5 06/15/2017 0352   MCH 31.2 06/15/2017 0352   MCHC 33.7 06/15/2017 0352   RDW 13.0 06/15/2017 0352   LYMPHSABS 1.9 06/15/2017 0352   MONOABS 0.6 06/15/2017 0352   EOSABS 0.1 06/15/2017 0352   BASOSABS 0.0 06/15/2017  0352    CMP     Component Value Date/Time   NA 135 06/15/2017 0352   NA 133 (L) 06/15/2017 0352   NA 133 (L) 02/12/2017 1212   K 3.8 06/15/2017 0352   K 3.7 06/15/2017 0352   CL 99 (L) 06/15/2017 0352   CL 102 06/15/2017 0352   CO2 22 06/15/2017 0352   GLUCOSE 105 (H) 06/15/2017 0352   GLUCOSE 107 (H) 06/15/2017 0352   BUN 23 (H) 06/15/2017 0352   BUN 19 06/15/2017 0352   BUN 29 02/12/2017 1212   CREATININE 1.50 (H) 06/15/2017 0352   CREATININE 1.62 (H) 06/15/2017 0352   CALCIUM 8.5 (L) 06/15/2017 0352   PROT 7.1 06/15/2017 0352   ALBUMIN 3.1 (L) 06/15/2017 0352   AST 21 06/15/2017 0352   ALT 13 (L) 06/15/2017 0352   ALKPHOS 60 06/15/2017 0352   BILITOT 0.7 06/15/2017 0352   GFRNONAA 35 (L) 06/15/2017 0352   GFRAA 41 (L) 06/15/2017 0352  Imaging I have reviewed the images obtained: CT-scan of the brain IMPRESSION: 1. Enlarging 2.1 cm RIGHT subdural hematoma resulting in early LEFT ventricle entrapment. 2. Enlarging LEFT 1 cm hygroma.  Assessment:  84 year old with a recent subdural hematoma coming back with altered mental status and expressive aphasia. CT scan concerning for enlarging right subdural hematoma and resulting in early left ventricular entrapment along with enlarging left sided  hygroma. Symptoms could be attributable just to these enlarging subdurals but could also be underlying seizures. At this time, I would recommend initiating antiepileptics and further management per neurosurgery.  Impression: Worsening subdural hematomas bilaterally  Recommendations Load with Keppra 1 g IV x1 Continue Keppra 500 twice daily Maintain seizure precautions Obtain a emergent neurosurgical consult EEG SBP less than 140 Avoid antiplatelets and anticoagulants due to the increasing size of the subdural. Please call with questions.  We will follow the EEG results with you.  -- Milon Dikes, MD [facility] Pager: [phone] If 7pm to 7am, please call on call as listed on AMION.   CRITICAL CARE ATTESTATION This patient is critically ill and at significant risk of neurological worsening, death and care requires constant monitoring of vital signs, hemodynamics,respiratory and cardiac monitoring. I spent 35  minutes of neurocritical care time performing neurological assessment, discussion with family, other specialists and medical decision making of high complexityin the care of  this patient.

## 2017-06-16 ENCOUNTER — Other Ambulatory Visit: Payer: Self-pay

## 2017-06-16 NOTE — Patient Outreach (Signed)
[facility]) Care Management  06/16/2017  Burr MedicoBernice L Robinson 05/24/1921 [id number]   Telephone Screen  Referral Date: 06/10/17 [facility] Referral Reason: " further assistance in managing chronic health issues and provide information/health education on ways to further manage DM and other health issues after discharge" Insurance: [facility]   Outreach attempt # 1 to patient. Spoke with [name] on file and screening completed.  Social: She voices that she along with the her dtr-Sharese are the main caregivers for the patient. She states she lives nearby and her dtr lives with the patient. She voices that patient is still fairly able to do a lot of things for himself with assistance. He is getting aide services via [employer] benefit several days a week to assist with personal care. She voices that patient is ambulatory and using assistive devices but getting around slower than normal given his recent decline.   Conditions: Per chart review, patient has PMH of DM, HTN, A-fib, CHF, CKD stage 3, CAD and MI. Patient was recently hospitalized from 06/05/17 to 06/10/17 for a fall and suffered a subdural hematoma.(PCP office does TOC.) He was recovering fairly well per dtr report until he became confused and blood sugar dropped. He was taken to ED and evaluated.  She voices that she is currently not in the home with the patient and does not know what his blood sugar is for today. However, she states that cbgs have been ranging in the 70s to 100s fasting since returning home. She denies any further hypoglycemic events. Last A1C was 9.1 per hospital records. Patient's appetite is fairly well. He is eating a bedtime snack as well. RN CM provided some education to caregiver on diet and ways to prevent low blood sugars. Dtr voiced understanding.   Medications: Dtr unable to review meds as she is not currently in the home with patient and meds. Dtr confirmed with RN  CM that patient is NOT on sliding scale insulin. He is only taking insulin(Levemir) QHS. Dtr feels that dosage is too high for patient and states she had already planned to contact PCP office today to discuss this.She voices that patient was given nee script for Keppra during ED visit on yesterday and she will pick med up today. Otherwise, she denies any issues managing and/or affording patient's meds.    Appointments: Dtr has not made MD follow up appts. She is aware of time frame for follow ups and voices that she will call MD offices today to make appts.  Consent[facility] and discussed with dtr. She voices no care coordination needs at this time. She is interested and agreeable to diabetes education to help better manage the patient's condition. Dtr provided verbal consent for services.   Plan: RN CM will [facility] Coach referral for further disease education and mgmt of chronic conditions.    Antionette Fairyoshanda Marshal Schrecengost, RN,BSN,CCM [facility] Care Management Telephonic Care Management Coordinator Direct Phone: [phone] Toll Free: [phone] Fax: [phone]

## 2017-06-17 DIAGNOSIS — W19XXXA Unspecified fall, initial encounter: Secondary | ICD-10-CM | POA: Diagnosis not present

## 2017-06-17 DIAGNOSIS — I130 Hypertensive heart and chronic kidney disease with heart failure and stage 1 through stage 4 chronic kidney disease, or unspecified chronic kidney disease: Secondary | ICD-10-CM | POA: Diagnosis not present

## 2017-06-17 DIAGNOSIS — E119 Type 2 diabetes mellitus without complications: Secondary | ICD-10-CM | POA: Diagnosis not present

## 2017-06-17 DIAGNOSIS — I10 Essential (primary) hypertension: Secondary | ICD-10-CM | POA: Diagnosis not present

## 2017-06-17 DIAGNOSIS — S0090XD Unspecified superficial injury of unspecified part of head, subsequent encounter: Secondary | ICD-10-CM | POA: Diagnosis not present

## 2017-06-17 DIAGNOSIS — I6200 Nontraumatic subdural hemorrhage, unspecified: Secondary | ICD-10-CM | POA: Diagnosis not present

## 2017-06-17 DIAGNOSIS — E1122 Type 2 diabetes mellitus with diabetic chronic kidney disease: Secondary | ICD-10-CM | POA: Diagnosis not present

## 2017-06-17 DIAGNOSIS — S065X0D Traumatic subdural hemorrhage without loss of consciousness, subsequent encounter: Secondary | ICD-10-CM | POA: Diagnosis not present

## 2017-06-17 DIAGNOSIS — S0101XA Laceration without foreign body of scalp, initial encounter: Secondary | ICD-10-CM | POA: Diagnosis not present

## 2017-06-17 DIAGNOSIS — I5032 Chronic diastolic (congestive) heart failure: Secondary | ICD-10-CM | POA: Diagnosis not present

## 2017-06-18 DIAGNOSIS — S0090XD Unspecified superficial injury of unspecified part of head, subsequent encounter: Secondary | ICD-10-CM | POA: Diagnosis not present

## 2017-06-18 DIAGNOSIS — I130 Hypertensive heart and chronic kidney disease with heart failure and stage 1 through stage 4 chronic kidney disease, or unspecified chronic kidney disease: Secondary | ICD-10-CM | POA: Diagnosis not present

## 2017-06-18 DIAGNOSIS — E1122 Type 2 diabetes mellitus with diabetic chronic kidney disease: Secondary | ICD-10-CM | POA: Diagnosis not present

## 2017-06-18 DIAGNOSIS — I5032 Chronic diastolic (congestive) heart failure: Secondary | ICD-10-CM | POA: Diagnosis not present

## 2017-06-18 DIAGNOSIS — S065X0D Traumatic subdural hemorrhage without loss of consciousness, subsequent encounter: Secondary | ICD-10-CM | POA: Diagnosis not present

## 2017-06-22 ENCOUNTER — Encounter: Payer: Self-pay | Admitting: *Deleted

## 2017-06-22 DIAGNOSIS — I5032 Chronic diastolic (congestive) heart failure: Secondary | ICD-10-CM | POA: Diagnosis not present

## 2017-06-22 DIAGNOSIS — E1122 Type 2 diabetes mellitus with diabetic chronic kidney disease: Secondary | ICD-10-CM | POA: Diagnosis not present

## 2017-06-22 DIAGNOSIS — S065X0D Traumatic subdural hemorrhage without loss of consciousness, subsequent encounter: Secondary | ICD-10-CM | POA: Diagnosis not present

## 2017-06-22 DIAGNOSIS — I130 Hypertensive heart and chronic kidney disease with heart failure and stage 1 through stage 4 chronic kidney disease, or unspecified chronic kidney disease: Secondary | ICD-10-CM | POA: Diagnosis not present

## 2017-06-22 DIAGNOSIS — S0090XD Unspecified superficial injury of unspecified part of head, subsequent encounter: Secondary | ICD-10-CM | POA: Diagnosis not present

## 2017-06-24 DIAGNOSIS — S065X0D Traumatic subdural hemorrhage without loss of consciousness, subsequent encounter: Secondary | ICD-10-CM | POA: Diagnosis not present

## 2017-06-24 DIAGNOSIS — I130 Hypertensive heart and chronic kidney disease with heart failure and stage 1 through stage 4 chronic kidney disease, or unspecified chronic kidney disease: Secondary | ICD-10-CM | POA: Diagnosis not present

## 2017-06-24 DIAGNOSIS — E1122 Type 2 diabetes mellitus with diabetic chronic kidney disease: Secondary | ICD-10-CM | POA: Diagnosis not present

## 2017-06-24 DIAGNOSIS — S0090XD Unspecified superficial injury of unspecified part of head, subsequent encounter: Secondary | ICD-10-CM | POA: Diagnosis not present

## 2017-06-24 DIAGNOSIS — I5032 Chronic diastolic (congestive) heart failure: Secondary | ICD-10-CM | POA: Diagnosis not present

## 2017-06-25 DIAGNOSIS — I130 Hypertensive heart and chronic kidney disease with heart failure and stage 1 through stage 4 chronic kidney disease, or unspecified chronic kidney disease: Secondary | ICD-10-CM | POA: Diagnosis not present

## 2017-06-25 DIAGNOSIS — S0090XD Unspecified superficial injury of unspecified part of head, subsequent encounter: Secondary | ICD-10-CM | POA: Diagnosis not present

## 2017-06-25 DIAGNOSIS — S065X0D Traumatic subdural hemorrhage without loss of consciousness, subsequent encounter: Secondary | ICD-10-CM | POA: Diagnosis not present

## 2017-06-25 DIAGNOSIS — I5032 Chronic diastolic (congestive) heart failure: Secondary | ICD-10-CM | POA: Diagnosis not present

## 2017-06-29 DIAGNOSIS — I130 Hypertensive heart and chronic kidney disease with heart failure and stage 1 through stage 4 chronic kidney disease, or unspecified chronic kidney disease: Secondary | ICD-10-CM | POA: Diagnosis not present

## 2017-06-29 DIAGNOSIS — E1122 Type 2 diabetes mellitus with diabetic chronic kidney disease: Secondary | ICD-10-CM | POA: Diagnosis not present

## 2017-06-29 DIAGNOSIS — S0090XD Unspecified superficial injury of unspecified part of head, subsequent encounter: Secondary | ICD-10-CM | POA: Diagnosis not present

## 2017-06-29 DIAGNOSIS — I5032 Chronic diastolic (congestive) heart failure: Secondary | ICD-10-CM | POA: Diagnosis not present

## 2017-06-29 DIAGNOSIS — S065X0D Traumatic subdural hemorrhage without loss of consciousness, subsequent encounter: Secondary | ICD-10-CM | POA: Diagnosis not present

## 2017-06-30 ENCOUNTER — Encounter (HOSPITAL_BASED_OUTPATIENT_CLINIC_OR_DEPARTMENT_OTHER): Payer: Self-pay | Admitting: Adult Health

## 2017-06-30 ENCOUNTER — Emergency Department (HOSPITAL_BASED_OUTPATIENT_CLINIC_OR_DEPARTMENT_OTHER)
Admission: EM | Admit: 2017-06-30 | Discharge: 2017-06-30 | Disposition: A | Discharge status: Home / Self Care | Payer: Medicare Other | Attending: Emergency Medicine | Admitting: Emergency Medicine

## 2017-06-30 ENCOUNTER — Other Ambulatory Visit: Payer: Self-pay

## 2017-06-30 DIAGNOSIS — N183 Chronic kidney disease, stage 3 (moderate): Secondary | ICD-10-CM | POA: Diagnosis not present

## 2017-06-30 DIAGNOSIS — Z79899 Other long term (current) drug therapy: Secondary | ICD-10-CM | POA: Insufficient documentation

## 2017-06-30 DIAGNOSIS — Z87891 Personal history of nicotine dependence: Secondary | ICD-10-CM | POA: Insufficient documentation

## 2017-06-30 DIAGNOSIS — S0101XD Laceration without foreign body of scalp, subsequent encounter: Secondary | ICD-10-CM | POA: Diagnosis not present

## 2017-06-30 DIAGNOSIS — I2510 Atherosclerotic heart disease of native coronary artery without angina pectoris: Secondary | ICD-10-CM | POA: Diagnosis not present

## 2017-06-30 DIAGNOSIS — Z794 Long term (current) use of insulin: Secondary | ICD-10-CM | POA: Insufficient documentation

## 2017-06-30 DIAGNOSIS — Z4802 Encounter for removal of sutures: Secondary | ICD-10-CM | POA: Diagnosis not present

## 2017-06-30 DIAGNOSIS — E119 Type 2 diabetes mellitus without complications: Secondary | ICD-10-CM | POA: Diagnosis not present

## 2017-06-30 DIAGNOSIS — I130 Hypertensive heart and chronic kidney disease with heart failure and stage 1 through stage 4 chronic kidney disease, or unspecified chronic kidney disease: Secondary | ICD-10-CM | POA: Diagnosis not present

## 2017-06-30 DIAGNOSIS — I252 Old myocardial infarction: Secondary | ICD-10-CM | POA: Insufficient documentation

## 2017-06-30 DIAGNOSIS — I509 Heart failure, unspecified: Secondary | ICD-10-CM | POA: Insufficient documentation

## 2017-06-30 DIAGNOSIS — X58XXXD Exposure to other specified factors, subsequent encounter: Secondary | ICD-10-CM | POA: Insufficient documentation

## 2017-06-30 NOTE — ED Provider Notes (Signed)
[facility] EMERGENCY DEPARTMENT Provider Note   CSN: [id number] Arrival date & time: 06/30/17  1244     History   Chief Complaint Chief Complaint  Patient presents with  . Suture / Staple Removal    HPI Levi Robinson is a 84 y.o. male.  HPI 84 year old male presents for staple removal of his posterior scalp.  Laceration repaired on 06/05/2017.  Doing well since laceration.  No infection.  No fever   Past Medical History:  Diagnosis Date  . Anemia    a. Felt to be iatrogenic from procedures/sticks 06/2011  . Arthritis   . CAD (coronary artery disease)    a. NSTEMI s/p complex PCI to prox Cx 07/03/11  . Carotid arterial disease (HCC)    a. By MRI 05/2011;  b. 06/2011 Carotid U/S: LICA 80-99%, RICA 100%, patent vertebrals.  . Diabetes mellitus   . Encephalopathy    AMS during 06/2011 hospitalization - MRI of brain without infarct but did show R carotid dz, abnl EEG with nonspecific encephalopathy, felt secondary to drugs  . Hypertension   . Myocardial infarction (HCC)   . Transient atrial fibrillation or flutter    Brief WCT during hospitalization 06/2011 felt to be afib with abberation, not a good coumadin candidate    Patient Active Problem List   Diagnosis Date Noted  . PAF (paroxysmal atrial fibrillation) (HCC)   . Benign essential HTN   . Stage 3 chronic kidney disease (HCC)   . Atrial fibrillation with RVR (HCC) 06/05/2017  . Subdural hematoma, acute (HCC) 06/05/2017  . Altered mental status 06/05/2017  . Acute CHF (congestive heart failure) (HCC) 06/05/2017  . Fall 06/05/2017  . Uncontrolled diabetes mellitus (HCC) 06/05/2017  . DNR (do not resuscitate) 06/05/2017  . Subdural hematoma (HCC) 06/05/2017  . Occlusion and stenosis of carotid artery without mention of cerebral infarction 08/04/2011  . Hypertension   . CAD (coronary artery disease)   . Carotid arterial disease (HCC)   . Anemia   . Posterior vitreous detachment 06/05/2011  . Status post  intraocular lens implant 06/05/2011  . Branch retinal vein occlusion 12/12/2010  . Nonproliferative diabetic retinopathy (HCC) 12/12/2010    Past Surgical History:  Procedure Laterality Date  . APPENDECTOMY  1948  . LEFT HEART CATHETERIZATION WITH CORONARY ANGIOGRAM N/A 07/01/2011   Procedure: LEFT HEART CATHETERIZATION WITH CORONARY ANGIOGRAM;  Surgeon: Herby Abrahamhomas D Stuckey, MD;  [facility];  Service: Cardiovascular;  Laterality: N/A;  . PERCUTANEOUS CORONARY STENT INTERVENTION (PCI-S) N/A 07/03/2011   Procedure: PERCUTANEOUS CORONARY STENT INTERVENTION (PCI-S);  Surgeon: Herby Abrahamhomas D Stuckey, MD;  [facility];  Service: Cardiovascular;  Laterality: N/A;  . thumb surgery  1975   left        Home Medications    Prior to Admission medications   Medication Sig Start Date End Date Taking? Authorizing Provider  carvedilol (COREG) 12.5 MG tablet Take 6.25 mg by mouth 2 (two) times daily with a meal.    [provider]  fish oil-omega-3 fatty acids 1000 MG capsule Take 1 g by mouth 3 (three) times daily.     [provider]  furosemide (LASIX) 40 MG tablet TAKE 1 TABLET BY MOUTH TWICE A DAY Patient not taking: Reported on 06/15/2017 04/24/17   Kathleene HazelMcAlhany, Christopher D, MD  furosemide (LASIX) 40 MG tablet Take 20 mg by mouth See admin instructions. Alternate taking 1 tablet in the morning one day then take 1 tablet in the morning and  1 tablet in the afternoon the other day    [provider]  insulin detemir (LEVEMIR) 100 UNIT/ML injection Inject 48 Units into the skin daily. Inject 16-20 units at dinner if needed. 07/04/11   Dunn, Tacey Ruiz, PA-C  isosorbide mononitrate (IMDUR) 30 MG 24 hr tablet Take 30 mg by mouth daily after lunch. 07/04/11   Dunn, Tacey Ruiz, PA-C  ketoconazole (NIZORAL) 2 % cream Apply 1 application topically as needed. For rash 07/28/11   [provider]  levETIRAcetam (KEPPRA) 500 MG tablet Take 1 tablet (500 mg total) by mouth 2  (two) times daily. 06/15/17   Arby Barrette, MD  levothyroxine (SYNTHROID, LEVOTHROID) 50 MCG tablet Take 1 tablet (50 mcg total) by mouth daily. 06/22/14   Kathleene Hazel, MD  nitroGLYCERIN (NITROSTAT) 0.4 MG SL tablet Place 1 tablet (0.4 mg total) under the tongue every 5 (five) minutes x 3 doses as needed for chest pain. 07/04/11   Dunn, Tacey Ruiz, PA-C  ondansetron (ZOFRAN ODT) 4 MG disintegrating tablet 4mg  ODT q4 hours prn nausea/vomit Patient taking differently: Take 4 mg by mouth every 4 (four) hours as needed for nausea or vomiting.  04/15/17   Melene Plan, DO  pantoprazole (PROTONIX) 40 MG tablet Take 40 mg by mouth daily after lunch.     [provider]  potassium chloride (K-DUR) 10 MEQ tablet Take 5-10 mEq by mouth See admin instructions. Take 1 tablet every morning and take 1/2 tablet every evening 11/07/13   [provider]  simvastatin (ZOCOR) 40 MG tablet Take 20 mg by mouth daily.     [provider]  Specialty Vitamins Products (PROSTATE PO) Take 600 mg by mouth daily. Super beta prostate, 600 mg     [provider]  traZODone (DESYREL) 50 MG tablet Take 0.5 tablets (25 mg total) by mouth at bedtime as needed for sleep. 06/10/17   Albertine Grates, MD    Family History Family History  Problem Relation Age of Onset  . Diabetes Mother   . Cancer Father   . Cancer Sister   . Cancer Brother   . Cancer Daughter     Social History Social History   Tobacco Use  . Smoking status: Former Smoker    Types: Cigarettes    Last attempt to quit: 01/13/1986    Years since quitting: 31.4  . Smokeless tobacco: Never Used  Substance Use Topics  . Alcohol use: No  . Drug use: No     Allergies   Ativan [lorazepam] and Fentanyl   Review of Systems Review of Systems  Constitutional: Negative for activity change and fever.  Neurological: Negative for headaches.     Physical Exam Updated Vital Signs BP 122/68   Pulse 72   Temp 98.2 F (36.8 C)  (Oral)   Resp 18   Ht 5\' 10"  (1.778 m)   Wt 72.6 kg (160 lb)   SpO2 99%   BMI 22.96 kg/m   Physical Exam  Constitutional: He is oriented to person, place, and time. He appears well-developed and well-nourished.  HENT:  Head: Normocephalic.  3 staples in posterior scalp without surrounding signs of infection.  Laceration healed  Eyes: EOM are normal.  Neck: Normal range of motion.  Pulmonary/Chest: Effort normal.  Abdominal: He exhibits no distension.  Musculoskeletal: Normal range of motion.  Neurological: He is alert and oriented to person, place, and time.  Psychiatric: He has a normal mood and affect.  Nursing note and vitals reviewed.  ED Treatments / Results  Labs (all labs ordered are listed, but only abnormal results are displayed) Labs Reviewed - No data to display  EKG None  Radiology No results found.  Procedures .Suture Removal Performed by: Azalia Bilis, MD Authorized by: Azalia Bilis, MD    SUTURE REMOVAL Performed by: Azalia Bilis Consent: Verbal consent obtained. Patient identity confirmed: provided demographic data Time out: Immediately prior to procedure a "time out" was called to verify the correct patient, procedure, equipment, support staff and site/side marked as required. Location: Posterior scalp Wound Appearance: clean Sutures/Staples Removed: 3 Patient tolerance: Patient tolerated the procedure well with no immediate complications.     Medications Ordered in ED Medications - No data to display   Initial Impression / Assessment and Plan / ED Course  I have reviewed the triage vital signs and the nursing notes.  Pertinent labs & imaging results that were available during my care of the patient were reviewed by me and considered in my medical decision making (see chart for details).     Well-healed.  Sutures removed without difficulty  Final Clinical Impressions(s) / ED Diagnoses   Final diagnoses:  Encounter for staple  removal    ED Discharge Orders    None       Azalia Bilis, MD 06/30/17 1322

## 2017-06-30 NOTE — ED Triage Notes (Signed)
Presents requesting staple removal from May 24. He has been in the hopital and they did not take them out. He has 3 staples in the back of head.

## 2017-06-30 NOTE — ED Notes (Signed)
3 staples noted to back of head on the R side.  Healing noted with no s/s of drainage or edema.  Pt. Is in no distress with daughter at bedside.

## 2017-07-01 DIAGNOSIS — E1122 Type 2 diabetes mellitus with diabetic chronic kidney disease: Secondary | ICD-10-CM | POA: Diagnosis not present

## 2017-07-01 DIAGNOSIS — I5032 Chronic diastolic (congestive) heart failure: Secondary | ICD-10-CM | POA: Diagnosis not present

## 2017-07-01 DIAGNOSIS — I130 Hypertensive heart and chronic kidney disease with heart failure and stage 1 through stage 4 chronic kidney disease, or unspecified chronic kidney disease: Secondary | ICD-10-CM | POA: Diagnosis not present

## 2017-07-01 DIAGNOSIS — S0090XD Unspecified superficial injury of unspecified part of head, subsequent encounter: Secondary | ICD-10-CM | POA: Diagnosis not present

## 2017-07-01 DIAGNOSIS — S065X0D Traumatic subdural hemorrhage without loss of consciousness, subsequent encounter: Secondary | ICD-10-CM | POA: Diagnosis not present

## 2017-07-02 DIAGNOSIS — I130 Hypertensive heart and chronic kidney disease with heart failure and stage 1 through stage 4 chronic kidney disease, or unspecified chronic kidney disease: Secondary | ICD-10-CM | POA: Diagnosis not present

## 2017-07-02 DIAGNOSIS — E1122 Type 2 diabetes mellitus with diabetic chronic kidney disease: Secondary | ICD-10-CM | POA: Diagnosis not present

## 2017-07-02 DIAGNOSIS — I5032 Chronic diastolic (congestive) heart failure: Secondary | ICD-10-CM | POA: Diagnosis not present

## 2017-07-02 DIAGNOSIS — S065X0D Traumatic subdural hemorrhage without loss of consciousness, subsequent encounter: Secondary | ICD-10-CM | POA: Diagnosis not present

## 2017-07-02 DIAGNOSIS — S0090XD Unspecified superficial injury of unspecified part of head, subsequent encounter: Secondary | ICD-10-CM | POA: Diagnosis not present

## 2017-07-06 DIAGNOSIS — S0090XD Unspecified superficial injury of unspecified part of head, subsequent encounter: Secondary | ICD-10-CM | POA: Diagnosis not present

## 2017-07-06 DIAGNOSIS — E1122 Type 2 diabetes mellitus with diabetic chronic kidney disease: Secondary | ICD-10-CM | POA: Diagnosis not present

## 2017-07-06 DIAGNOSIS — I130 Hypertensive heart and chronic kidney disease with heart failure and stage 1 through stage 4 chronic kidney disease, or unspecified chronic kidney disease: Secondary | ICD-10-CM | POA: Diagnosis not present

## 2017-07-06 DIAGNOSIS — S065X0D Traumatic subdural hemorrhage without loss of consciousness, subsequent encounter: Secondary | ICD-10-CM | POA: Diagnosis not present

## 2017-07-06 DIAGNOSIS — I5032 Chronic diastolic (congestive) heart failure: Secondary | ICD-10-CM | POA: Diagnosis not present

## 2017-07-08 DIAGNOSIS — E1122 Type 2 diabetes mellitus with diabetic chronic kidney disease: Secondary | ICD-10-CM | POA: Diagnosis not present

## 2017-07-08 DIAGNOSIS — S0090XD Unspecified superficial injury of unspecified part of head, subsequent encounter: Secondary | ICD-10-CM | POA: Diagnosis not present

## 2017-07-08 DIAGNOSIS — I130 Hypertensive heart and chronic kidney disease with heart failure and stage 1 through stage 4 chronic kidney disease, or unspecified chronic kidney disease: Secondary | ICD-10-CM | POA: Diagnosis not present

## 2017-07-08 DIAGNOSIS — I5032 Chronic diastolic (congestive) heart failure: Secondary | ICD-10-CM | POA: Diagnosis not present

## 2017-07-08 DIAGNOSIS — S065X0D Traumatic subdural hemorrhage without loss of consciousness, subsequent encounter: Secondary | ICD-10-CM | POA: Diagnosis not present

## 2017-07-14 DIAGNOSIS — S0090XD Unspecified superficial injury of unspecified part of head, subsequent encounter: Secondary | ICD-10-CM | POA: Diagnosis not present

## 2017-07-14 DIAGNOSIS — S065X0D Traumatic subdural hemorrhage without loss of consciousness, subsequent encounter: Secondary | ICD-10-CM | POA: Diagnosis not present

## 2017-07-14 DIAGNOSIS — I5032 Chronic diastolic (congestive) heart failure: Secondary | ICD-10-CM | POA: Diagnosis not present

## 2017-07-14 DIAGNOSIS — I130 Hypertensive heart and chronic kidney disease with heart failure and stage 1 through stage 4 chronic kidney disease, or unspecified chronic kidney disease: Secondary | ICD-10-CM | POA: Diagnosis not present

## 2017-07-14 DIAGNOSIS — E1122 Type 2 diabetes mellitus with diabetic chronic kidney disease: Secondary | ICD-10-CM | POA: Diagnosis not present

## 2017-07-16 DIAGNOSIS — S0090XD Unspecified superficial injury of unspecified part of head, subsequent encounter: Secondary | ICD-10-CM | POA: Diagnosis not present

## 2017-07-16 DIAGNOSIS — I5032 Chronic diastolic (congestive) heart failure: Secondary | ICD-10-CM | POA: Diagnosis not present

## 2017-07-16 DIAGNOSIS — I130 Hypertensive heart and chronic kidney disease with heart failure and stage 1 through stage 4 chronic kidney disease, or unspecified chronic kidney disease: Secondary | ICD-10-CM | POA: Diagnosis not present

## 2017-07-16 DIAGNOSIS — S065X0D Traumatic subdural hemorrhage without loss of consciousness, subsequent encounter: Secondary | ICD-10-CM | POA: Diagnosis not present

## 2017-07-16 DIAGNOSIS — E1122 Type 2 diabetes mellitus with diabetic chronic kidney disease: Secondary | ICD-10-CM | POA: Diagnosis not present

## 2017-07-24 ENCOUNTER — Other Ambulatory Visit: Payer: Self-pay | Admitting: *Deleted

## 2017-07-24 NOTE — Patient Outreach (Signed)
[facility]) Care Management  07/24/2017  Levi Robinson 10/21/1921 [id number]   RN Health Coach Initial Assessment  Referral Date:  06/16/2017 Referral Source:  [facility] Reason for Referral:  "further assistance in managing chronic health issues and provide information/health education on ways to further manage DM and other health issues after discharge" Insurance:  [employer]   Outreach Attempt:  Outreach attempt #1 to patient's granddaughter,Levi Robinson (Release of Information on file) for initial telephone assessment.   Levi Robinson answered and verified HIPAA.  States she is patient's primary care provider and request calls to her as he is hard of hearing and doesn't understand a lot of medical things over the telephone.  RN Health Coach introduced self and role.  Granddaughter verbally agrees to monthly telephone outreaches.  Archie Pattenonya stating she is not able to complete initial telephone assessment today and requesting a telephone call back next week.  Plan:  RN Health Coach will make another outreach attempt to complete initial telephone assessment within the next 4 business days.   Rhae LernerFarrah Adrionna Delcid RN [facility] Care Management  RN Health Coach [phone] [email or web address] [email or web address]

## 2017-07-27 ENCOUNTER — Other Ambulatory Visit: Payer: Self-pay | Admitting: *Deleted

## 2017-07-27 ENCOUNTER — Encounter: Payer: Self-pay | Admitting: *Deleted

## 2017-07-27 NOTE — Patient Outreach (Signed)
[facility]) Care Management  [facility] Care Manager  07/27/2017   ZYMEIR SALMINEN 02/06/1921 [id number]   RN Health Coach Initial Assessment   Referral Date:  06/16/2017 Referral Source:  [facility] Primary Care Navigator Reason for Referral:  "further assistance in managing chronic health issues and provide information/health education on ways to further manage DM and other health issues after discharge" Insurance:  [employer]   Outreach Attempt:  Successful telephone outreach to patient's granddaughter, Archie Patten for initial telephone assessment.  HIPAA verified with [name].  [name] completed initial telephone assessment.  Social:  Patient lives at home with great-granddaughter and daughter, Archie Patten lives next door.  Patient requires assistance with his ADLs and is dependent on his family to complete his IADLs.  Family transports to medical appointments.  Granddaughter reports patient ws ambulatory a few weeks ago with a walker, and now patient is more wheel chair bound due to his weakness.  Just completed Home Health PT/OT, but granddaughter is requesting further therapy.  Encouraged granddaughter to contact [facility] for resumption and to contact primary care provider for a referral.  Patient received home health aide 3 hours a day 4 days a week with Frances Furbish from the [employer].  Archie Patten reports she has placed a request to the [place] for 24 hour care assistance and is awaiting a response.  DME in the home include:  Wheelchair, rolling walker, Rolator walker, quad cane, upper and lower dentures, eyeglasses, bedside commode, shower chair with back, CBG meter, grab bars in shower and around toilet, and raised toilet seat lift.  Archie Patten reports she is awaiting a bed alarm and blood pressure cuff to come from the [employer].  Conditions:   Per chart review and discussion with family, PMH include but not limited to:  Anemia, arthritis, coronary artery disease with stenting,  carotid artery disease, diabetes, myocardial infarction, hypertension, atrial fibrillation, encephalopathy, and appendectomy.  Granddaughter reports recent fall with subdural hematoma.  States patient's staple to his head has been removed.  Since fall, [name] reports patient increasingly forgetful and confused.  Now utilizing a wheelchair instead of a walker.  Monitors blood sugars about 3 times a day.  Fasting blood sugar this morning was 125 with ranges of 60-130's.  Archie Patten reports patient is more slow to respond with blood sugars below 130's.  Encouraged granddaughter to contact primary care provider to discuss insulin/medication adjustments.  Archie Patten also concerned patient not on his aspirin and Plavix.  Discussed importance of holding these medications related to subdural hematoma and encouraged her to speak with Neurologist concerning medications.    Medications:  Archie Patten reports patient taking about 8 medications.  States she manages his medications with weekly pill box fills and she dispenses medications also without difficulties.  She gives insulin injections.  Reports she has never picked up Keppra prescription from pharmacy, so patient has not been taking this prescription.  Encouraged her to pick up medication and discuss with physician.  Encounter Medications:  Outpatient Encounter Medications as of 07/27/2017  Medication Sig Note  . carvedilol (COREG) 12.5 MG tablet Take 6.25 mg by mouth 2 (two) times daily with a meal.   . diphenhydrAMINE (BENADRYL) 25 MG tablet Take 25 mg by mouth at bedtime as needed.   . furosemide (LASIX) 40 MG tablet Take 20 mg by mouth See admin instructions. Alternate taking 1 tablet in the morning one day then take 1 tablet in the morning and 1 tablet in the afternoon the other day 07/27/2017: Taking  40 mg in the daily alternating with 40 mg twice a day  . insulin detemir (LEVEMIR) 100 UNIT/ML injection Inject 48 Units into the skin daily. Inject 16-20 units at dinner if  needed. 07/27/2017: Sliding scale take 30 units if CBG >300 and if <300 20 units  . isosorbide mononitrate (IMDUR) 30 MG 24 hr tablet Take 30 mg by mouth daily after lunch.   . ketoconazole (NIZORAL) 2 % cream Apply 1 application topically as needed. For rash   . levothyroxine (SYNTHROID, LEVOTHROID) 50 MCG tablet Take 1 tablet (50 mcg total) by mouth daily.   . nitroGLYCERIN (NITROSTAT) 0.4 MG SL tablet Place 1 tablet (0.4 mg total) under the tongue every 5 (five) minutes x 3 doses as needed for chest pain.   Marland Kitchen ondansetron (ZOFRAN ODT) 4 MG disintegrating tablet 4mg  ODT q4 hours prn nausea/vomit (Patient taking differently: Take 4 mg by mouth every 4 (four) hours as needed for nausea or vomiting. )   . pantoprazole (PROTONIX) 40 MG tablet Take 40 mg by mouth daily after lunch.    . potassium chloride (K-DUR) 10 MEQ tablet Take 5-10 mEq by mouth See admin instructions. Take 1 tablet every morning and take 1/2 tablet every evening 07/27/2017: Taking 1 tablet in afternoon and 1/2 tablet at bedtime  . simvastatin (ZOCOR) 40 MG tablet Take 20 mg by mouth daily.    . traZODone (DESYREL) 50 MG tablet Take 0.5 tablets (25 mg total) by mouth at bedtime as needed for sleep.   . fish oil-omega-3 fatty acids 1000 MG capsule Take 1 g by mouth 3 (three) times daily.  07/27/2017: Not currently taking  . furosemide (LASIX) 40 MG tablet TAKE 1 TABLET BY MOUTH TWICE A DAY (Patient not taking: Reported on 06/15/2017)   . levETIRAcetam (KEPPRA) 500 MG tablet Take 1 tablet (500 mg total) by mouth 2 (two) times daily. (Patient not taking: Reported on 07/27/2017) 07/27/2017: Reports not taking yet  . Specialty Vitamins Products (PROSTATE PO) Take 600 mg by mouth daily. Super beta prostate, 600 mg  07/27/2017: Reports not taking   No facility-administered encounter medications on file as of 07/27/2017.     Functional Status:  In your present state of health, do you have any difficulty performing the following activities:  07/27/2017  Hearing? Y  Vision? Y  Difficulty concentrating or making decisions? Y  Walking or climbing stairs? Y  Dressing or bathing? Y  Doing errands, shopping? Y  Preparing Food and eating ? Y  Comment family provides with meals and household management  Using the Toilet? Y  In the past six months, have you accidently leaked urine? Y  Do you have problems with loss of bowel control? Y  Managing your Medications? Y  Managing your Finances? Y  Housekeeping or managing your Housekeeping? Y  Some recent data might be hidden    Fall/Depression Screening: Fall Risk  07/27/2017 06/16/2017  Falls in the past year? Yes Yes  Number falls in past yr: 2 or more 2 or more  Injury with Fall? Yes Yes  Risk Factor Category  High Fall Risk High Fall Risk  Risk for fall due to : History of fall(s);Impaired vision;Impaired balance/gait;Medication side effect;Impaired mobility;Mental status change History of fall(s);Impaired balance/gait;Medication side effect;Impaired mobility;Mental status change  Follow up Falls evaluation completed;Falls prevention discussed;Education provided Falls evaluation completed;Falls prevention discussed   PHQ 2/9 Scores 07/27/2017 06/16/2017  Exception Documentation Other- indicate reason in comment box Other- indicate reason in comment box  Not  completed did not speak with patient, spoke with graddaughter due to patient's confusion call completed with dtr    [facility] CM Care [facility] Problem One     Most Recent Value  Care Plan Problem One  Knowledge deficient related to management of diabetes with her grandfather with increasing confusion  Role Documenting the Problem One  Health Coach  Care Plan for Problem One  Active  [facility]   Granddaughter will report a reduction of Hgb A1c by 0.5 points in the next 90 days.  [facility] Long Term Goal Start Date  07/27/17  Interventions for Problem One Long Term Goal  Reviewed current care plan and goals with granddaughter, encouraged  to keep and attend medical appointments, reviewed medications and encouraged medication compliance, discussedneed and reason for keppra and need to pick up prescription and discuss with physician patient has not been taking medication since discharge home, sending 2019 Calendar Booklet to help tack medical appointments and documentation of blood sugars an dialy weights, encouraged granddaughter to call primary care to request starting PT.OT to help with stregnthenng and gait training, falls precautions reviewed and discused, encouraged granddaughter to continue to monitor blood sugars at lest twice a day.  [facility] CM Short Term Goal #1   Granddaughter will review Living Well with Diabetes Booklet in the next 30 days.  [facility] CM Short Term Goal #1 Start Date  07/27/17  Interventions for Short Term Goal #1  Sending Living Well with Diabetes Booklet [employer] Packet, encouraged granddaughter to review packet, encouraged granddaughter to review booklet, reviewed low carbohydrate low sodium diet with granddaughter  [facility] CM Short Term Goal #2   Granddaughter will report fasting blood sugars greater than 90 in the next 30 days.  [facility] CM Short Term Goal #2 Start Date  07/27/17  Interventions for Short Term Goal #2  Encouraged granddaughter to review diabetes educational booklet, reviewed signs and symptoms of hper and hypoglycemia, discussed possibly symptomatic with losw blood dugar around 100 and encouragedgrandaughter to notify position for continued low fasting ranges below 100 to have insulin adjusted more     Appointments:  Reports patient attended primary care provider appointment on 06/17/2017 and is not due to see primary care again until December 2019.  Has scheduled appointment with Neurologist, Dr. Anne HahnWillis on 08/20/2017 and needs to schedule Cardiologist follow up after Neurologist sees.  Advanced Directives:  Reports patient has Living Will, Health Care Power of Attorney and Yellow DNR orders in the home.  Does  not wish to make any changes at this time.  Encouraged granddaughter to place yellow DNR form on fridge to be visible for all medical staff.   Consent:  [facility] reviewed and discussed with granddaughter, she verbally agrees to monthly telephone outreaches.  Plan: RN Health Coach will send primary MD barriers letter. RN Health Coach will route initial telephone assessment note to primary MD. RN Health Coach will send patient [facility] Welcome Packet. RN Health Coach will send patient Living Well with Diabetes Packet. RN Health Coach will send patient 2019 Calendar Booklet. RN Health Coach will make next monthly outreach to patient in the month of August.  Rhae LernerFarrah Anyah Swallow RN [facility] Care Management  RN Health Coach [phone] [email or web address]

## 2017-07-30 ENCOUNTER — Emergency Department (HOSPITAL_BASED_OUTPATIENT_CLINIC_OR_DEPARTMENT_OTHER): Payer: Medicare Other

## 2017-07-30 ENCOUNTER — Encounter (HOSPITAL_BASED_OUTPATIENT_CLINIC_OR_DEPARTMENT_OTHER): Payer: Self-pay | Admitting: Emergency Medicine

## 2017-07-30 ENCOUNTER — Other Ambulatory Visit: Payer: Self-pay

## 2017-07-30 ENCOUNTER — Inpatient Hospital Stay (HOSPITAL_BASED_OUTPATIENT_CLINIC_OR_DEPARTMENT_OTHER)
Admission: EM | Admit: 2017-07-30 | Discharge: 2017-08-12 | DRG: 082 | Disposition: A | Discharge status: Skilled Nursing Facility | Payer: Medicare Other | Attending: Internal Medicine | Admitting: Internal Medicine

## 2017-07-30 DIAGNOSIS — Z515 Encounter for palliative care: Secondary | ICD-10-CM | POA: Diagnosis not present

## 2017-07-30 DIAGNOSIS — Z888 Allergy status to other drugs, medicaments and biological substances status: Secondary | ICD-10-CM

## 2017-07-30 DIAGNOSIS — I110 Hypertensive heart disease with heart failure: Secondary | ICD-10-CM | POA: Diagnosis present

## 2017-07-30 DIAGNOSIS — I6529 Occlusion and stenosis of unspecified carotid artery: Secondary | ICD-10-CM | POA: Diagnosis present

## 2017-07-30 DIAGNOSIS — G9341 Metabolic encephalopathy: Secondary | ICD-10-CM | POA: Diagnosis present

## 2017-07-30 DIAGNOSIS — N183 Chronic kidney disease, stage 3 (moderate): Secondary | ICD-10-CM | POA: Diagnosis present

## 2017-07-30 DIAGNOSIS — I779 Disorder of arteries and arterioles, unspecified: Secondary | ICD-10-CM | POA: Diagnosis present

## 2017-07-30 DIAGNOSIS — Z7401 Bed confinement status: Secondary | ICD-10-CM | POA: Diagnosis not present

## 2017-07-30 DIAGNOSIS — R400 Somnolence: Secondary | ICD-10-CM | POA: Diagnosis not present

## 2017-07-30 DIAGNOSIS — Z87891 Personal history of nicotine dependence: Secondary | ICD-10-CM

## 2017-07-30 DIAGNOSIS — F0390 Unspecified dementia without behavioral disturbance: Secondary | ICD-10-CM | POA: Diagnosis present

## 2017-07-30 DIAGNOSIS — N390 Urinary tract infection, site not specified: Secondary | ICD-10-CM | POA: Diagnosis not present

## 2017-07-30 DIAGNOSIS — I10 Essential (primary) hypertension: Secondary | ICD-10-CM | POA: Diagnosis present

## 2017-07-30 DIAGNOSIS — R627 Adult failure to thrive: Secondary | ICD-10-CM | POA: Diagnosis not present

## 2017-07-30 DIAGNOSIS — Z7189 Other specified counseling: Secondary | ICD-10-CM | POA: Diagnosis not present

## 2017-07-30 DIAGNOSIS — I4510 Unspecified right bundle-branch block: Secondary | ICD-10-CM | POA: Diagnosis present

## 2017-07-30 DIAGNOSIS — I4892 Unspecified atrial flutter: Secondary | ICD-10-CM | POA: Diagnosis present

## 2017-07-30 DIAGNOSIS — I4891 Unspecified atrial fibrillation: Secondary | ICD-10-CM | POA: Diagnosis present

## 2017-07-30 DIAGNOSIS — Z79899 Other long term (current) drug therapy: Secondary | ICD-10-CM

## 2017-07-30 DIAGNOSIS — S065X9A Traumatic subdural hemorrhage with loss of consciousness of unspecified duration, initial encounter: Principal | ICD-10-CM | POA: Diagnosis present

## 2017-07-30 DIAGNOSIS — I252 Old myocardial infarction: Secondary | ICD-10-CM

## 2017-07-30 DIAGNOSIS — I480 Paroxysmal atrial fibrillation: Secondary | ICD-10-CM | POA: Diagnosis not present

## 2017-07-30 DIAGNOSIS — B952 Enterococcus as the cause of diseases classified elsewhere: Secondary | ICD-10-CM | POA: Diagnosis present

## 2017-07-30 DIAGNOSIS — W19XXXA Unspecified fall, initial encounter: Secondary | ICD-10-CM | POA: Diagnosis present

## 2017-07-30 DIAGNOSIS — M2550 Pain in unspecified joint: Secondary | ICD-10-CM | POA: Diagnosis not present

## 2017-07-30 DIAGNOSIS — R7989 Other specified abnormal findings of blood chemistry: Secondary | ICD-10-CM | POA: Diagnosis not present

## 2017-07-30 DIAGNOSIS — Z66 Do not resuscitate: Secondary | ICD-10-CM | POA: Diagnosis present

## 2017-07-30 DIAGNOSIS — R778 Other specified abnormalities of plasma proteins: Secondary | ICD-10-CM

## 2017-07-30 DIAGNOSIS — E0865 Diabetes mellitus due to underlying condition with hyperglycemia: Secondary | ICD-10-CM | POA: Diagnosis not present

## 2017-07-30 DIAGNOSIS — E113299 Type 2 diabetes mellitus with mild nonproliferative diabetic retinopathy without macular edema, unspecified eye: Secondary | ICD-10-CM | POA: Diagnosis present

## 2017-07-30 DIAGNOSIS — R748 Abnormal levels of other serum enzymes: Secondary | ICD-10-CM | POA: Diagnosis not present

## 2017-07-30 DIAGNOSIS — I130 Hypertensive heart and chronic kidney disease with heart failure and stage 1 through stage 4 chronic kidney disease, or unspecified chronic kidney disease: Secondary | ICD-10-CM | POA: Diagnosis not present

## 2017-07-30 DIAGNOSIS — R402142 Coma scale, eyes open, spontaneous, at arrival to emergency department: Secondary | ICD-10-CM | POA: Diagnosis present

## 2017-07-30 DIAGNOSIS — R4182 Altered mental status, unspecified: Secondary | ICD-10-CM | POA: Diagnosis present

## 2017-07-30 DIAGNOSIS — Z79890 Hormone replacement therapy (postmenopausal): Secondary | ICD-10-CM | POA: Diagnosis not present

## 2017-07-30 DIAGNOSIS — R32 Unspecified urinary incontinence: Secondary | ICD-10-CM | POA: Diagnosis not present

## 2017-07-30 DIAGNOSIS — R011 Cardiac murmur, unspecified: Secondary | ICD-10-CM | POA: Diagnosis present

## 2017-07-30 DIAGNOSIS — S065XAA Traumatic subdural hemorrhage with loss of consciousness status unknown, initial encounter: Secondary | ICD-10-CM | POA: Diagnosis present

## 2017-07-30 DIAGNOSIS — I5032 Chronic diastolic (congestive) heart failure: Secondary | ICD-10-CM | POA: Diagnosis present

## 2017-07-30 DIAGNOSIS — Z885 Allergy status to narcotic agent status: Secondary | ICD-10-CM

## 2017-07-30 DIAGNOSIS — R402362 Coma scale, best motor response, obeys commands, at arrival to emergency department: Secondary | ICD-10-CM | POA: Diagnosis not present

## 2017-07-30 DIAGNOSIS — I6200 Nontraumatic subdural hemorrhage, unspecified: Secondary | ICD-10-CM | POA: Diagnosis not present

## 2017-07-30 DIAGNOSIS — Z833 Family history of diabetes mellitus: Secondary | ICD-10-CM

## 2017-07-30 DIAGNOSIS — IMO0002 Reserved for concepts with insufficient information to code with codable children: Secondary | ICD-10-CM | POA: Diagnosis present

## 2017-07-30 DIAGNOSIS — R402242 Coma scale, best verbal response, confused conversation, at arrival to emergency department: Secondary | ICD-10-CM | POA: Diagnosis present

## 2017-07-30 DIAGNOSIS — E1122 Type 2 diabetes mellitus with diabetic chronic kidney disease: Secondary | ICD-10-CM | POA: Diagnosis present

## 2017-07-30 DIAGNOSIS — Z955 Presence of coronary angioplasty implant and graft: Secondary | ICD-10-CM

## 2017-07-30 DIAGNOSIS — I2510 Atherosclerotic heart disease of native coronary artery without angina pectoris: Secondary | ICD-10-CM | POA: Diagnosis present

## 2017-07-30 DIAGNOSIS — E1165 Type 2 diabetes mellitus with hyperglycemia: Secondary | ICD-10-CM | POA: Diagnosis present

## 2017-07-30 DIAGNOSIS — Z794 Long term (current) use of insulin: Secondary | ICD-10-CM

## 2017-07-30 DIAGNOSIS — I739 Peripheral vascular disease, unspecified: Secondary | ICD-10-CM

## 2017-07-30 LAB — GLUCOSE, CAPILLARY
GLUCOSE-CAPILLARY: 190 mg/dL — AB (ref 70–99)
Glucose-Capillary: 188 mg/dL — ABNORMAL HIGH (ref 70–99)

## 2017-07-30 LAB — CBC WITH DIFFERENTIAL/PLATELET
Basophils Absolute: 0 10*3/uL (ref 0.0–0.1)
Basophils Relative: 1 %
Eosinophils Absolute: 0.2 10*3/uL (ref 0.0–0.7)
Eosinophils Relative: 3 %
HEMATOCRIT: 41.9 % (ref 39.0–52.0)
Hemoglobin: 14.4 g/dL (ref 13.0–17.0)
LYMPHS ABS: 1.5 10*3/uL (ref 0.7–4.0)
LYMPHS PCT: 23 %
MCH: 31.7 pg (ref 26.0–34.0)
MCHC: 34.4 g/dL (ref 30.0–36.0)
MCV: 92.3 fL (ref 78.0–100.0)
MONO ABS: 0.6 10*3/uL (ref 0.1–1.0)
MONOS PCT: 8 %
NEUTROS ABS: 4.4 10*3/uL (ref 1.7–7.7)
Neutrophils Relative %: 65 %
PLATELETS: 165 10*3/uL (ref 150–400)
RBC: 4.54 MIL/uL (ref 4.22–5.81)
RDW: 13.1 % (ref 11.5–15.5)
WBC: 6.7 10*3/uL (ref 4.0–10.5)

## 2017-07-30 LAB — COMPREHENSIVE METABOLIC PANEL
ALT: 12 U/L (ref 0–44)
ANION GAP: 9 (ref 5–15)
AST: 21 U/L (ref 15–41)
Albumin: 3.5 g/dL (ref 3.5–5.0)
Alkaline Phosphatase: 66 U/L (ref 38–126)
BILIRUBIN TOTAL: 0.8 mg/dL (ref 0.3–1.2)
BUN: 22 mg/dL (ref 8–23)
CHLORIDE: 100 mmol/L (ref 98–111)
CO2: 25 mmol/L (ref 22–32)
Calcium: 8.8 mg/dL — ABNORMAL LOW (ref 8.9–10.3)
Creatinine, Ser: 1.4 mg/dL — ABNORMAL HIGH (ref 0.61–1.24)
GFR, EST AFRICAN AMERICAN: 48 mL/min — AB (ref >60)
GFR, EST NON AFRICAN AMERICAN: 42 mL/min — AB (ref >60)
Glucose, Bld: 271 mg/dL — ABNORMAL HIGH (ref 70–99)
POTASSIUM: 4.3 mmol/L (ref 3.5–5.1)
Sodium: 134 mmol/L — ABNORMAL LOW (ref 135–145)
TOTAL PROTEIN: 7.9 g/dL (ref 6.5–8.1)

## 2017-07-30 LAB — URINALYSIS, MICROSCOPIC (REFLEX): WBC, UA: >50 WBC/hpf (ref 0–5)

## 2017-07-30 LAB — I-STAT CG4 LACTIC ACID, ED: LACTIC ACID, VENOUS: 1.59 mmol/L (ref 0.5–1.9)

## 2017-07-30 LAB — URINALYSIS, ROUTINE W REFLEX MICROSCOPIC
Bilirubin Urine: NEGATIVE
Glucose, UA: >=500 mg/dL — AB
Ketones, ur: NEGATIVE mg/dL
NITRITE: NEGATIVE
PROTEIN: 30 mg/dL — AB
Specific Gravity, Urine: 1.025 (ref 1.005–1.030)
pH: 5.5 (ref 5.0–8.0)

## 2017-07-30 LAB — TROPONIN I: Troponin I: 0.1 ng/mL (ref <0.03)

## 2017-07-30 LAB — CBG MONITORING, ED: Glucose-Capillary: 286 mg/dL — ABNORMAL HIGH (ref 70–99)

## 2017-07-30 LAB — AMMONIA: AMMONIA: 18 umol/L (ref 9–35)

## 2017-07-30 MED ORDER — SODIUM CHLORIDE 0.9 % IV SOLN
1000.0000 mL | Freq: Once | INTRAVENOUS | Status: AC
Start: 2017-07-30 — End: 2017-07-30
  Administered 2017-07-30: 1000 mL via INTRAVENOUS

## 2017-07-30 MED ORDER — ONDANSETRON HCL 4 MG PO TABS: 4.0000 mg | ORAL_TABLET | Freq: Four times a day (QID) | ORAL | Status: DC | PRN

## 2017-07-30 MED ORDER — INSULIN ASPART 100 UNIT/ML ~~LOC~~ SOLN: 0.0000 [IU] | SUBCUTANEOUS | Status: DC

## 2017-07-30 MED ORDER — DEXTROSE-NACL 5-0.9 % IV SOLN
INTRAVENOUS | Status: DC
  Administered 2017-07-31: via INTRAVENOUS

## 2017-07-30 MED ORDER — ONDANSETRON HCL 4 MG/2ML IJ SOLN: 4.0000 mg | Freq: Four times a day (QID) | INTRAMUSCULAR | Status: DC | PRN

## 2017-07-30 MED ORDER — ACETAMINOPHEN 650 MG RE SUPP: 650.0000 mg | Freq: Four times a day (QID) | RECTAL | Status: DC | PRN

## 2017-07-30 MED ORDER — POLYETHYLENE GLYCOL 3350 17 G PO PACK: 17.0000 g | PACK | Freq: Every day | ORAL | Status: DC | PRN

## 2017-07-30 MED ORDER — ACETAMINOPHEN 325 MG PO TABS
650.0000 mg | ORAL_TABLET | Freq: Four times a day (QID) | ORAL | Status: DC | PRN
  Administered 2017-08-10: 650 mg via ORAL
  Filled 2017-07-30: qty 2

## 2017-07-30 NOTE — H&P (Signed)
History and Physical    Levi Robinson:[id number] DOB: 1921/09/18 DOA: 07/30/2017  PCP: Georgianne Fick, MD   Patient coming from: Home   Chief Complaint: Altered Mental status  HPI: Levi Robinson is a 84 y.o. male with medical history significant for DM,  HTN, recent acute subdural hematoma, carotid and coronary  disease, CHF, paroxysmal atrial fibrillation.  Patient is asleep, arouses to voice, but appears lethargic .  History is obtained from family- 2 granddaughters who are at bedside. Patient was brought to the ED by family with complaints of altered mental status.  Over the past 5 days patient has physically and cognitively declined, not responding to questions as before, has not been able to ambulate, or feed himself.    Recent hospital admission 5/24- 06/09/17 after a fall at home with CT head showing significant right subdural hematoma with right greater than left shift.  And initially obtunded, improved.  Family declined any invasive procedures.  Patient was discharged to home.  Since discharge patient's granddaughter, who patient resides with, reports gradual decline in mental status that would improve, but report he has not being this weak for this long.  Patient was previously able to use his walker, not ambulates with wheelchair.  Family reports reduced appetite.  No nausea vomiting or loose stools.  ED Course: Mild intermittent tachypnea to 23, systolic blood pressure 93 improved to 140s.  WBC 6.7.  Troponin mildly elevated 0.1.  Creatinine 1.4 improved compared to baseline.  EKG sinus rhythm.  UA-moderate leukocytes, many bacteria.  Head CT-large and right acute on chronic subdural hematoma now measuring 2.8 cm in maximum diameter previously 2.1 cm.  Resultant increased right-to-left midline shift now measuring 8 mm previously 3 mm.  Neurosurgery was consulted in the ED. Granddaughters- [name] (HCPOA) and [name]-talked to Dr. Franky Macho on the phone.  Family still declined  surgery. Palliative care consulted in the ED-follow as inpatient.   Review of Systems: Unable to obtain due to patient's mental status.  Past Medical History:  Diagnosis Date  . Anemia    a. Felt to be iatrogenic from procedures/sticks 06/2011  . Arthritis   . CAD (coronary artery disease)    a. NSTEMI s/p complex PCI to prox Cx 07/03/11  . Carotid arterial disease (HCC)    a. By MRI 05/2011;  b. 06/2011 Carotid U/S: LICA 80-99%, RICA 100%, patent vertebrals.  . Diabetes mellitus   . Encephalopathy    AMS during 06/2011 hospitalization - MRI of brain without infarct but did show R carotid dz, abnl EEG with nonspecific encephalopathy, felt secondary to drugs  . Hypertension   . Myocardial infarction (HCC)   . Transient atrial fibrillation or flutter    Brief WCT during hospitalization 06/2011 felt to be afib with abberation, not a good coumadin candidate    Past Surgical History:  Procedure Laterality Date  . APPENDECTOMY  1948  . LEFT HEART CATHETERIZATION WITH CORONARY ANGIOGRAM N/A 07/01/2011   Procedure: LEFT HEART CATHETERIZATION WITH CORONARY ANGIOGRAM;  Surgeon: Herby Abraham, MD;  Location: [facility] CATH LAB;  Service: Cardiovascular;  Laterality: N/A;  . PERCUTANEOUS CORONARY STENT INTERVENTION (PCI-S) N/A 07/03/2011   Procedure: PERCUTANEOUS CORONARY STENT INTERVENTION (PCI-S);  Surgeon: Herby Abraham, MD;  Location: [facility] CATH LAB;  Service: Cardiovascular;  Laterality: N/A;  . thumb surgery  1975   left     reports that he quit smoking about 31 years ago. His smoking use included cigarettes. He has never used smokeless tobacco. He  reports that he does not drink alcohol or use drugs.  Allergies  Allergen Reactions  . Ativan [Lorazepam] Other (See Comments)    agitation  . Fentanyl Nausea And Vomiting    Family History  Problem Relation Age of Onset  . Diabetes Mother   . Cancer Father   . Cancer Sister   . Cancer Brother   . Cancer Daughter     Prior to Admission  medications   Medication Sig Start Date End Date Taking? Authorizing Provider  carvedilol (COREG) 12.5 MG tablet Take 6.25 mg by mouth 2 (two) times daily with a meal.    [provider]  diphenhydrAMINE (BENADRYL) 25 MG tablet Take 25 mg by mouth at bedtime as needed.    [provider]  fish oil-omega-3 fatty acids 1000 MG capsule Take 1 g by mouth 3 (three) times daily.     [provider]  furosemide (LASIX) 40 MG tablet TAKE 1 TABLET BY MOUTH TWICE A DAY Patient not taking: Reported on 06/15/2017 04/24/17   Levi Hazel, MD  furosemide (LASIX) 40 MG tablet Take 20 mg by mouth See admin instructions. Alternate taking 1 tablet in the morning one day then take 1 tablet in the morning and 1 tablet in the afternoon the other day    [provider]  insulin detemir (LEVEMIR) 100 UNIT/ML injection Inject 48 Units into the skin daily. Inject 16-20 units at dinner if needed. 07/04/11   Dunn, Tacey Ruiz, PA-C  isosorbide mononitrate (IMDUR) 30 MG 24 hr tablet Take 30 mg by mouth daily after lunch. 07/04/11   Dunn, Tacey Ruiz, PA-C  ketoconazole (NIZORAL) 2 % cream Apply 1 application topically as needed. For rash 07/28/11   [provider]  levETIRAcetam (KEPPRA) 500 MG tablet Take 1 tablet (500 mg total) by mouth 2 (two) times daily. Patient not taking: Reported on 07/27/2017 06/15/17   Arby Barrette, MD  levothyroxine (SYNTHROID, LEVOTHROID) 50 MCG tablet Take 1 tablet (50 mcg total) by mouth daily. 06/22/14   Levi Hazel, MD  nitroGLYCERIN (NITROSTAT) 0.4 MG SL tablet Place 1 tablet (0.4 mg total) under the tongue every 5 (five) minutes x 3 doses as needed for chest pain. 07/04/11   Dunn, Tacey Ruiz, PA-C  ondansetron (ZOFRAN ODT) 4 MG disintegrating tablet 4mg  ODT q4 hours prn nausea/vomit Patient taking differently: Take 4 mg by mouth every 4 (four) hours as needed for nausea or vomiting.  04/15/17   Melene Plan, DO  pantoprazole (PROTONIX) 40 MG tablet  Take 40 mg by mouth daily after lunch.     [provider]  potassium chloride (K-DUR) 10 MEQ tablet Take 5-10 mEq by mouth See admin instructions. Take 1 tablet every morning and take 1/2 tablet every evening 11/07/13   [provider]  simvastatin (ZOCOR) 40 MG tablet Take 20 mg by mouth daily.     [provider]  Specialty Vitamins Products (PROSTATE PO) Take 600 mg by mouth daily. Super beta prostate, 600 mg     [provider]  traZODone (DESYREL) 50 MG tablet Take 0.5 tablets (25 mg total) by mouth at bedtime as needed for sleep. 06/10/17   Albertine Grates, MD    Physical Exam: Limited by patient's mental status Vitals:   07/30/17 1930 07/30/17 1955 07/30/17 2100 07/30/17 2100  BP: 137/63 (!) 143/38 (!) 143/65 (!) 143/65  Pulse: 67 76 72 72  Resp: 19 (!) 22 18 18   Temp:   98.5 F (36.9  C) 98.5 F (36.9 C)  TempSrc:   Oral Oral  SpO2: 97% 96%  99%  Weight:   63.2 kg (139 lb 5.3 oz)   Height:   5\' 10"  (1.778 m)     Constitutional: Sleeping but arousable, appears lethargic, not responding to questions. Vitals:   07/30/17 1930 07/30/17 1955 07/30/17 2100 07/30/17 2100  BP: 137/63 (!) 143/38 (!) 143/65 (!) 143/65  Pulse: 67 76 72 72  Resp: 19 (!) 22 18 18   Temp:   98.5 F (36.9 C) 98.5 F (36.9 C)  TempSrc:   Oral Oral  SpO2: 97% 96%  99%  Weight:   63.2 kg (139 lb 5.3 oz)   Height:   5\' 10"  (1.778 m)    Eyes: Patient closing his eyes tightly unable to examine ENMT: Mucous membranes are dry.  Neck: normal, no thyromegaly Respiratory: clear to auscultation bilaterally, no wheezing, no crackles. Normal respiratory effort. No accessory muscle use.  Cardiovascular: Regular rate and rhythm, no murmurs / rubs / gallops. No extremity edema. 2+ pedal pulses. No carotid bruits.  Abdomen: no tenderness, no masses palpated. No hepatosplenomegaly. Bowel sounds positive.  Musculoskeletal: no clubbing / cyanosis. No joint deformity upper and lower  extremities. Good ROM, no contractures. Normal muscle tone.  Skin: no rashes, lesions, ulcers. No induration Neurologic: Unable to fully examine, moving extremities Psychiatric: Unable to fully examine due to patient's mental status  Labs on Admission: I have personally reviewed following labs and imaging studies  CBC: Recent Labs  Lab 07/30/17 1612  WBC 6.7  NEUTROABS 4.4  HGB 14.4  HCT 41.9  MCV 92.3  PLT 165   Basic Metabolic Panel: Recent Labs  Lab 07/30/17 1612  NA 134*  K 4.3  CL 100  CO2 25  GLUCOSE 271*  BUN 22  CREATININE 1.40*  CALCIUM 8.8*   Liver Function Tests: Recent Labs  Lab 07/30/17 1612  AST 21  ALT 12  ALKPHOS 66  BILITOT 0.8  PROT 7.9  ALBUMIN 3.5    Recent Labs  Lab 07/30/17 1612  AMMONIA 18   Cardiac Enzymes: Recent Labs  Lab 07/30/17 1612  TROPONINI 0.10*   CBG: Recent Labs  Lab 07/30/17 1502  GLUCAP 286*   Urine analysis:    Component Value Date/Time   COLORURINE YELLOW 07/30/2017 1612   APPEARANCEUR CLOUDY (A) 07/30/2017 1612   LABSPEC 1.025 07/30/2017 1612   PHURINE 5.5 07/30/2017 1612   GLUCOSEU >=500 (A) 07/30/2017 1612   HGBUR MODERATE (A) 07/30/2017 1612   BILIRUBINUR NEGATIVE 07/30/2017 1612   KETONESUR NEGATIVE 07/30/2017 1612   PROTEINUR 30 (A) 07/30/2017 1612   UROBILINOGEN 0.2 06/08/2013 0424   NITRITE NEGATIVE 07/30/2017 1612   LEUKOCYTESUR MODERATE (A) 07/30/2017 1612    Radiological Exams on Admission: Dg Chest 2 View  Result Date: 07/30/2017 CLINICAL DATA:  Altered mental status for the past 4 days. EXAM: CHEST - 2 VIEW COMPARISON:  Chest x-ray dated Jun 05, 2017. FINDINGS: Stable cardiac silhouette, at the upper limits of normal in size. Normal pulmonary vascularity. Stable left greater than right bibasilar atelectasis/scarring. No focal consolidation, pleural effusion, or pneumothorax. No acute osseous abnormality. IMPRESSION: Bibasilar atelectasis/scarring, unchanged. No active cardiopulmonary  disease. Electronically Signed   By: Obie DredgeWilliam T Derry M.D.   On: 07/30/2017 16:02   Ct Head Wo Contrast  Result Date: 07/30/2017 CLINICAL DATA:  Altered mental status. EXAM: CT HEAD WITHOUT CONTRAST TECHNIQUE: Contiguous axial images were obtained from the base of the skull through the  vertex without intravenous contrast. COMPARISON:  CT head dated June 15, 2017. FINDINGS: Brain: Enlarging, predominantly isodense right subdural hematoma, now measuring up to 2.8 cm in maximal diameter, previously 2.1 cm. There are areas of layering hyperdensity posteriorly, consistent with recent hemorrhage. Increased right to left midline shift, now measuring 8 mm, previously 3 mm. There is near complete effacement of the right lateral ventricle with minimally increased entrapment of the left lateral ventricle. Worsening right cerebral hemisphere sulcal effacement. Basal cisterns remain patent. Resolved left subdural hygroma. No evidence of acute infarction. Stable chronic microvascular ischemic changes. Vascular: Atherosclerotic vascular calcification of the carotid siphons. No hyperdense vessel. Skull: Normal. Negative for fracture or focal lesion. Sinuses/Orbits: No acute finding. Other: None. IMPRESSION: 1. Enlarging right acute on chronic subdural hematoma, now measuring 2.8 cm in maximal diameter, previously 2.1 cm. Resultant increased right-to-left midline shift, now measuring 8 mm, previously 3 mm. 2. Increased right cerebral hemisphere sulcal effacement with near complete effacement of the right lateral ventricle and minimally increased entrapment of the left lateral ventricle. 3. Resolved left subdural hygroma. Critical Value/emergent results were called by telephone at the time of interpretation on 07/30/2017 at 3:51 pm to Dr. Arthor Captain , who verbally acknowledged these results. Electronically Signed   By: Obie Dredge M.D.   On: 07/30/2017 15:53    EKG: Independently reviewed.  Sinus  rhythm.  Assessment/Plan Principal Problem:   Altered mental status Active Problems:   CAD (coronary artery disease)   Carotid arterial disease (HCC)   Nonproliferative diabetic retinopathy (HCC)   Atrial fibrillation with RVR (HCC)   Subdural hematoma, acute (HCC)   Uncontrolled diabetes mellitus (HCC)   DNR (do not resuscitate)   Benign essential HTN  Altered mental status-likely due to enlarging subdural hematoma, with resultant increased right to left midline shift.  Neurosurgery consulted in ED - Dr Franky Macho, family has declined surgery.  Family reconfirmed at bedside that they do not want surgery. -Palliative care consult a.m. -N.p.o. for now -UA suggest infection, f/u urine cultures -Ceftriaxone 1 g daily -Normal saline 75 cc/h x 12 hours  Elevated troponin-0.1. EKG without ST or T wave abnormalities.  Family denies complaints of chest pain. ?  Possibly related to intracranial hemorrhage. Family has opted to trend troponin, but I explained patient not be anticoagulated with ICH, and unlikely to get cardiac intervention if actual CAD\ACS - Trops X 2  DM-last hemoglobin A1c 9.4 05/2017.  Granddaughter Archie Patten requests that we do not use NovoLog sliding scale, reports her father is very sensitive blood sugars dropped to as low as 44.  She requests using Levemir only. -Continue home Levemir at reduced dose 15 units while n.p.o.  Paroxysmal atrial fibrillation, diastolic CHF, CAD hx-last echo 2013, EF 65- 70%.  Appears stable. -Hold diuretics while n.p.o. -Hold home Lasix for now, gentle hydration   DVT prophylaxis: SCds Code Status: DNR Family Communication: 2 granddaughters- [name] ( HCPOA), Producer, television/film/video (pt resides with) Disposition Plan: Per rounding team Consults called: Palliative care consult a.m. Admission status: Inpatient MedSurg   Onnie Boer MD [facility] Pager [phone] From 6PM-2AM.  Otherwise please contact  night-coverage [email or web address] Password [email or web address]  07/30/2017, 10:45 PM

## 2017-07-30 NOTE — ED Notes (Signed)
ED Provider at bedside discussing test results and plan of care with pts family.

## 2017-07-30 NOTE — ED Triage Notes (Addendum)
Family reports AMS since Sunday. Pt normally is CAO x 3, walks and feeds self. She states he has not been able to walk or feed himself. Pt treated for a head injury in May. Pt is slow to respond

## 2017-07-30 NOTE — ED Provider Notes (Signed)
[facility] EMERGENCY DEPARTMENT Provider Note   CSN: [id number] Arrival date & time: 07/30/17  1440     History   Chief Complaint Chief Complaint  Patient presents with  . Altered Mental Status    HPI XHAIDEN COOMBS is a 84 y.o. male.  In by his family for altered mental status.  Patient had a fall and had a subdural hematoma.  He was consulted on by Dr. Franky Macho on 06/15/2017.  At that time there was plenty of room for expansion and the family declined any surgical intervention.  They were aware that he may have some intermittent altered mental status.  They states that at times he has been somewhat confused and noncommunicative however in the past it is only lasted for several hours at a time.  For the past 4 days the patient has had a significant decline in his mental status.  Normally he is talkative and has been noncommunicative, not feeding himself, sleeping a lot.  His family brought him in for evaluation today.  He has not had any other falls.  He has not been running fevers. HPI  Past Medical History:  Diagnosis Date  . Anemia    a. Felt to be iatrogenic from procedures/sticks 06/2011  . Arthritis   . CAD (coronary artery disease)    a. NSTEMI s/p complex PCI to prox Cx 07/03/11  . Carotid arterial disease (HCC)    a. By MRI 05/2011;  b. 06/2011 Carotid U/S: LICA 80-99%, RICA 100%, patent vertebrals.  . Diabetes mellitus   . Encephalopathy    AMS during 06/2011 hospitalization - MRI of brain without infarct but did show R carotid dz, abnl EEG with nonspecific encephalopathy, felt secondary to drugs  . Hypertension   . Myocardial infarction (HCC)   . Transient atrial fibrillation or flutter    Brief WCT during hospitalization 06/2011 felt to be afib with abberation, not a good coumadin candidate    Patient Active Problem List   Diagnosis Date Noted  . PAF (paroxysmal atrial fibrillation) (HCC)   . Benign essential HTN   . Stage 3 chronic kidney disease (HCC)   .  Atrial fibrillation with RVR (HCC) 06/05/2017  . Subdural hematoma, acute (HCC) 06/05/2017  . Altered mental status 06/05/2017  . Acute CHF (congestive heart failure) (HCC) 06/05/2017  . Fall 06/05/2017  . Uncontrolled diabetes mellitus (HCC) 06/05/2017  . DNR (do not resuscitate) 06/05/2017  . Subdural hematoma (HCC) 06/05/2017  . Occlusion and stenosis of carotid artery without mention of cerebral infarction 08/04/2011  . Hypertension   . CAD (coronary artery disease)   . Carotid arterial disease (HCC)   . Anemia   . Posterior vitreous detachment 06/05/2011  . Status post intraocular lens implant 06/05/2011  . Branch retinal vein occlusion 12/12/2010  . Nonproliferative diabetic retinopathy (HCC) 12/12/2010    Past Surgical History:  Procedure Laterality Date  . APPENDECTOMY  1948  . LEFT HEART CATHETERIZATION WITH CORONARY ANGIOGRAM N/A 07/01/2011   Procedure: LEFT HEART CATHETERIZATION WITH CORONARY ANGIOGRAM;  Surgeon: Herby Abraham, MD;  Location: [facility] CATH LAB;  Service: Cardiovascular;  Laterality: N/A;  . PERCUTANEOUS CORONARY STENT INTERVENTION (PCI-S) N/A 07/03/2011   Procedure: PERCUTANEOUS CORONARY STENT INTERVENTION (PCI-S);  Surgeon: Herby Abraham, MD;  Location: [facility] CATH LAB;  Service: Cardiovascular;  Laterality: N/A;  . thumb surgery  1975   left        Home Medications    Prior to Admission medications   Medication  Sig Start Date End Date Taking? Authorizing Provider  carvedilol (COREG) 12.5 MG tablet Take 6.25 mg by mouth 2 (two) times daily with a meal.    [provider]  diphenhydrAMINE (BENADRYL) 25 MG tablet Take 25 mg by mouth at bedtime as needed.    [provider]  fish oil-omega-3 fatty acids 1000 MG capsule Take 1 g by mouth 3 (three) times daily.     [provider]  furosemide (LASIX) 40 MG tablet TAKE 1 TABLET BY MOUTH TWICE A DAY Patient not taking: Reported on 06/15/2017 04/24/17   Kathleene Hazel, MD    furosemide (LASIX) 40 MG tablet Take 20 mg by mouth See admin instructions. Alternate taking 1 tablet in the morning one day then take 1 tablet in the morning and 1 tablet in the afternoon the other day    [provider]  insulin detemir (LEVEMIR) 100 UNIT/ML injection Inject 48 Units into the skin daily. Inject 16-20 units at dinner if needed. 07/04/11   Dunn, Tacey Ruiz, PA-C  isosorbide mononitrate (IMDUR) 30 MG 24 hr tablet Take 30 mg by mouth daily after lunch. 07/04/11   Dunn, Tacey Ruiz, PA-C  ketoconazole (NIZORAL) 2 % cream Apply 1 application topically as needed. For rash 07/28/11   [provider]  levETIRAcetam (KEPPRA) 500 MG tablet Take 1 tablet (500 mg total) by mouth 2 (two) times daily. Patient not taking: Reported on 07/27/2017 06/15/17   Arby Barrette, MD  levothyroxine (SYNTHROID, LEVOTHROID) 50 MCG tablet Take 1 tablet (50 mcg total) by mouth daily. 06/22/14   Kathleene Hazel, MD  nitroGLYCERIN (NITROSTAT) 0.4 MG SL tablet Place 1 tablet (0.4 mg total) under the tongue every 5 (five) minutes x 3 doses as needed for chest pain. 07/04/11   Dunn, Tacey Ruiz, PA-C  ondansetron (ZOFRAN ODT) 4 MG disintegrating tablet 4mg  ODT q4 hours prn nausea/vomit Patient taking differently: Take 4 mg by mouth every 4 (four) hours as needed for nausea or vomiting.  04/15/17   Melene Plan, DO  pantoprazole (PROTONIX) 40 MG tablet Take 40 mg by mouth daily after lunch.     [provider]  potassium chloride (K-DUR) 10 MEQ tablet Take 5-10 mEq by mouth See admin instructions. Take 1 tablet every morning and take 1/2 tablet every evening 11/07/13   [provider]  simvastatin (ZOCOR) 40 MG tablet Take 20 mg by mouth daily.     [provider]  Specialty Vitamins Products (PROSTATE PO) Take 600 mg by mouth daily. Super beta prostate, 600 mg     [provider]  traZODone (DESYREL) 50 MG tablet Take 0.5 tablets (25 mg total) by mouth at bedtime as needed for  sleep. 06/10/17   Albertine Grates, MD    Family History Family History  Problem Relation Age of Onset  . Diabetes Mother   . Cancer Father   . Cancer Sister   . Cancer Brother   . Cancer Daughter     Social History Social History   Tobacco Use  . Smoking status: Former Smoker    Types: Cigarettes    Last attempt to quit: 01/13/1986    Years since quitting: 31.5  . Smokeless tobacco: Never Used  Substance Use Topics  . Alcohol use: No  . Drug use: No     Allergies   Ativan [lorazepam] and Fentanyl   Review of Systems Review of Systems  Unable to perform ROS: Mental status change     Physical  Exam Updated Vital Signs BP (!) 93/54 (BP Location: Left Arm)   Pulse 81   Temp 97.6 F (36.4 C) (Oral)   Resp 18   Wt 72.6 kg (160 lb)   SpO2 98%   BMI 22.96 kg/m   Physical Exam  Constitutional: He appears well-developed and well-nourished. He appears lethargic. No distress.  HENT:  Head: Normocephalic and atraumatic.  Eyes: Conjunctivae are normal. No scleral icterus.  Neck: Normal range of motion. Neck supple.  Cardiovascular: Normal rate, regular rhythm and normal heart sounds.  Pulmonary/Chest: Effort normal and breath sounds normal. No respiratory distress.  Abdominal: Soft. There is no tenderness.  Musculoskeletal: He exhibits no edema.  Neurological: He has normal reflexes. He appears lethargic. GCS eye subscore is 4. GCS verbal subscore is 4. GCS motor subscore is 6.  Patient intermittently noncommunicative.  At times he will answer questions appropriately and at other times he only tracks you with his eyes but will not respond.  At times the patient will follow verbal commands and at other times he will not.  He has no other obvious neurologic deficits such as unilateral weakness, facial droop, disconjugate gaze, or anisocoria.  Skin: Skin is warm and dry. He is not diaphoretic.  Psychiatric: His behavior is normal.  Nursing note and vitals reviewed.    ED  Treatments / Results  Labs (all labs ordered are listed, but only abnormal results are displayed) Labs Reviewed  CBC WITH DIFFERENTIAL/PLATELET  COMPREHENSIVE METABOLIC PANEL  URINALYSIS, ROUTINE W REFLEX MICROSCOPIC  AMMONIA  TROPONIN I  I-STAT CG4 LACTIC ACID, ED    EKG EKG Interpretation  Date/Time:  Thursday July 30 2017 15:15:52 EDT Ventricular Rate:  66 PR Interval:    QRS Duration: 136 QT Interval:  429 QTC Calculation: 450 R Axis:   -87 Text Interpretation:  Sinus arrhythmia Ventricular premature complex Prolonged PR interval RBBB and LAFB LVH by voltage Baseline wander in lead(s) V2 V6 No significant change from 06/15/2017 Confirmed by Geoffery Lyons ([id number]) on 07/30/2017 3:41:41 PM   Radiology Dg Chest 2 View  Result Date: 07/30/2017 CLINICAL DATA:  Altered mental status for the past 4 days. EXAM: CHEST - 2 VIEW COMPARISON:  Chest x-ray dated Jun 05, 2017. FINDINGS: Stable cardiac silhouette, at the upper limits of normal in size. Normal pulmonary vascularity. Stable left greater than right bibasilar atelectasis/scarring. No focal consolidation, pleural effusion, or pneumothorax. No acute osseous abnormality. IMPRESSION: Bibasilar atelectasis/scarring, unchanged. No active cardiopulmonary disease. Electronically Signed   By: Obie Dredge M.D.   On: 07/30/2017 16:02   Ct Head Wo Contrast  Result Date: 07/30/2017 CLINICAL DATA:  Altered mental status. EXAM: CT HEAD WITHOUT CONTRAST TECHNIQUE: Contiguous axial images were obtained from the base of the skull through the vertex without intravenous contrast. COMPARISON:  CT head dated June 15, 2017. FINDINGS: Brain: Enlarging, predominantly isodense right subdural hematoma, now measuring up to 2.8 cm in maximal diameter, previously 2.1 cm. There are areas of layering hyperdensity posteriorly, consistent with recent hemorrhage. Increased right to left midline shift, now measuring 8 mm, previously 3 mm. There is near complete  effacement of the right lateral ventricle with minimally increased entrapment of the left lateral ventricle. Worsening right cerebral hemisphere sulcal effacement. Basal cisterns remain patent. Resolved left subdural hygroma. No evidence of acute infarction. Stable chronic microvascular ischemic changes. Vascular: Atherosclerotic vascular calcification of the carotid siphons. No hyperdense vessel. Skull: Normal. Negative for fracture or focal lesion. Sinuses/Orbits: No acute finding. Other: None. IMPRESSION: 1.  Enlarging right acute on chronic subdural hematoma, now measuring 2.8 cm in maximal diameter, previously 2.1 cm. Resultant increased right-to-left midline shift, now measuring 8 mm, previously 3 mm. 2. Increased right cerebral hemisphere sulcal effacement with near complete effacement of the right lateral ventricle and minimally increased entrapment of the left lateral ventricle. 3. Resolved left subdural hygroma. Critical Value/emergent results were called by telephone at the time of interpretation on 07/30/2017 at 3:51 pm to Dr. Arthor CaptainABIGAIL Montague Corella , who verbally acknowledged these results. Electronically Signed   By: Obie DredgeWilliam T Derry M.D.   On: 07/30/2017 15:53    Procedures .Critical Care Performed by: Arthor CaptainHarris, Keniyah Gelinas, PA-C Authorized by: Arthor CaptainHarris, Terez Freimark, PA-C   Critical care provider statement:    Critical care time (minutes):  60   Critical care was necessary to treat or prevent imminent or life-threatening deterioration of the following conditions:  CNS failure or compromise   Critical care was time spent personally by me on the following activities:  Blood draw for specimens, development of treatment plan with patient or surrogate, discussions with consultants, evaluation of patient's response to treatment, examination of patient, interpretation of cardiac output measurements, ordering and performing treatments and interventions, ordering and review of laboratory studies, ordering and review of  radiographic studies, pulse oximetry, re-evaluation of patient's condition and review of old charts   (including critical care time)  Medications Ordered in ED Medications  0.9 %  sodium chloride infusion (1,000 mLs Intravenous New Bag/Given 07/30/17 1620)     Initial Impression / Assessment and Plan / ED Course  I have reviewed the triage vital signs and the nursing notes.  Pertinent labs & imaging results that were available during my care of the patient were reviewed by me and considered in my medical decision making (see chart for details).  Clinical Course as of Jul 31 1927  Thu Jul 30, 2017  1712 Troponin I(!!): 0.10 [AH]    Clinical Course User Index [AH] Arthor CaptainHarris, Pansey Pinheiro, PA-C    Patient with worsening expanding hematoma.  I consulted with Dr. Lovell SheehanJenkins and Dr. Franky Machoabbell.  They both agree that the patient will need surgery or this is a terminal head bleed.  This discussion with the patient's granddaughter who is also able to speak directly with Dr. Franky Machoabbell.  Family was called to room and they made a shared decision at that point not to pursue surgery.  I spoke with Dr. Neale BurlyFreeman of palliative care he states that this patient is not a good candidate for them but that he would not be able to place the patient after 6 PM and asks for admission. I spoke with Dr. Mariea ClontsEmokpae who will admit the patient for palliative care admission.  Final Clinical Impressions(s) / ED Diagnoses   Final diagnoses:  None    ED Discharge Orders    None       Arthor CaptainHarris, Asa Fath, PA-C 07/30/17 1933    Geoffery Lyonselo, Douglas, MD 07/31/17 2348

## 2017-07-30 NOTE — ED Notes (Signed)
Urine sample and culture sent to the lab  

## 2017-07-30 NOTE — Progress Notes (Signed)
[facility], 84 year [facility] male, recent history of subdural hematoma 06/2017 and subsequent hospitalization.  Presented with altered mental status.  Head CT shows enlarging subdural hematoma.  Neurosurgery consulted in ED, family- granddaughter and son at bedside, who talked to Dr. Mikal Planeabell on the phone. Family declining surgery and opting for comfort care route.  Palliative care [facility] in [facility] ED, will follow as inpatient.  Also troponin mildly elevated 0.1.  Temporary admit order - inpatient, MedSurg bed.  Kennis CarinaEjiroghene Lynell Kussman, MD. TRH.

## 2017-07-31 DIAGNOSIS — Z515 Encounter for palliative care: Secondary | ICD-10-CM

## 2017-07-31 DIAGNOSIS — S065X9A Traumatic subdural hemorrhage with loss of consciousness of unspecified duration, initial encounter: Principal | ICD-10-CM

## 2017-07-31 LAB — GLUCOSE, CAPILLARY
Glucose-Capillary: 127 mg/dL — ABNORMAL HIGH (ref 70–99)
Glucose-Capillary: 128 mg/dL — ABNORMAL HIGH (ref 70–99)
Glucose-Capillary: 143 mg/dL — ABNORMAL HIGH (ref 70–99)
Glucose-Capillary: 148 mg/dL — ABNORMAL HIGH (ref 70–99)
Glucose-Capillary: 91 mg/dL (ref 70–99)
Glucose-Capillary: 92 mg/dL (ref 70–99)

## 2017-07-31 LAB — TROPONIN I
TROPONIN I: 0.11 ng/mL — AB (ref <0.03)
Troponin I: 0.11 ng/mL (ref <0.03)

## 2017-07-31 MED ORDER — SODIUM CHLORIDE 0.9 % IV SOLN
1.0000 g | INTRAVENOUS | Status: DC
  Administered 2017-07-31 – 2017-08-01 (×2): 1 g via INTRAVENOUS
  Filled 2017-07-31: qty 10
  Filled 2017-07-31: qty 1

## 2017-07-31 MED ORDER — ORAL CARE MOUTH RINSE
15.0000 mL | Freq: Two times a day (BID) | OROMUCOSAL | Status: DC
  Administered 2017-07-31 – 2017-08-12 (×21): 15 mL via OROMUCOSAL

## 2017-07-31 MED ORDER — INSULIN DETEMIR 100 UNIT/ML ~~LOC~~ SOLN: 15.0000 [IU] | Freq: Every day | SUBCUTANEOUS | Status: DC

## 2017-07-31 MED ORDER — DEXAMETHASONE SODIUM PHOSPHATE 10 MG/ML IJ SOLN
10.0000 mg | Freq: Once | INTRAMUSCULAR | Status: AC
  Administered 2017-07-31: 10 mg via INTRAVENOUS
  Filled 2017-07-31: qty 1

## 2017-07-31 MED ORDER — SODIUM CHLORIDE 0.9 % IV SOLN
INTRAVENOUS | Status: AC
  Administered 2017-07-31: 01:00:00 via INTRAVENOUS

## 2017-07-31 MED ORDER — INSULIN DETEMIR 100 UNIT/ML ~~LOC~~ SOLN
15.0000 [IU] | Freq: Every day | SUBCUTANEOUS | Status: DC
  Administered 2017-07-31 (×2): 15 [IU] via SUBCUTANEOUS
  Filled 2017-07-31 (×2): qty 0.15

## 2017-07-31 MED ORDER — LEVETIRACETAM IN NACL 500 MG/100ML IV SOLN
500.0000 mg | Freq: Two times a day (BID) | INTRAVENOUS | Status: DC
  Administered 2017-07-31 – 2017-08-04 (×9): 500 mg via INTRAVENOUS
  Filled 2017-07-31 (×9): qty 100

## 2017-07-31 NOTE — Consult Note (Signed)
   [facility] CM Inpatient Consult   07/31/2017  Levi Robinson 1921/06/17 [id number]  Patient is currently active with [facility] for chronic disease management services.  Patient has been engaged by a [employer] prior to admission.Our community based plan of care has focused on disease management and community resource support.  Patient will receive a post discharge transition of care call and will be evaluated for monthly home visits for assessments and disease process education.  Met with son-in-law at bedside, Levi Robinson at the bedside.  Gave him a brochure and 24 hour nurse advise line with contact infomation.He states that they are awaiting a Palliative consult and Levi Robinson is the POA. Patient remained asleep throughout visit, did not awaken. Admitted with Subdural Hematoma.   Made Inpatient Case Manager aware by Levi Robinson to [facility] that [place] [facility] following. Of note, [facility] Care Management services does not replace or interfere with any services that are needed or arranged by inpatient case management or social work.  Will also awaiting the outcome of Palliative Care consult. For additional questions or referrals please contact:  Levi Brood, RN BSN [facility] Liaison  [phone] business mobile phone Toll free office [phone]

## 2017-07-31 NOTE — Evaluation (Signed)
Clinical/Bedside Swallow Evaluation Patient Details  Name: Levi Robinson MRN: [id number] Date of Birth: May 26, 1921  Today's Date: 07/31/2017 Time: SLP Start Time (ACUTE ONLY): 51 SLP Stop Time (ACUTE ONLY): 1525 SLP Time Calculation (min) (ACUTE ONLY): 15 min  Past Medical History:  Past Medical History:  Diagnosis Date  . Anemia    a. Felt to be iatrogenic from procedures/sticks 06/2011  . Arthritis   . CAD (coronary artery disease)    a. NSTEMI s/p complex PCI to prox Cx 07/03/11  . Carotid arterial disease ([place])    a. By MRI 05/2011;  b. 0/1749 Carotid U/S: LICA 44-96%, RICA 759%, patent vertebrals.  . Diabetes mellitus   . Encephalopathy    AMS during 06/2011 hospitalization - MRI of brain without infarct but did show R carotid dz, abnl EEG with nonspecific encephalopathy, felt secondary to drugs  . Hypertension   . Myocardial infarction ([place])   . Transient atrial fibrillation or flutter    Brief WCT during hospitalization 06/2011 felt to be afib with abberation, not a good coumadin candidate   Past Surgical History:  Past Surgical History:  Procedure Laterality Date  . APPENDECTOMY  1948  . LEFT HEART CATHETERIZATION WITH CORONARY ANGIOGRAM N/A 07/01/2011   Procedure: LEFT HEART CATHETERIZATION WITH CORONARY ANGIOGRAM;  Surgeon: Hillary Bow, MD;  Location: [facility] CATH LAB;  Service: Cardiovascular;  Laterality: N/A;  . PERCUTANEOUS CORONARY STENT INTERVENTION (PCI-S) N/A 07/03/2011   Procedure: PERCUTANEOUS CORONARY STENT INTERVENTION (PCI-S);  Surgeon: Hillary Bow, MD;  Location[facility] CATH LAB;  Service: Cardiovascular;  Laterality: N/A;  . thumb surgery  1975   left   HPI:  Levi Robinson a 84 y.o.malewith medical history significantfor DM, HTN, recent acute subdural hematoma (05/2017),carotid and coronary disease,CHF,paroxysmalatrial fibrillation.Patientwas brought to the ED by family with complaints of altered mental status. Over the past 5 days patient  has physically and cognitively declined,not responding to questions as before,has not been able to ambulate, orfeed himself.Head CT-large and right acute on chronic subdural hematoma now measuring 2.8 cm in maximum diameter previously 2.1 cm.Resultant increased right-to-left midline shift now measuring 8 mm previously 3 mm.Neurosurgery was consulted in the ED.Granddaughters- [name] (HCPOA) and [name]-talked to [employer] on the phone.Family stilldeclined surgery. Palliativecare consulted in the ED-follow as inpatient.   Assessment / Plan / Recommendation Clinical Impression  ST consulted as family reports pt consumed crackers and water without issue in ED on 07/31/17. Per chart review, pt failed stroke swallow screen because he was not able to produce cough. Palliative Care met with family and continue to desire bedside swallow evaluation for possibility of consuming PO intake in efforts to help with resolution of UTI. According to chart, if no progress is made by 7/20 then Hospice is indicated. SLP attempted formal bedside swallow evaluation to assess abilitiy of returning to PO intake, provide education on possible aspiration risks should family choose comfort feeds. At time of evaluation, pt was sleeping peacefully with no family present. Pt was able to breifly arouse to Max verbal and tacitle cues for ~ 3 seconds. When aroused, pt with blank stare and didn't appear to visually engage with SLP. Given current decreased arousal he is not safe for PO intake and diet recommendation cannot be made at this time. Family was not present therefore education could not be provided on aspiration risks should pt's family consider PO intake if he is able doemonstrate moments of arousal.  ST to follow briefly for possible improvement in mentation and  arousal.  SLP Visit Diagnosis: Dysphagia, unspecified (R13.10)    Aspiration Risk  Risk for inadequate nutrition/hydration;Severe aspiration risk    Diet  Recommendation NPO   Medication Administration: Via alternative means    Other  Recommendations Oral Care Recommendations: Oral care QID Other Recommendations: Remove water pitcher;Have oral suction available;Prohibited food (jello, ice cream, thin soups)   Follow up Recommendations Skilled Nursing facility(Hospice)      Frequency and Duration min 2x/week  2 weeks       Prognosis Prognosis for Safe Diet Advancement: Guarded Barriers to Reach Goals: Cognitive deficits;Severity of deficits;Time post onset(chornic gradual decline)      Swallow Study   General Date of Onset: 07/30/17 HPI: Levi Robinson a 84 y.o.malewith medical history significantfor DM, HTN, recent acute subdural hematoma (05/2017),carotid and coronary disease,CHF,paroxysmalatrial fibrillation.Patientwas brought to the ED by family with complaints of altered mental status. Over the past 5 days patient has physically and cognitively declined,not responding to questions as before,has not been able to ambulate, orfeed himself.Head CT-large and right acute on chronic subdural hematoma now measuring 2.8 cm in maximum diameter previously 2.1 cm.Resultant increased right-to-left midline shift now measuring 8 mm previously 3 mm.Neurosurgery was consulted in the ED.Granddaughters- [name] (HCPOA) and [name]-talked to [employer] on the phone.Family stilldeclined surgery. Palliativecare consulted in the ED-follow as inpatient. Type of Study: Bedside Swallow Evaluation Previous Swallow Assessment: BSE 05/2017 - recommend dysphagia 3 with thin liquids Diet Prior to this Study: NPO Temperature Spikes Noted: No Respiratory Status: Room air History of Recent Intubation: No Behavior/Cognition: Lethargic/Drowsy Oral Cavity Assessment: Within Functional Limits Oral Care Completed by SLP: No Patient Positioning: Upright in bed Baseline Vocal Quality: Not observed Volitional Cough: Cognitively unable to  elicit Volitional Swallow: Unable to elicit    Oral/Motor/Sensory Function Overall Oral Motor/Sensory Function: (unable to assess)   Ice Chips Ice chips: Not tested   Thin Liquid Thin Liquid: Not tested    Nectar Thick Nectar Thick Liquid: Not tested   Honey Thick Honey Thick Liquid: Not tested   Puree Puree: Not tested   Solid   GO   Solid: Not tested        Mayukha Symmonds 07/31/2017,4:23 PM

## 2017-07-31 NOTE — Progress Notes (Addendum)
Initial Nutrition Assessment  DOCUMENTATION CODES:   Not applicable  INTERVENTION:    PO diet vs EN support intiation  Recommend Speech Path consult for swallow evaluation prior to PO diet advancement   NUTRITION DIAGNOSIS:   Inadequate oral intake related to inability to eat as evidenced by NPO status  GOAL:   Patient will meet greater than or equal to 90% of their needs  MONITOR:   Diet advancement, PO intake, Labs, Skin, Weight trends, I & O's  REASON FOR ASSESSMENT:   Malnutrition Screening Tool  ASSESSMENT:   [name] with PMH of DM, HTN, recent acute subdural hematoma, carotid and coronary disease, CHF, paroxysmal atrial fibrillation; family brought pt in ED for altered mental status.    Patient admitted for Subdural hematoma (HCC) [S06.5X9A] Elevated troponin [R74.8] Palliative care patient [Z51.5]  RD spoke with pt's son-in-law who was on the phone with pt's daughter. Daughter reports pt typically eats well, however, needs assistance with meals. Pt has drank some Ensure supplements at some point PTA. None in the last 2 weeks.  Pt's son-in-law reveals that "they" just found out pt has lost about 20 lbs since 05/2017. Daughter shares pt has no issues swallowing and/or chewing. Labs and medications reviewed. CBG's [phone].  Palliative Medicine Team consulted for goals of care. Suspect malnutrition, however, unable to identify at this time.  NUTRITION - FOCUSED PHYSICAL EXAM:  Deferred  Diet Order:   Diet Order           Diet NPO time specified  Diet effective now         EDUCATION NEEDS:   Not appropriate for education at this time  Skin:  Skin Assessment: Reviewed RN Assessment  Last BM:  PTA  Height:   Ht Readings from Last 1 Encounters:  07/30/17 5\' 10"  (1.778 m)   Weight:   Wt Readings from Last 1 Encounters:  07/30/17 139 lb 5.3 oz (63.2 kg)   Wt Readings from Last 10 Encounters:  07/30/17 139 lb 5.3 oz (63.2 kg)   06/30/17 160 lb (72.6 kg)  06/05/17 160 lb (72.6 kg)  03/26/17 156 lb 3.2 oz (70.9 kg)  02/12/17 157 lb (71.2 kg)  12/19/16 180 lb (81.6 kg)  03/27/16 171 lb 6.4 oz (77.7 kg)  06/22/14 167 lb 6.4 oz (75.9 kg)  11/21/13 164 lb (74.4 kg)  05/24/13 181 lb (82.1 kg)   BMI:  Body mass index is 19.99 kg/m.  Estimated Nutritional Needs:   Kcal:  1300-1500  Protein:  65-80 gm  Fluid:  >/= 1.5 L  Maureen ChattersKatie Cyncere Sontag, RD, LDN Pager #: [phone] After-Hours Pager #: [phone]

## 2017-07-31 NOTE — Consult Note (Signed)
Consultation Note Date: 07/31/2017   Patient Name: Levi Robinson  DOB: [birth year]  MRN: [id number]  Age / Sex: 84 y.o., male  PCP: Georgianne Fick, MD Referring Physician: Alba Cory, MD  Reason for Consultation: Establishing goals of care and Psychosocial/spiritual support  HPI/Patient Profile: 84 y.o. male, "BB", with past medical history of fall and SDH in late May 2019, paroxysmal afib/aflutter, CAD, and brittle diabetes who was admitted on 07/30/2017 with altered mental status.  ER work up revealed enlargement of the previous SDH including an 8 mm midline shift. (in May midline shift was 3 mm) as well as a UTI.  Family has declined any surgical procedures.  Clinical Assessment and Goals of Care:  I have reviewed medical records including EPIC notes, labs and imaging, received report from the care team, assessed the patient and then spoke on the phone with HCPOA, grand daughter Levi Emery  to discuss diagnosis prognosis, GOC, EOL wishes, disposition and options.  I introduced Palliative Medicine as specialized medical care for people living with serious illness. It focuses on providing relief from the symptoms and stress of a serious illness. The goal is to improve quality of life for both the patient and the family.  We discussed a brief life review of the patient.  "BB" was a [employer] Cyt[employer].  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  His wife passed 5 years ago. His children have also passed away.  He has 16 grand and great grand children.    As far as functional and nutritional status after his hospitalization in late May he was living at home with care from his grand children.  He was able to ambulate a bit on his RW and was eating fairly well.  In the 5 days prior to admission he stopped ambulating and was unable to feed himself.  He would  eat small amounts if fed.  According to [facility] he ate crackers and water in the ER yesterday.  We discussed his current illness and what it means in the larger context of his life.  Natural disease trajectory and expectations at EOL were discussed.  Specifically, [name] expressed concern that his altered mental status may be partially due to the UTI.  I explained the enlargement of the SDH and the midline shift.  I explained that if his AMS is being caused by the midline shift then it is not reversible and we are nearing end of life.  We agreed to re-assess again on 7/20 as the UTI will essentially be treated by then.    I explained and Levi Robinson understood that if his mental status is not improved on 7/20 he is nearing end of life and Hospice House is recommended.   Levi Robinson stated she is unable to care for him at home if he can not walk.  We discussed Hospice House and the possibility of Hospice at the [facility] or [facility] [place] facilities.  If BB is improved  tomorrow then we will move forward with evaluation.  Levi Robinson would be hopeful for SNF rehab.  Levi Robinson chooses to pray for the best but be prepared for the worst.  I 100% support her in doing just that.  I attempted to elicit values and goals of care important to the patient.  The patient has long expressed that when its his time to go - let him go, do not keep him alive.  DNR / DNI.   Primary Decision Maker:  NEXT OF KIN Levi Robinson, Grand daughter and HCPOA.    SUMMARY OF RECOMMENDATIONS    Observe under full scope treatment for another 24 hours.    If improved move forward with therapies. If not improved move forward with evaluating [facility] [place] [place] [facility] [place] As [place] stated he was eating when fed in the ER yesterday, I will request a bedside evaluation from SLP for diet recommendations (vs NPO).  Code Status/Advance Care Planning:  DNIR/DNI   Additional Recommendations (Limitations, Scope, Preferences):  No Surgical  Procedures  Palliative Prophylaxis:   Aspiration and Delirium Protocol  Psycho-social/Spiritual:   Desire for further Chaplaincy support: Christian. Yes if available.  Prognosis:  If patient remains in current state he has days to less that two weeks.  He is not currently taking any PO.  I'm concerned for further expansion of the SDH and greater midline shift. He has the potential to have severe pain / symptoms.    Discharge Planning: To Be Determined  If no improvement Hospice House is recommended.  As a [employer] Vet [facility].        Primary Diagnoses: Present on Admission: . Altered mental status . CAD (coronary artery disease) . Carotid arterial disease (HCC) . Atrial fibrillation with RVR (HCC) . DNR (do not resuscitate) . Subdural hematoma, acute (HCC) . Uncontrolled diabetes mellitus (HCC) . Benign essential HTN . Nonproliferative diabetic retinopathy (HCC)   I have reviewed the medical record, interviewed the patient and family, and examined the patient. The following aspects are pertinent.  Past Medical History:  Diagnosis Date  . Anemia    a. Felt to be iatrogenic from procedures/sticks 06/2011  . Arthritis   . CAD (coronary artery disease)    a. NSTEMI s/p complex PCI to prox Cx 07/03/11  . Carotid arterial disease (HCC)    a. By MRI 05/2011;  b. 06/2011 Carotid U/S: LICA 80-99%, RICA 100%, patent vertebrals.  . Diabetes mellitus   . Encephalopathy    AMS during 06/2011 hospitalization - MRI of brain without infarct but did show R carotid dz, abnl EEG with nonspecific encephalopathy, felt secondary to drugs  . Hypertension   . Myocardial infarction (HCC)   . Transient atrial fibrillation or flutter    Brief WCT during hospitalization 06/2011 felt to be afib with abberation, not a good coumadin candidate   Social History   Socioeconomic History  . Marital status: Widowed    Spouse name: Not on file  .  Number of children: Not on file  . Years of education: Not on file  . Highest education level: Not on file  Occupational History  . Not on file  Social Needs  . Financial resource strain: Not on file  . Food insecurity:    Worry: Not on file    Inability: Not on file  . Transportation needs:    Medical: Not on file    Non-medical:  Not on file  Tobacco Use  . Smoking status: Former Smoker    Types: Cigarettes    Last attempt to quit: 01/13/1986    Years since quitting: 31.5  . Smokeless tobacco: Never Used  Substance and Sexual Activity  . Alcohol use: No  . Drug use: No  . Sexual activity: Not on file  Lifestyle  . Physical activity:    Days per week: Not on file    Minutes per session: Not on file  . Stress: Not on file  Relationships  . Social connections:    Talks on phone: Not on file    Gets together: Not on file    Attends religious service: Not on file    Active member of club or organization: Not on file    Attends meetings of clubs or organizations: Not on file    Relationship status: Not on file  Other Topics Concern  . Not on file  Social History Narrative  . Not on file   Family History  Problem Relation Age of Onset  . Diabetes Mother   . Cancer Father   . Cancer Sister   . Cancer Brother   . Cancer Daughter    Scheduled Meds: . insulin detemir  15 Units Subcutaneous Daily  . mouth rinse  15 mL Mouth Rinse BID   Continuous Infusions: . cefTRIAXone (ROCEPHIN)  IV Stopped (07/31/17 0229)  . levETIRAcetam Stopped (07/31/17 1147)   PRN Meds:.acetaminophen **OR** acetaminophen, ondansetron **OR** ondansetron (ZOFRAN) IV, polyethylene glycol Allergies  Allergen Reactions  . Ativan [Lorazepam] Other (See Comments)    agitation  . Fentanyl Nausea And Vomiting   Review of Systems patient non-verbal.  Physical Exam  Elderly male, eyes remain closed, non-verbal. Turns his head towards my voice when I first call his name. He will squeeze my hands,  but otherwise he follows no commands Appears comfortable. RRR Resp no distress Abdomen soft, non distended.  Vital Signs: BP (!) 113/55 (BP Location: Left Arm)   Pulse (!) 57   Temp 98.1 F (36.7 C)   Resp 13   Ht 5\' 10"  (1.778 m)   Wt 63.2 kg (139 lb 5.3 oz)   SpO2 97%   BMI 19.99 kg/m  Pain Scale: PAINAD   Pain Score: 0-No pain   SpO2: SpO2: 97 % O2 Device:SpO2: 97 % O2 Flow Rate: .   IO: Intake/output summary:   Intake/Output Summary (Last 24 hours) at 07/31/2017 1343 Last data filed at 07/31/2017 1300 Gross per 24 hour  Intake 1029.19 ml  Output 800 ml  Net 229.19 ml    LBM: Last BM Date: (UTA pt did not answer question due to confusion) Baseline Weight: Weight: 72.6 kg (160 lb) Most recent weight: Weight: 63.2 kg (139 lb 5.3 oz)     Palliative Assessment/Data: 10 - 20%     Time In: 1:00 Time Out: 2:00 Time Total: 60 min. Greater than 50%  of this time was spent counseling and coordinating care related to the above assessment and plan.  Signed by: Norvel Richards, PA-C Palliative Medicine Pager: [phone]  Please contact Palliative Medicine Team phone at [phone] for questions and concerns.  For individual provider: See Loretha Stapler

## 2017-07-31 NOTE — Progress Notes (Addendum)
PROGRESS NOTE    Burr MedicoBernice L Wenger  ZOX:[id number] DOB: 07/18/1921 DOA: 07/30/2017 PCP: Georgianne Fickamachandran, Ajith, MD    Brief Narrative: Burr MedicoBernice L Forgione is a 84 y.o. male with medical history significant for DM,  HTN, recent acute subdural hematoma, carotid and coronary  disease, CHF, paroxysmal atrial fibrillation.  Patient is asleep, arouses to voice, but appears lethargic .  History is obtained from family- 2 granddaughters who are at bedside. Patient was brought to the ED by family with complaints of altered mental status.  Over the past 5 days patient has physically and cognitively declined, not responding to questions as before, has not been able to ambulate, or feed himself.    Recent hospital admission 5/24- 06/09/17 after a fall at home with CT head showing significant right subdural hematoma with right greater than left shift.  And initially obtunded, improved.  Family declined any invasive procedures.  Patient was discharged to home.  Since discharge patient's granddaughter, who patient resides with, reports gradual decline in mental status that would improve, but report he has not being this weak for this long.  Patient was previously able to use his walker, not ambulates with wheelchair.  Family reports reduced appetite.  No nausea vomiting or loose stools.  ED Course: Mild intermittent tachypnea to 23, systolic blood pressure 93 improved to 140s.  WBC 6.7.  Troponin mildly elevated 0.1.  Creatinine 1.4 improved compared to baseline.  EKG sinus rhythm.  UA-moderate leukocytes, many bacteria.  Head CT-large and right acute on chronic subdural hematoma now measuring 2.8 cm in maximum diameter previously 2.1 cm.  Resultant increased right-to-left midline shift now measuring 8 mm previously 3 mm.  Neurosurgery was consulted in the ED. Granddaughters- [name] (HCPOA) and [name]-talked to Dr. Franky Machoabbell on the phone.  Family still declined surgery. Palliative care consulted in the ED-follow as inpatient.     Assessment & Plan:   Principal Problem:   Altered mental status Active Problems:   CAD (coronary artery disease)   Carotid arterial disease (HCC)   Nonproliferative diabetic retinopathy (HCC)   Atrial fibrillation with RVR (HCC)   Subdural hematoma, acute (HCC)   Uncontrolled diabetes mellitus (HCC)   DNR (do not resuscitate)   Benign essential HTN   Palliative care patient  1-Subdural Hematoma;  Family discussed with Dr Franky Machoabbell, they have decline surgery.  Patient is lethargic, likely related to subdural.  Start keppra. Will give one time of decadron.  Family to meet with palliative for goal of care and disposition.   Elevation of troponin; Likely related to intracranial bleed.  No further evaluation.   DM; last hemoglobin A1c 9.4 05/2017. Hold levemir. Poor oral intake.   Paroxysmal atrial fibrillation, diastolic CHF, CAD hx-last echo 2013, EF 65- 70%.  Appears stable. Hold diuretics.   UTI;  Continue with Ceftriaxone IV.      DVT prophylaxis: none, comfort.  Code Status: DNR Family Communication: son in law at bedside.  Disposition Plan: to be determine, might need to go residential hospice facility   Consultants:   Neurosurgery  palliative   Procedures:    Antimicrobials:   none   Subjective: Lethargic, barely responsive.   Objective: Vitals:   07/30/17 2332 07/31/17 0403 07/31/17 0733 07/31/17 1216  BP: (!) 147/52 (!) 136/54 (!) 143/61 (!) 113/55  Pulse: (!) 53 64 65 (!) 57  Resp: 20 20 16 13   Temp: 98 F (36.7 C) 97.6 F (36.4 C) 98.4 F (36.9 C) 98.1 F (36.7 C)  TempSrc: Oral  Oral Oral   SpO2: 100% 99% 97% 97%  Weight:      Height:        Intake/Output Summary (Last 24 hours) at 07/31/2017 1424 Last data filed at 07/31/2017 1300 Gross per 24 hour  Intake 1029.19 ml  Output 800 ml  Net 229.19 ml   Filed Weights   07/30/17 1447 07/30/17 2100  Weight: 72.6 kg (160 lb) 63.2 kg (139 lb 5.3 oz)    Examination:  General  exam: Appears calm and comfortable  Respiratory system: Clear to auscultation. Respiratory effort normal. Cardiovascular system: S1 & S2 heard, RRR. No JVD, murmurs, rubs, gallops or clicks. No pedal edema. Gastrointestinal system: Abdomen is nondistended, soft and nontender. No organomegaly or masses felt. Normal bowel sounds heard. Central nervous system: letahrgic Extremities: Symmetric 5 x 5 power. Skin: No rashes, lesions or ulcers    Data Reviewed: I have personally reviewed following labs and imaging studies  CBC: Recent Labs  Lab 07/30/17 1612  WBC 6.7  NEUTROABS 4.4  HGB 14.4  HCT 41.9  MCV 92.3  PLT 165   Basic Metabolic Panel: Recent Labs  Lab 07/30/17 1612  NA 134*  K 4.3  CL 100  CO2 25  GLUCOSE 271*  BUN 22  CREATININE 1.40*  CALCIUM 8.8*   GFR: Estimated Creatinine Clearance: 29.5 mL/min (A) (by C-G formula based on SCr of 1.4 mg/dL (H)). Liver Function Tests: Recent Labs  Lab 07/30/17 1612  AST 21  ALT 12  ALKPHOS 66  BILITOT 0.8  PROT 7.9  ALBUMIN 3.5   No results for input(s): LIPASE, AMYLASE in the last 168 hours. Recent Labs  Lab 07/30/17 1612  AMMONIA 18   Coagulation Profile: No results for input(s): INR, PROTIME in the last 168 hours. Cardiac Enzymes: Recent Labs  Lab 07/30/17 1612 07/31/17 0027 07/31/17 0627  TROPONINI 0.10* 0.11* 0.11*   BNP (last 3 results) No results for input(s): PROBNP in the last 8760 hours. HbA1C: No results for input(s): HGBA1C in the last 72 hours. CBG: Recent Labs  Lab 07/30/17 2206 07/30/17 2328 07/31/17 0359 07/31/17 0730 07/31/17 1115  GLUCAP 188* 190* 143* 148* 127*   Lipid Profile: No results for input(s): CHOL, HDL, LDLCALC, TRIG, CHOLHDL, LDLDIRECT in the last 72 hours. Thyroid Function Tests: No results for input(s): TSH, T4TOTAL, FREET4, T3FREE, THYROIDAB in the last 72 hours. Anemia Panel: No results for input(s): VITAMINB12, FOLATE, FERRITIN, TIBC, IRON, RETICCTPCT in the  last 72 hours. Sepsis Labs: Recent Labs  Lab 07/30/17 1622  LATICACIDVEN 1.59    No results found for this or any previous visit (from the past 240 hour(s)).       Radiology Studies: Dg Chest 2 View  Result Date: 07/30/2017 CLINICAL DATA:  Altered mental status for the past 4 days. EXAM: CHEST - 2 VIEW COMPARISON:  Chest x-ray dated Jun 05, 2017. FINDINGS: Stable cardiac silhouette, at the upper limits of normal in size. Normal pulmonary vascularity. Stable left greater than right bibasilar atelectasis/scarring. No focal consolidation, pleural effusion, or pneumothorax. No acute osseous abnormality. IMPRESSION: Bibasilar atelectasis/scarring, unchanged. No active cardiopulmonary disease. Electronically Signed   By: Obie Dredge M.D.   On: 07/30/2017 16:02   Ct Head Wo Contrast  Result Date: 07/30/2017 CLINICAL DATA:  Altered mental status. EXAM: CT HEAD WITHOUT CONTRAST TECHNIQUE: Contiguous axial images were obtained from the base of the skull through the vertex without intravenous contrast. COMPARISON:  CT head dated June 15, 2017. FINDINGS: Brain: Enlarging, predominantly isodense  right subdural hematoma, now measuring up to 2.8 cm in maximal diameter, previously 2.1 cm. There are areas of layering hyperdensity posteriorly, consistent with recent hemorrhage. Increased right to left midline shift, now measuring 8 mm, previously 3 mm. There is near complete effacement of the right lateral ventricle with minimally increased entrapment of the left lateral ventricle. Worsening right cerebral hemisphere sulcal effacement. Basal cisterns remain patent. Resolved left subdural hygroma. No evidence of acute infarction. Stable chronic microvascular ischemic changes. Vascular: Atherosclerotic vascular calcification of the carotid siphons. No hyperdense vessel. Skull: Normal. Negative for fracture or focal lesion. Sinuses/Orbits: No acute finding. Other: None. IMPRESSION: 1. Enlarging right acute on  chronic subdural hematoma, now measuring 2.8 cm in maximal diameter, previously 2.1 cm. Resultant increased right-to-left midline shift, now measuring 8 mm, previously 3 mm. 2. Increased right cerebral hemisphere sulcal effacement with near complete effacement of the right lateral ventricle and minimally increased entrapment of the left lateral ventricle. 3. Resolved left subdural hygroma. Critical Value/emergent results were called by telephone at the time of interpretation on 07/30/2017 at 3:51 pm to Dr. Arthor Captain , who verbally acknowledged these results. Electronically Signed   By: Obie Dredge M.D.   On: 07/30/2017 15:53        Scheduled Meds: . insulin detemir  15 Units Subcutaneous Daily  . mouth rinse  15 mL Mouth Rinse BID   Continuous Infusions: . cefTRIAXone (ROCEPHIN)  IV Stopped (07/31/17 0229)  . levETIRAcetam Stopped (07/31/17 1147)     LOS: 1 day    Time spent: 35 minutes.     Alba Cory, MD [facility] Pager [phone]  If 7PM-7AM, please contact night-coverage [email or web address] Password [facility] 07/31/2017, 2:24 PM

## 2017-07-31 NOTE — Progress Notes (Signed)
Pt granddaughter called to inform RN she's aware they suppose to meet with palliative but someone will be here between 4 and 5pm. P. Seleta RhymesAmo Nishawn Rotan RN

## 2017-07-31 NOTE — Progress Notes (Addendum)
Inpatient Diabetes Program Recommendations  AACE/ADA: New Consensus Statement on Inpatient Glycemic Control (2015)  Target Ranges:  Prepandial:   less than 140 mg/dL      Peak postprandial:   less than 180 mg/dL (1-2 hours)      Critically ill patients:  140 - 180 mg/dL   Results for [name], Levi L (MRN [id number]) as of 07/31/2017 10:27  Ref. Range 07/30/2017 15:02 07/30/2017 22:06  Glucose-Capillary Latest Ref Range: 70 - 99 mg/dL 578286 (H) 469188 (H)   Results for [name], Levi L (MRN [id number]) as of 07/31/2017 10:27  Ref. Range 07/30/2017 23:28 07/31/2017 03:59 07/31/2017 07:30  Glucose-Capillary Latest Ref Range: 70 - 99 mg/dL 244190 (H)  15 units LEVEMIR at 2:27am 143 (H) 148 (H)  15 units LEVEMIR at 10am    Admit with: AMS  History: DM, CHF  Home DM Meds: Levemir 48 units daily       Levemir 16-20 units at dinner if needed  Current Insulin Orders: Levemir 15 units daily       MD- Note granddaughter requests we do not use Novolog SSI as she reports patient is very sensitive and blood sugars can drop quickly      --Will follow patient during hospitalization--  Ambrose FinlandJeannine Johnston Adin Laker RN, MSN, CDE Diabetes Coordinator Inpatient Glycemic Control Team Team Pager: [phone] (8a-5p)

## 2017-08-01 DIAGNOSIS — E0865 Diabetes mellitus due to underlying condition with hyperglycemia: Secondary | ICD-10-CM

## 2017-08-01 DIAGNOSIS — E113299 Type 2 diabetes mellitus with mild nonproliferative diabetic retinopathy without macular edema, unspecified eye: Secondary | ICD-10-CM

## 2017-08-01 DIAGNOSIS — Z66 Do not resuscitate: Secondary | ICD-10-CM

## 2017-08-01 DIAGNOSIS — Z7189 Other specified counseling: Secondary | ICD-10-CM

## 2017-08-01 LAB — GLUCOSE, CAPILLARY
GLUCOSE-CAPILLARY: 174 mg/dL — AB (ref 70–99)
GLUCOSE-CAPILLARY: 259 mg/dL — AB (ref 70–99)
GLUCOSE-CAPILLARY: 280 mg/dL — AB (ref 70–99)
Glucose-Capillary: 154 mg/dL — ABNORMAL HIGH (ref 70–99)
Glucose-Capillary: 159 mg/dL — ABNORMAL HIGH (ref 70–99)
Glucose-Capillary: 161 mg/dL — ABNORMAL HIGH (ref 70–99)

## 2017-08-01 MED ORDER — SODIUM CHLORIDE 0.9 % IV SOLN
INTRAVENOUS | Status: DC
  Administered 2017-08-01 – 2017-08-07 (×7): via INTRAVENOUS

## 2017-08-01 MED ORDER — VANCOMYCIN HCL 500 MG IV SOLR
500.0000 mg | INTRAVENOUS | Status: DC
  Filled 2017-08-01: qty 500

## 2017-08-01 MED ORDER — VANCOMYCIN HCL IN DEXTROSE 1-5 GM/200ML-% IV SOLN
1000.0000 mg | Freq: Once | INTRAVENOUS | Status: AC
  Administered 2017-08-01: 1000 mg via INTRAVENOUS
  Filled 2017-08-01: qty 200

## 2017-08-01 MED ORDER — DEXAMETHASONE SODIUM PHOSPHATE 10 MG/ML IJ SOLN: 5.0000 mg | Freq: Two times a day (BID) | INTRAMUSCULAR | Status: DC

## 2017-08-01 MED ORDER — DEXAMETHASONE SODIUM PHOSPHATE 10 MG/ML IJ SOLN
5.0000 mg | INTRAMUSCULAR | Status: DC
  Administered 2017-08-01: 5 mg via INTRAVENOUS
  Filled 2017-08-01: qty 1

## 2017-08-01 NOTE — Progress Notes (Signed)
Pharmacy Antibiotic Note  Levi Robinson is a 84 y.o. male admitted on 07/30/2017 with AMS.  Pharmacy has been consulted for vancomycin dosing for Enterococcus UTI. Previously on ceftriaxone. SCr 1.4 and appears at baseline.  Plan: Vancomycin 1000 mg IV load then 500 mg IV q24h Goal trough 10-15 mcg/ml Monitor renal function, clinical progress, C/S If sensitive to ampicillin - consider narrowing antibiotics  Height: 5\' 10"  (177.8 cm) Weight: 139 lb 5.3 oz (63.2 kg) IBW/kg (Calculated) : 73  Temp (24hrs), Avg:98.2 F (36.8 C), Min:97.7 F (36.5 C), Max:98.4 F (36.9 C)  Recent Labs  Lab 07/30/17 1612 07/30/17 1622  WBC 6.7  --   CREATININE 1.40*  --   LATICACIDVEN  --  1.59    Estimated Creatinine Clearance: 29.5 mL/min (A) (by C-G formula based on SCr of 1.4 mg/dL (H)).    Allergies  Allergen Reactions  . Ativan [Lorazepam] Other (See Comments)    agitation  . Fentanyl Nausea And Vomiting    Antimicrobials this admission: vancomycin 7/20 >>  ceftriaxone 7/19 >> 7/20  Dose adjustments this admission:   Microbiology results: 7/18 UCx: >100 K Enterococcus   Thank you for allowing pharmacy to be a part of this patient's care.  Loura BackJennifer Harrah, PharmD, BCPS Clinical Pharmacist Clinical phone for 08/01/2017 until 3p is [phone] Please check AMION for all Pharmacist numbers by unit 08/01/2017 2:35 PM

## 2017-08-01 NOTE — Progress Notes (Addendum)
PROGRESS NOTE    Levi Robinson  ZOX:[id number] DOB: 1922/01/05 DOA: 07/30/2017 PCP: Georgianne Fick, MD    Brief Narrative: Levi Robinson is a 84 y.o. male with medical history significant for DM,  HTN, recent acute subdural hematoma, carotid and coronary  disease, CHF, paroxysmal atrial fibrillation.  Patient is asleep, arouses to voice, but appears lethargic .  History is obtained from family- 2 granddaughters who are at bedside. Patient was brought to the ED by family with complaints of altered mental status.  Over the past 5 days patient has physically and cognitively declined, not responding to questions as before, has not been able to ambulate, or feed himself.    Recent hospital admission 5/24- 06/09/17 after a fall at home with CT head showing significant right subdural hematoma with right greater than left shift.  And initially obtunded, improved.  Family declined any invasive procedures.  Patient was discharged to home.  Since discharge patient's granddaughter, who patient resides with, reports gradual decline in mental status that would improve, but report he has not being this weak for this long.  Patient was previously able to use his walker, not ambulates with wheelchair.  Family reports reduced appetite.  No nausea vomiting or loose stools.  ED Course: Mild intermittent tachypnea to 23, systolic blood pressure 93 improved to 140s.  WBC 6.7.  Troponin mildly elevated 0.1.  Creatinine 1.4 improved compared to baseline.  EKG sinus rhythm.  UA-moderate leukocytes, many bacteria.  Head CT-large and right acute on chronic subdural hematoma now measuring 2.8 cm in maximum diameter previously 2.1 cm.  Resultant increased right-to-left midline shift now measuring 8 mm previously 3 mm.  Neurosurgery was consulted in the ED. Granddaughters- [name] (HCPOA) and [name]-talked to Dr. Franky Macho on the phone.  Family still declined surgery. Palliative care consulted in the ED-follow as inpatient.     Assessment & Plan:   Principal Problem:   Altered mental status Active Problems:   CAD (coronary artery disease)   Carotid arterial disease (HCC)   Nonproliferative diabetic retinopathy (HCC)   Atrial fibrillation with RVR (HCC)   Subdural hematoma, acute (HCC)   Uncontrolled diabetes mellitus (HCC)   DNR (do not resuscitate)   Benign essential HTN   Palliative care patient  1-Subdural Hematoma; Acute encephalopathy , AMS Family discussed with Dr Franky Macho, they have decline surgery.  Patient is lethargic, likely related to subdural.  Continue with  keppra.   Open eyes, moves right arm at times. Mumble few words.  Family to meet with palliative for decision regarding residential hospice.   Elevation of troponin; Likely related to intracranial bleed.  No further evaluation.   DM; last hemoglobin A1c 9.4 05/2017. Hold levemir. Poor oral intake.   Paroxysmal atrial fibrillation, diastolic CHF, CAD hx-last echo 2013, EF 65- 70%.  Appears stable. Hold diuretics.   UTI;  On Ceftriaxone IV. Change to vancomycin to cover for enterococcus       DVT prophylaxis: none, comfort.  Code Status: DNR Family Communication: son in law at bedside.  Disposition Plan: to be determine, might need to go residential hospice facility   Consultants:   Neurosurgery  palliative   Procedures:    Antimicrobials:   none   Subjective: Lethargic, open eyes, mumble.   Objective: Vitals:   07/31/17 2330 08/01/17 0412 08/01/17 0802 08/01/17 1209  BP: (!) 108/55 (!) 136/58 (!) 113/49 (!) 122/51  Pulse: 71 87 67 65  Resp: 18 18 18 13   Temp: 98.2 F (36.8  C) 98.4 F (36.9 C) 97.7 F (36.5 C) 98.3 F (36.8 C)  TempSrc:   Oral Oral  SpO2: 99% 99% 98% 99%  Weight:      Height:        Intake/Output Summary (Last 24 hours) at 08/01/2017 1420 Last data filed at 08/01/2017 0400 Gross per 24 hour  Intake 380.15 ml  Output 450 ml  Net -69.85 ml   Filed Weights   07/30/17 1447  07/30/17 2100  Weight: 72.6 kg (160 lb) 63.2 kg (139 lb 5.3 oz)    Examination:  General exam: NAD Respiratory system: CTA Cardiovascular system: S 1, S 2 RRR Gastrointestinal system: BS present, soft, nt Central nervous system: Lethargic.  Extremities: no edema     Data Reviewed: I have personally reviewed following labs and imaging studies  CBC: Recent Labs  Lab 07/30/17 1612  WBC 6.7  NEUTROABS 4.4  HGB 14.4  HCT 41.9  MCV 92.3  PLT 165   Basic Metabolic Panel: Recent Labs  Lab 07/30/17 1612  NA 134*  K 4.3  CL 100  CO2 25  GLUCOSE 271*  BUN 22  CREATININE 1.40*  CALCIUM 8.8*   GFR: Estimated Creatinine Clearance: 29.5 mL/min (A) (by C-G formula based on SCr of 1.4 mg/dL (H)). Liver Function Tests: Recent Labs  Lab 07/30/17 1612  AST 21  ALT 12  ALKPHOS 66  BILITOT 0.8  PROT 7.9  ALBUMIN 3.5   No results for input(s): LIPASE, AMYLASE in the last 168 hours. Recent Labs  Lab 07/30/17 1612  AMMONIA 18   Coagulation Profile: No results for input(s): INR, PROTIME in the last 168 hours. Cardiac Enzymes: Recent Labs  Lab 07/30/17 1612 07/31/17 0027 07/31/17 0627  TROPONINI 0.10* 0.11* 0.11*   BNP (last 3 results) No results for input(s): PROBNP in the last 8760 hours. HbA1C: No results for input(s): HGBA1C in the last 72 hours. CBG: Recent Labs  Lab 07/31/17 2055 07/31/17 2347 08/01/17 0414 08/01/17 0800 08/01/17 1208  GLUCAP 91 128* 154* 159* 161*   Lipid Profile: No results for input(s): CHOL, HDL, LDLCALC, TRIG, CHOLHDL, LDLDIRECT in the last 72 hours. Thyroid Function Tests: No results for input(s): TSH, T4TOTAL, FREET4, T3FREE, THYROIDAB in the last 72 hours. Anemia Panel: No results for input(s): VITAMINB12, FOLATE, FERRITIN, TIBC, IRON, RETICCTPCT in the last 72 hours. Sepsis Labs: Recent Labs  Lab 07/30/17 1622  LATICACIDVEN 1.59    Recent Results (from the past 240 hour(s))  Culture, Urine     Status: Abnormal  (Preliminary result)   Collection Time: 07/30/17 10:47 PM  Result Value Ref Range Status   Specimen Description URINE, CLEAN CATCH  Final   Special Requests NONE  Final   Culture (A)  Final    >=100,000 COLONIES/mL ENTEROCOCCUS FAECALIS SUSCEPTIBILITIES TO FOLLOW Performed at [facility], [address], [place], [place] [address]    Report Status PENDING  Incomplete         Radiology Studies: Dg Chest 2 View  Result Date: 07/30/2017 CLINICAL DATA:  Altered mental status for the past 4 days. EXAM: CHEST - 2 VIEW COMPARISON:  Chest x-ray dated Jun 05, 2017. FINDINGS: Stable cardiac silhouette, at the upper limits of normal in size. Normal pulmonary vascularity. Stable left greater than right bibasilar atelectasis/scarring. No focal consolidation, pleural effusion, or pneumothorax. No acute osseous abnormality. IMPRESSION: Bibasilar atelectasis/scarring, unchanged. No active cardiopulmonary disease. Electronically Signed   By: Obie Dredge M.D.   On: 07/30/2017 16:02  Ct Head Wo Contrast  Result Date: 07/30/2017 CLINICAL DATA:  Altered mental status. EXAM: CT HEAD WITHOUT CONTRAST TECHNIQUE: Contiguous axial images were obtained from the base of the skull through the vertex without intravenous contrast. COMPARISON:  CT head dated June 15, 2017. FINDINGS: Brain: Enlarging, predominantly isodense right subdural hematoma, now measuring up to 2.8 cm in maximal diameter, previously 2.1 cm. There are areas of layering hyperdensity posteriorly, consistent with recent hemorrhage. Increased right to left midline shift, now measuring 8 mm, previously 3 mm. There is near complete effacement of the right lateral ventricle with minimally increased entrapment of the left lateral ventricle. Worsening right cerebral hemisphere sulcal effacement. Basal cisterns remain patent. Resolved left subdural hygroma. No evidence of acute infarction. Stable chronic microvascular ischemic changes. Vascular:  Atherosclerotic vascular calcification of the carotid siphons. No hyperdense vessel. Skull: Normal. Negative for fracture or focal lesion. Sinuses/Orbits: No acute finding. Other: None. IMPRESSION: 1. Enlarging right acute on chronic subdural hematoma, now measuring 2.8 cm in maximal diameter, previously 2.1 cm. Resultant increased right-to-left midline shift, now measuring 8 mm, previously 3 mm. 2. Increased right cerebral hemisphere sulcal effacement with near complete effacement of the right lateral ventricle and minimally increased entrapment of the left lateral ventricle. 3. Resolved left subdural hygroma. Critical Value/emergent results were called by telephone at the time of interpretation on 07/30/2017 at 3:51 pm to Dr. Arthor CaptainABIGAIL HARRIS , who verbally acknowledged these results. Electronically Signed   By: Obie DredgeWilliam T Derry M.D.   On: 07/30/2017 15:53        Scheduled Meds: . mouth rinse  15 mL Mouth Rinse BID   Continuous Infusions: . sodium chloride 75 mL/hr at 08/01/17 1217  . cefTRIAXone (ROCEPHIN)  IV Stopped (08/01/17 0112)  . levETIRAcetam 500 mg (08/01/17 1015)     LOS: 2 days    Time spent: 35 minutes.     Alba CoryBelkys A Yoko Mcgahee, MD [facility] Pager [phone]  If 7PM-7AM, please contact night-coverage [email or web address] Password [email or web address] 08/01/2017, 2:20 PM

## 2017-08-01 NOTE — Progress Notes (Signed)
Daily Progress Note   Patient Name: Levi Robinson       Date: 08/01/2017 DOB: 1921/04/07  Age: 84 y.o. MRN#: [id number] Attending Physician: Alba Cory, MD Primary Care Physician: Georgianne Fick, MD Admit Date: 07/30/2017  Reason for Consultation/Follow-up: Establishing goals of care  Subjective: Patient lying in bed. He continues to be unresponsive, however he does have his eyes open. He was able to grip with his right arm. Would not follow any other commands or respond verbally. He does not appear to be in any pain or discomfort. His son-in-law is at the bedside.   I spoke with daughter/HCPOA [name]. Family continues to be somewhat hopeful for some improvement in his status given he has his eyes open more today. She reported that she had just left from visiting and patient said hello to her and denied he was in pain. She stated he also was able to identify his name and dob. Bedside RN was present. Given he has shown some signs of improvement today she would like to continue to wait to see if he he continues to improve. She and her family are now hopeful that he may began to eat at minimum and engage more with family. She continues to be hopeful for the best but prepared for the worst. Since he has shown some signs she is now questioning if they are prepared to make a decision to have him placed at a hospice home versus a SNF for long-term care with outpatient hospice. She has requested to allow them until Monday to continue to observe for further improvement before they can come to a decision. We discussed that if he was unable to safely eat or drink and did not show improvement hospice home would be ideal for their goals for him, however, outpatient hospice at a facility could also be  considered.   Chart Reviewed and report received from bedside RN.   Length of Stay: 2  Current Medications: Scheduled Meds:  . dexamethasone  5 mg Intravenous Q12H  . mouth rinse  15 mL Mouth Rinse BID    Continuous Infusions: . sodium chloride 50 mL/hr at 08/01/17 1400  . levETIRAcetam 500 mg (08/01/17 1015)  . vancomycin     Followed by  . [START ON 08/02/2017] vancomycin      PRN Meds:  acetaminophen **OR** acetaminophen, ondansetron **OR** ondansetron (ZOFRAN) IV, polyethylene glycol  Physical Exam  Constitutional: Vital signs are normal. He has a sickly appearance.  Thin and frail in appearance   Cardiovascular: Normal heart sounds and normal pulses.  Pulmonary/Chest: Effort normal. He has decreased breath sounds.  Neurological: He is unresponsive.  Opens eyes and will track movement, no verbal response, will squeeze with right hand only   Skin: Skin is warm and dry.  Psychiatric: Cognition and memory are impaired. He expresses inappropriate judgment.  Nursing note and vitals reviewed.           Vital Signs: BP (!) 125/44 (BP Location: Left Arm)   Pulse 61   Temp 98.2 F (36.8 C)   Resp 15   Ht 5\' 10"  (1.778 m)   Wt 63.2 kg (139 lb 5.3 oz)   SpO2 94%   BMI 19.99 kg/m  SpO2: SpO2: 94 % O2 Device: O2 Device: Room Air O2 Flow Rate:    Intake/output summary:   Intake/Output Summary (Last 24 hours) at 08/01/2017 1657 Last data filed at 08/01/2017 0400 Gross per 24 hour  Intake 200.07 ml  Output 450 ml  Net -249.93 ml   LBM: Last BM Date: (UTA pt did not answer question due to confusion) Baseline Weight: Weight: 72.6 kg (160 lb) Most recent weight: Weight: 63.2 kg (139 lb 5.3 oz)       Palliative Assessment/Data: PPS 10-20%     Patient Active Problem List   Diagnosis Date Noted  . Palliative care patient   . PAF (paroxysmal atrial fibrillation) (HCC)   . Benign essential HTN   . Stage 3 chronic kidney disease (HCC)   . Atrial fibrillation with RVR  (HCC) 06/05/2017  . Subdural hematoma, acute (HCC) 06/05/2017  . Altered mental status 06/05/2017  . Acute CHF (congestive heart failure) (HCC) 06/05/2017  . Fall 06/05/2017  . Uncontrolled diabetes mellitus (HCC) 06/05/2017  . DNR (do not resuscitate) 06/05/2017  . Subdural hematoma (HCC) 06/05/2017  . Occlusion and stenosis of carotid artery without mention of cerebral infarction 08/04/2011  . Hypertension   . CAD (coronary artery disease)   . Carotid arterial disease (HCC)   . Anemia   . Posterior vitreous detachment 06/05/2011  . Status post intraocular lens implant 06/05/2011  . Branch retinal vein occlusion 12/12/2010  . Nonproliferative diabetic retinopathy (HCC) 12/12/2010    Palliative Care Assessment & Plan   Patient Profile: 84 y.o. male, "BB", with past medical history of fall and SDH in late May 2019, paroxysmal afib/aflutter, CAD, and brittle diabetes who was admitted on 07/30/2017 with altered mental status.  ER work up revealed enlargement of the previous SDH including an 8 mm midline shift. (in May midline shift was 3 mm) as well as a UTI.  Family has declined any surgical procedures.  Recommendations/Plan:  DNR/DNI  Continue to treat the treatable.  Family has requested to continue to observe any further changes in mental or functional status.  Given patient has eyes open and has her somewhat responded to family on today this has given them a little bit more hope.  They are now torn between the decision of placement in a local hospice home versus long-term care facility with outpatient hospice.  Family is inquiring about a repeat urinalysis and head CT to see if there are any changes.  Palliative will continue to support patient, family, and medical team during hospitalization.  Goals of Care and Additional Recommendations:  Limitations on Scope of  Treatment: Full Scope Treatment  Code Status:    Code Status Orders  (From admission, onward)        Start      Ordered   07/30/17 2247  Do not attempt resuscitation (DNR)  Continuous    Question Answer Comment  In the event of cardiac or respiratory ARREST Do not call a "code blue"   In the event of cardiac or respiratory ARREST Do not perform Intubation, CPR, defibrillation or ACLS   In the event of cardiac or respiratory ARREST Use medication by any route, position, wound care, and other measures to relive pain and suffering. May use oxygen, suction and manual treatment of airway obstruction as needed for comfort.      07/30/17 2246    Code Status History    Date Active Date Inactive Code Status Order ID Comments User Context   06/05/2017 1630 06/10/2017 2132 DNR [id number]  Delano Metz, MD ED    Advance Directive Documentation     Most Recent Value  Type of Advance Directive  Healthcare Power of Attorney, Living will, Out of facility DNR (pink MOST or yellow form)  Pre-existing out of facility DNR order (yellow form or pink MOST form)  -  "MOST" Form in Place?  -      Prognosis: Guarded to poor as mentioned by my colleague: "If patient remains in current state he has days to less that two weeks.  He is not currently taking any PO.  I'm concerned for further expansion of the SDH and greater midline shift. He has the potential to have severe pain / symptoms."  Discharge Planning:  To Be Determined-family is continuing to assess status daily for signs of improvement.  If no improvement family is leaning more towards hospice home, however if he continues to show signs of improvement family now states they would consider long-term care/SNF with outpatient hospice.  Care plan was discussed with patient's family, Dr. Sunnie Nielsen, and bedside RN.   Thank you for allowing the Palliative Medicine Team to assist in the care of this patient.   Total Time 25 min.  Prolonged Time Billed NO       Greater than 50%  of this time was spent counseling and coordinating care related to the above assessment  and plan.  Willette Alma, NP-BC Palliative Medicine Team  Phone: [phone] Fax: [phone] Pager: [phone] Amion: Thea Alken   Please contact Palliative Medicine Team phone at [phone] for questions and concerns.

## 2017-08-01 NOTE — Progress Notes (Signed)
  Speech Language Pathology Treatment: Dysphagia  Patient Details Name: Levi Robinson MRN: [id number] DOB: 08/25/1921 Today's Date: 08/01/2017 Time: [id number] SLP Time Calculation (min) (ACUTE ONLY): 10 min  Assessment / Plan / Recommendation Clinical Impression  Treatment focused on po readiness. Patient remains lethargic, brief eye opening noted only with max verbal and tactile stimulation. SLP provided oral care with only lip pursing, occassional dry swallows in response. Oral stimulation provided to lips with ice chip without oral opening or other evidence of ability to consume pos functionally at this time. SLP will continue efforts. Palliative care f/u recommended.   HPI HPI: Levi CanalBernice L Bairdis a 84 y.o.malewith medical history significantfor DM, HTN, recent acute subdural hematoma (05/2017),carotid and coronary disease,CHF,paroxysmalatrial fibrillation.Patientwas brought to the ED by family with complaints of altered mental status. Over the past 5 days patient has physically and cognitively declined,not responding to questions as before,has not been able to ambulate, orfeed himself.Head CT-large and right acute on chronic subdural hematoma now measuring 2.8 cm in maximum diameter previously 2.1 cm.Resultant increased right-to-left midline shift now measuring 8 mm previously 3 mm.Neurosurgery was consulted in the ED.Granddaughters- Levi Robinson (HCPOA) and Levi Robinson-talked to MeadWestvacoDr.Cabbell on the phone.Family stilldeclined surgery. Palliativecare consulted in the ED-follow as inpatient.      SLP Plan  Continue with current plan of care       Recommendations  Diet recommendations: NPO Medication Administration: Via alternative means                Oral Care Recommendations: Oral care QID Follow up Recommendations: Skilled Nursing facility SLP Visit Diagnosis: Dysphagia, unspecified (R13.10) Plan: Continue with current plan of care       GO             Levi Heath MA, CCC-SLP     Levi Robinson 08/01/2017, 8:39 AM

## 2017-08-02 LAB — GLUCOSE, CAPILLARY
GLUCOSE-CAPILLARY: 234 mg/dL — AB (ref 70–99)
GLUCOSE-CAPILLARY: 261 mg/dL — AB (ref 70–99)
Glucose-Capillary: 266 mg/dL — ABNORMAL HIGH (ref 70–99)
Glucose-Capillary: 202 mg/dL — ABNORMAL HIGH (ref 70–99)
Glucose-Capillary: 201 mg/dL — ABNORMAL HIGH (ref 70–99)

## 2017-08-02 LAB — BASIC METABOLIC PANEL
ANION GAP: 10 (ref 5–15)
BUN: 33 mg/dL — ABNORMAL HIGH (ref 8–23)
CO2: 20 mmol/L — ABNORMAL LOW (ref 22–32)
Calcium: 8.6 mg/dL — ABNORMAL LOW (ref 8.9–10.3)
Chloride: 111 mmol/L (ref 98–111)
Creatinine, Ser: 1.37 mg/dL — ABNORMAL HIGH (ref 0.61–1.24)
GFR calc Af Amer: 50 mL/min — ABNORMAL LOW (ref >60)
GFR, EST NON AFRICAN AMERICAN: 43 mL/min — AB (ref >60)
Glucose, Bld: 292 mg/dL — ABNORMAL HIGH (ref 70–99)
POTASSIUM: 4.5 mmol/L (ref 3.5–5.1)
Sodium: 141 mmol/L (ref 135–145)

## 2017-08-02 LAB — URINE CULTURE: Culture: >=100000 — AB

## 2017-08-02 LAB — CBC
HEMATOCRIT: 38.5 % — AB (ref 39.0–52.0)
HEMOGLOBIN: 12.6 g/dL — AB (ref 13.0–17.0)
MCH: 31 pg (ref 26.0–34.0)
MCHC: 32.7 g/dL (ref 30.0–36.0)
MCV: 94.8 fL (ref 78.0–100.0)
Platelets: 156 10*3/uL (ref 150–400)
RBC: 4.06 MIL/uL — ABNORMAL LOW (ref 4.22–5.81)
RDW: 12.6 % (ref 11.5–15.5)
WBC: 6.6 10*3/uL (ref 4.0–10.5)

## 2017-08-02 MED ORDER — INSULIN ASPART 100 UNIT/ML ~~LOC~~ SOLN
0.0000 [IU] | Freq: Three times a day (TID) | SUBCUTANEOUS | Status: DC
  Administered 2017-08-02: 5 [IU] via SUBCUTANEOUS
  Administered 2017-08-02 – 2017-08-03 (×2): 3 [IU] via SUBCUTANEOUS
  Administered 2017-08-04 (×2): 2 [IU] via SUBCUTANEOUS
  Administered 2017-08-05 (×2): 3 [IU] via SUBCUTANEOUS
  Administered 2017-08-06: 2 [IU] via SUBCUTANEOUS
  Administered 2017-08-06 – 2017-08-07 (×4): 3 [IU] via SUBCUTANEOUS
  Administered 2017-08-07: 5 [IU] via SUBCUTANEOUS
  Administered 2017-08-08: 2 [IU] via SUBCUTANEOUS
  Administered 2017-08-08: 1 [IU] via SUBCUTANEOUS
  Administered 2017-08-08 – 2017-08-09 (×2): 2 [IU] via SUBCUTANEOUS
  Administered 2017-08-09: 1 [IU] via SUBCUTANEOUS
  Administered 2017-08-10 – 2017-08-11 (×2): 3 [IU] via SUBCUTANEOUS
  Administered 2017-08-12: 2 [IU] via SUBCUTANEOUS
  Administered 2017-08-12: 3 [IU] via SUBCUTANEOUS

## 2017-08-02 MED ORDER — SODIUM CHLORIDE 0.9 % IV SOLN
1.0000 g | Freq: Three times a day (TID) | INTRAVENOUS | Status: DC
  Administered 2017-08-02 – 2017-08-05 (×9): 1 g via INTRAVENOUS
  Filled 2017-08-02 (×11): qty 1000

## 2017-08-02 NOTE — Evaluation (Signed)
Physical Therapy Evaluation Patient Details Name: Levi Robinson MRN: [id number] DOB: Oct 08, 1921 Today's Date: 08/02/2017   History of Present Illness  Patient is a 84 y/o male initially presenting to the ED on 07/30/17 due to AMS. Of note, recent hospital admission 5/24- 06/09/17 after a fall at home with CT head showing significant right subdural hematoma with right greater than left shift. Most recent MRI revealing Enlarging right acute on chronic subdural hematoma, now measuring 2.8 cm in maximal diameter, previously 2.1 cm. Resultant increased right-to-left midline shift, now measuring 8 mm, previously 3 mm. PMH significant for DM,  HTN, recent acute subdural hematoma, carotid and coronary  disease, CHF, paroxysmal atrial fibrillation.    Clinical Impression  Levi Robinson is a very pleasant 84 y/o male admitted with the above listed diagnosis. Prior history glean from chart review as patient is a limited historian. PT evaluation limited to bed level due to known enlarging subdural hematoma, per MD/nursing request. Able to participate in conversation and intermittently answer yes/no questions with increased time. Limited intentional movement with command but able to actively lift B UE/LE against gravity for intentional self-stretching. Good tolerance to all passive motion at B hip, knee, and ankle. PT to continue to follow acutely to progress functional mobility as able.     Follow Up Recommendations SNF;Supervision/Assistance - 24 hour    Equipment Recommendations  None recommended by PT    Recommendations for Other Services       Precautions / Restrictions Precautions Precautions: Fall Restrictions Weight Bearing Restrictions: No      Mobility  Bed Mobility Overal bed mobility: Needs Assistance             General bed mobility comments: Assist for repositioning in bed; did not progress bed mobility/transfers per MD/nurse request; able to perform bed level stretching, AROM/PROM  of all extremities  Transfers                    Ambulation/Gait                Stairs            Wheelchair Mobility    Modified Rankin (Stroke Patients Only)       Balance                                             Pertinent Vitals/Pain Pain Assessment: No/denies pain    Home Living Family/patient expects to be discharged to:: Private residence Living Arrangements: Children;Other relatives Available Help at Discharge: Family;Personal care attendant;Available 24 hours/day;Other (Comment) Type of Home: House Home Access: Ramped entrance;Stairs to enter     Home Layout: One level Home Equipment: Walker - 4 wheels;Walker - 2 wheels;Wheelchair - manual;Grab bars - tub/shower;Tub bench Additional Comments: living arrangements gleaned from most recent hospital admission; patient is a limited historian    Prior Function Level of Independence: Needs assistance   Gait / Transfers Assistance Needed: per H&P ws ambualting with RW vs w/c  ADL's / Homemaking Assistance Needed: per chart was receiving aide in the home 4-5x/week        Hand Dominance        Extremity/Trunk Assessment   Upper Extremity Assessment Upper Extremity Assessment: Defer to OT evaluation    Lower Extremity Assessment Lower Extremity Assessment: Generalized weakness RLE Deficits / Details: able to assist with lifting LE  for SCD application LLE Deficits / Details: able to assist with lifting LE for SCD application       Communication   Communication: Expressive difficulties  Cognition Arousal/Alertness: Awake/alert Behavior During Therapy: Flat affect Overall Cognitive Status: No family/caregiver present to determine baseline cognitive functioning Area of Impairment: Problem solving;Following commands                       Following Commands: Follows one step commands inconsistently;Follows one step commands with increased time      Problem Solving: Slow processing;Decreased initiation;Difficulty sequencing;Requires verbal cues;Requires tactile cues General Comments: unable to further assess due to expressive difficulties; able to move intentionally (B UE/LE) but not with direct commands; able to answer yes/no questioning intermittently      General Comments      Exercises     Assessment/Plan    PT Assessment Patient needs continued PT services  PT Problem List Decreased activity tolerance;Decreased balance;Decreased mobility;Decreased strength;Decreased cognition;Decreased knowledge of use of DME;Decreased safety awareness       PT Treatment Interventions DME instruction;Gait training;Functional mobility training;Therapeutic activities;Therapeutic exercise;Balance training;Patient/family education;Neuromuscular re-education;Cognitive remediation    PT Goals (Current goals can be found in the Care Plan section)  Acute Rehab PT Goals Patient Stated Goal: none stated PT Goal Formulation: With patient/family Time For Goal Achievement: 08/16/17 Potential to Achieve Goals: Fair    Frequency Min 2X/week   Barriers to discharge        Co-evaluation PT/OT/SLP Co-Evaluation/Treatment: Yes Reason for Co-Treatment: Complexity of the patient's impairments (multi-system involvement);Necessary to address cognition/behavior during functional activity;For patient/therapist safety PT goals addressed during session: Mobility/safety with mobility         AM-PAC PT "6 Clicks" Daily Activity  Outcome Measure Difficulty turning over in bed (including adjusting bedclothes, sheets and blankets)?: Unable Difficulty moving from lying on back to sitting on the side of the bed? : Unable Difficulty sitting down on and standing up from a chair with arms (e.g., wheelchair, bedside commode, etc,.)?: Unable Help needed moving to and from a bed to chair (including a wheelchair)?: Total Help needed walking in hospital room?:  Total Help needed climbing 3-5 steps with a railing? : Total 6 Click Score: 6    End of Session   Activity Tolerance: Patient tolerated treatment well Patient left: in bed;with call bell/phone within reach;with bed alarm set Nurse Communication: Mobility status PT Visit Diagnosis: Other abnormalities of gait and mobility (R26.89);Muscle weakness (generalized) (M62.81);History of falling (Z91.81);Difficulty in walking, not elsewhere classified (R26.2)    Time: [id number] PT Time Calculation (min) (ACUTE ONLY): 21 min   Charges:   PT Evaluation $PT Eval Moderate Complexity: 1 Mod     PT G Codes:        Kipp LaurenceStephanie R Aaron, PT, DPT 08/02/17 10:51 AM Pager: [phone]

## 2017-08-02 NOTE — Progress Notes (Signed)
Taking over patient care.

## 2017-08-02 NOTE — Progress Notes (Signed)
  Speech Language Pathology Treatment: Dysphagia  Patient Details Name: Levi Robinson MRN: [id number] DOB: 08/12/1921 Today's Date: 08/02/2017 Time:  -     Assessment / Plan / Recommendation Clinical Impression  Pt exhibited increased alertness today and was able to participate in PO trials.  SLP set up suction and provided thorough oral care prior to administration of POs.  There was an immediate cough with initial trial of thin liquid, but not with additional trials.  Pt intermittently required double swallow with nectar and thin liquid.  Pt exhibited prolonged mastication of solids with mild oral residue.  Oral residue decreased with more efficient oral phase with soft solid trial. Recommend mechanical soft diet with thin liquid.   HPI HPI: Levi Robinson is a 84 y.o. male with medical history significant for DM,  HTN, recent acute subdural hematoma (05/2017), carotid and coronary  disease, CHF, paroxysmal atrial fibrillation.  Patient was brought to the ED by family with complaints of altered mental status. Over the past 5 days patient has physically and cognitively declined, not responding to questions as before, has not been able to ambulate, or feed himself. Head CT-large and right acute on chronic subdural hematoma now measuring 2.8 cm in maximum diameter previously 2.1 cm.  Resultant increased right-to-left midline shift now measuring 8 mm previously 3 mm.  Neurosurgery was consulted in the ED. Granddaughters- Levi Robinson (HCPOA) and Levi Robinson to Dr. Franky Machoabbell on the phone.  Family still declined surgery. Palliative care consulted in the ED-follow as inpatient.       SLP Plan  Continue with current plan of care       Recommendations  Diet recommendations: Dysphagia 3 (mechanical soft);Thin liquid Liquids provided via: Cup;Straw Medication Administration: (No specific precautions) Supervision: Staff to assist with self feeding Compensations: Minimize environmental distractions;Slow  rate;Small sips/bites Postural Changes and/or Swallow Maneuvers: Seated upright 90 degrees                SLP Visit Diagnosis: Dysphagia, unspecified (R13.10) Plan: Continue with current plan of care       GO                Levi Robinson 08/02/2017, 10:52 AM

## 2017-08-02 NOTE — Progress Notes (Signed)
PROGRESS NOTE    DARRILL VREELAND  GUY:[id number] DOB: Jun 18, 1921 DOA: 07/30/2017 PCP: Georgianne Fick, MD    Brief Narrative: Levi Robinson is a 84 y.o. male with medical history significant for DM,  HTN, recent acute subdural hematoma, carotid and coronary  disease, CHF, paroxysmal atrial fibrillation.  Patient is asleep, arouses to voice, but appears lethargic .  History is obtained from family- 2 granddaughters who are at bedside. Patient was brought to the ED by family with complaints of altered mental status.  Over the past 5 days patient has physically and cognitively declined, not responding to questions as before, has not been able to ambulate, or feed himself.    Recent hospital admission 5/24- 06/09/17 after a fall at home with CT head showing significant right subdural hematoma with right greater than left shift.  And initially obtunded, improved.  Family declined any invasive procedures.  Patient was discharged to home.  Since discharge patient's granddaughter, who patient resides with, reports gradual decline in mental status that would improve, but report he has not being this weak for this long.  Patient was previously able to use his walker, not ambulates with wheelchair.  Family reports reduced appetite.  No nausea vomiting or loose stools.  ED Course: Mild intermittent tachypnea to 23, systolic blood pressure 93 improved to 140s.  WBC 6.7.  Troponin mildly elevated 0.1.  Creatinine 1.4 improved compared to baseline.  EKG sinus rhythm.  UA-moderate leukocytes, many bacteria.  Head CT-large and right acute on chronic subdural hematoma now measuring 2.8 cm in maximum diameter previously 2.1 cm.  Resultant increased right-to-left midline shift now measuring 8 mm previously 3 mm.  Neurosurgery was consulted in the ED. Granddaughters- [name] (HCPOA) and [name]-talked to Dr. Franky Macho on the phone.  Family still declined surgery. Palliative care consulted in the ED-follow as inpatient.     Assessment & Plan:   Principal Problem:   Altered mental status Active Problems:   CAD (coronary artery disease)   Carotid arterial disease (HCC)   Nonproliferative diabetic retinopathy (HCC)   Atrial fibrillation with RVR (HCC)   Subdural hematoma, acute (HCC)   Uncontrolled diabetes mellitus (HCC)   DNR (do not resuscitate)   Benign essential HTN   Palliative care patient  1-Subdural Hematoma; Acute encephalopathy , AMS Family discussed with Dr Franky Macho, they have decline surgery.  Patient is lethargic, likely related to subdural.  Continue with  keppra.   He open eyes to voice, say few words.  Family considering SNF, now he is more alert.  PT, Speech evaluation.  Elevation of troponin; Likely related to intracranial bleed.  No further evaluation.   DM; last hemoglobin A1c 9.4 05/2017. Hold levemir. Poor oral intake.   Paroxysmal atrial fibrillation, diastolic CHF, CAD hx-last echo 2013, EF 65- 70%.  Appears stable. Hold diuretics.   UTI;  Urine culture grew enterococcus.  Now on IV ampicillin.  Repeat ua tomorrow.      DVT prophylaxis: none, comfort.  Code Status: DNR Family Communication: son in law at bedside.  Disposition Plan: to be determine, might need to go residential hospice facility   Consultants:   Neurosurgery  palliative   Procedures:    Antimicrobials:   none   Subjective: Open eyes to voice.  Say few words.   Objective: Vitals:   08/01/17 2031 08/01/17 2356 08/02/17 0346 08/02/17 0825  BP: (!) 148/83 116/68 (!) 131/55 (!) 124/58  Pulse: 96 92 64 (!) 52  Resp: 16 16 18  15  Temp: 98.2 F (36.8 C) 98.6 F (37 C) 98.2 F (36.8 C) 98.2 F (36.8 C)  TempSrc: Oral Oral Oral   SpO2: 100% 98% 98% 100%  Weight:      Height:        Intake/Output Summary (Last 24 hours) at 08/02/2017 1130 Last data filed at 08/02/2017 0640 Gross per 24 hour  Intake 847.92 ml  Output 300 ml  Net 547.92 ml   Filed Weights   07/30/17 1447  07/30/17 2100  Weight: 72.6 kg (160 lb) 63.2 kg (139 lb 5.3 oz)    Examination:  General exam: NAD Respiratory system: CTA Cardiovascular system: S 1, S 2 RRR Gastrointestinal system: BS present, soft, nt Central nervous system: Lethargic.  Extremities: no edema     Data Reviewed: I have personally reviewed following labs and imaging studies  CBC: Recent Labs  Lab 07/30/17 1612 08/02/17 0304  WBC 6.7 6.6  NEUTROABS 4.4  --   HGB 14.4 12.6*  HCT 41.9 38.5*  MCV 92.3 94.8  PLT 165 156   Basic Metabolic Panel: Recent Labs  Lab 07/30/17 1612 08/02/17 0304  NA 134* 141  K 4.3 4.5  CL 100 111  CO2 25 20*  GLUCOSE 271* 292*  BUN 22 33*  CREATININE 1.40* 1.37*  CALCIUM 8.8* 8.6*   GFR: Estimated Creatinine Clearance: 30.1 mL/min (A) (by C-G formula based on SCr of 1.37 mg/dL (H)). Liver Function Tests: Recent Labs  Lab 07/30/17 1612  AST 21  ALT 12  ALKPHOS 66  BILITOT 0.8  PROT 7.9  ALBUMIN 3.5   No results for input(s): LIPASE, AMYLASE in the last 168 hours. Recent Labs  Lab 07/30/17 1612  AMMONIA 18   Coagulation Profile: No results for input(s): INR, PROTIME in the last 168 hours. Cardiac Enzymes: Recent Labs  Lab 07/30/17 1612 07/31/17 0027 07/31/17 0627  TROPONINI 0.10* 0.11* 0.11*   BNP (last 3 results) No results for input(s): PROBNP in the last 8760 hours. HbA1C: No results for input(s): HGBA1C in the last 72 hours. CBG: Recent Labs  Lab 08/01/17 1636 08/01/17 2000 08/01/17 2355 08/02/17 0357 08/02/17 0822  GLUCAP 174* 259* 280* 266* 234*   Lipid Profile: No results for input(s): CHOL, HDL, LDLCALC, TRIG, CHOLHDL, LDLDIRECT in the last 72 hours. Thyroid Function Tests: No results for input(s): TSH, T4TOTAL, FREET4, T3FREE, THYROIDAB in the last 72 hours. Anemia Panel: No results for input(s): VITAMINB12, FOLATE, FERRITIN, TIBC, IRON, RETICCTPCT in the last 72 hours. Sepsis Labs: Recent Labs  Lab 07/30/17 1622    LATICACIDVEN 1.59    Recent Results (from the past 240 hour(s))  Culture, Urine     Status: Abnormal   Collection Time: 07/30/17 10:47 PM  Result Value Ref Range Status   Specimen Description URINE, CLEAN CATCH  Final   Special Requests   Final    NONE Performed at [facility], [address] [place], [place] [address]    Culture >=100,000 COLONIES/mL ENTEROCOCCUS FAECALIS (A)  Final   Report Status 08/02/2017 FINAL  Final   Organism ID, Bacteria ENTEROCOCCUS FAECALIS (A)  Final      Susceptibility   Enterococcus faecalis - MIC*    AMPICILLIN <=2 SENSITIVE Sensitive     LEVOFLOXACIN 0.5 SENSITIVE Sensitive     NITROFURANTOIN <=16 SENSITIVE Sensitive     VANCOMYCIN 1 SENSITIVE Sensitive     * >=100,000 COLONIES/mL ENTEROCOCCUS FAECALIS         Radiology Studies: No results found.  Scheduled Meds: . mouth rinse  15 mL Mouth Rinse BID   Continuous Infusions: . sodium chloride 50 mL/hr at 08/01/17 1400  . ampicillin (OMNIPEN) IV    . levETIRAcetam 500 mg (08/02/17 1116)     LOS: 3 days    Time spent: 35 minutes.     Alba Cory, MD [facility] Pager [phone]  If 7PM-7AM, please contact night-coverage [email or web address] Password [email or web address] 08/02/2017, 11:30 AM

## 2017-08-02 NOTE — Evaluation (Signed)
Occupational Therapy Evaluation Patient Details Name: Levi Robinson MRN: [id number] DOB: 10/08/1921 Today's Date: 08/02/2017    History of Present Illness Patient is a 84 y/o male initially presenting to the ED on 07/30/17 due to AMS. Of note, recent hospital admission 5/24- 06/09/17 after a fall at home with CT head showing significant right subdural hematoma with right greater than left shift. Most recent MRI revealing Enlarging right acute on chronic subdural hematoma, now measuring 2.8 cm in maximal diameter, previously 2.1 cm. Resultant increased right-to-left midline shift, now measuring 8 mm, previously 3 mm. PMH significant for DM,  HTN, recent acute subdural hematoma, carotid and coronary  disease, CHF, paroxysmal atrial fibrillation.   Clinical Impression   Per chart review, pt was living with his daughter and had an aide for BADLs. Evaluation limited to bed level due to known enlarging subdural hematoma, per MD/nursing request. Pt currently requiring Total A for ADLs and demonstrating decreased following of cues during grooming task at bed level.  Pt agreeable to PROM stretching of BUEs/BLEs. Limited intentional movement with commands but able to actively lift B UE/LE against gravity for intentional self-stretching. Pt intermittently answering yes/no questions in increased time. Pt would benefit from further acute OT to facilitate safe dc. Recommend dc to SNF for further OT to optimize occupational performance and participation as well as decrease caregiver burden.    Follow Up Recommendations  SNF;Supervision/Assistance - 24 hour    Equipment Recommendations  Other (comment)(TBD at next venue of care) ; Pending information from family   Recommendations for Other Services PT consult     Precautions / Restrictions Precautions Precautions: Fall Restrictions Weight Bearing Restrictions: No      Mobility Bed Mobility Overal bed mobility: Needs Assistance              General bed mobility comments: Assist for repositioning in bed; did not progress bed mobility/transfers per MD/nurse request; able to perform bed level stretching, AROM/PROM of all extremities  Transfers                      Balance                                           ADL either performed or assessed with clinical judgement   ADL Overall ADL's : Needs assistance/impaired     Grooming: Maximal assistance;Bed level;Wash/dry face;Total assistance Grooming Details (indicate cue type and reason): Pt requiring max-total A for washing his face at bed level. with Max cues, pt attempting to reach and grasp wash cloth with RUE, but then not bringing it to his face. Placing pt's right hand over OT wrist and then allowing OT to wash pt's face. Pt facilitating movment of OT hand while holding wrist. Notable decreased strength at RUE                               General ADL Comments: Pt with decreased following of commands. Able to answer yes/no questions with increased time. Pt requiring total A for ADLs. MD requesting decreased acitvity with therapy.     Vision Patient Visual Report: No change from baseline       Perception     Praxis      Pertinent Vitals/Pain Pain Assessment: Faces Faces Pain Scale: No hurt Pain Intervention(s): Monitored  during session     Hand Dominance (Pt reaching with RUE)   Extremity/Trunk Assessment Upper Extremity Assessment Upper Extremity Assessment: RUE deficits/detail;LUE deficits/detail RUE Deficits / Details: Pt performing voluntary movements of RUE. Not following commands for RUE movement. Pt able to bring hand to face. LUE Deficits / Details: Voluntary movement throughout functional ROM today. Minimal movement on command.  Grasp weaker compared to RUE   Lower Extremity Assessment Lower Extremity Assessment: Defer to PT evaluation RLE Deficits / Details: able to assist with lifting LE for SCD  application LLE Deficits / Details: able to assist with lifting LE for SCD application       Communication Communication Communication: Expressive difficulties   Cognition Arousal/Alertness: Awake/alert Behavior During Therapy: Flat affect Overall Cognitive Status: No family/caregiver present to determine baseline cognitive functioning Area of Impairment: Problem solving;Following commands;Attention                   Current Attention Level: Sustained   Following Commands: Follows one step commands inconsistently;Follows one step commands with increased time     Problem Solving: Slow processing;Decreased initiation;Difficulty sequencing;Requires verbal cues;Requires tactile cues General Comments: Pt able to move intentionally (B UE/LE) but not with direct commands. Pt answering yes/no questioning intermittently with increased time. Pt not following commands to wash face, but facilitating RUE movements with therapist hand over hand assist.    General Comments       Exercises Exercises: General Upper Extremity;General Lower Extremity General Exercises - Upper Extremity Shoulder Flexion: PROM;Both;5 reps;Supine Shoulder Extension: PROM;Both;5 reps;Supine Shoulder ABduction: PROM;5 reps;Both;Supine Shoulder ADduction: PROM;Both;5 reps;Supine Elbow Flexion: PROM;Both;10 reps;Supine Elbow Extension: PROM;Both;5 reps;Supine Wrist Flexion: PROM;Both;5 reps;Seated Wrist Extension: PROM;Both;5 reps;Seated General Exercises - Lower Extremity Ankle Circles/Pumps: PROM;Both;5 reps;Supine Quad Sets: PROM;Both;5 reps;Supine   Shoulder Instructions      Home Living Family/patient expects to be discharged to:: Private residence Living Arrangements: Children;Other relatives Available Help at Discharge: Family;Personal care attendant;Available 24 hours/day;Other (Comment) Type of Home: House Home Access: Ramped entrance;Stairs to enter     Home Layout: One level     Bathroom  Shower/Tub: Chief Strategy OfficerTub/shower unit   Bathroom Toilet: Standard     Home Equipment: Environmental consultantWalker - 4 wheels;Walker - 2 wheels;Wheelchair - manual;Grab bars - tub/shower;Tub bench   Additional Comments: living arrangements gleaned from most recent hospital admission; patient is a limited historian  Lives With: Daughter    Prior Functioning/Environment Level of Independence: Needs assistance  Gait / Transfers Assistance Needed: per H&P ws ambualting with RW vs w/c ADL's / Homemaking Assistance Needed: per chart was receiving aide in the home 4-5x/week            OT Problem List: Decreased strength;Decreased range of motion;Decreased activity tolerance;Impaired balance (sitting and/or standing);Decreased safety awareness;Decreased knowledge of use of DME or AE;Decreased knowledge of precautions;Pain      OT Treatment/Interventions: Self-care/ADL training;Therapeutic exercise;Energy conservation;DME and/or AE instruction;Therapeutic activities;Patient/family education;Balance training    OT Goals(Current goals can be found in the care plan section) Acute Rehab OT Goals Patient Stated Goal: none stated OT Goal Formulation: Patient unable to participate in goal setting Time For Goal Achievement: 08/16/17 Potential to Achieve Goals: Good ADL Goals Pt Will Perform Grooming: with mod assist;sitting Pt Will Perform Upper Body Dressing: with mod assist;sitting Pt/caregiver will Perform Home Exercise Program: Increased ROM;Increased strength;Both right and left upper extremity;With minimal assist Additional ADL Goal #1: Caregiver will demonstrate understanding of PROM strentches for BUEs with supervision  OT Frequency: Min 1X/week   Barriers  to D/C:            Co-evaluation PT/OT/SLP Co-Evaluation/Treatment: Yes Reason for Co-Treatment: Complexity of the patient's impairments (multi-system involvement) PT goals addressed during session: Mobility/safety with mobility OT goals addressed during  session: ADL's and self-care      AM-PAC PT "6 Clicks" Daily Activity     Outcome Measure Help from another person eating meals?: Total Help from another person taking care of personal grooming?: Total Help from another person toileting, which includes using toliet, bedpan, or urinal?: Total Help from another person bathing (including washing, rinsing, drying)?: Total Help from another person to put on and taking off regular upper body clothing?: Total Help from another person to put on and taking off regular lower body clothing?: Total 6 Click Score: 6   End of Session Nurse Communication: Mobility status  Activity Tolerance: Patient tolerated treatment well Patient left: with call bell/phone within reach;in bed;with bed alarm set  OT Visit Diagnosis: Other abnormalities of gait and mobility (R26.89);Muscle weakness (generalized) (M62.81);Other symptoms and signs involving cognitive function                Time: 4098-1191 OT Time Calculation (min): 23 min Charges:  OT General Charges $OT Visit: 1 Visit OT Evaluation $OT Eval Moderate Complexity: 1 Mod G-Codes:     Zeinab Rodwell MSOT, OTR/L Acute Rehab Pager: [phone] Office: [phone]  Theodoro Grist Deshante Cassell 08/02/2017, 11:16 AM

## 2017-08-03 LAB — GLUCOSE, CAPILLARY
GLUCOSE-CAPILLARY: 176 mg/dL — AB (ref 70–99)
Glucose-Capillary: 189 mg/dL — ABNORMAL HIGH (ref 70–99)
Glucose-Capillary: 205 mg/dL — ABNORMAL HIGH (ref 70–99)
Glucose-Capillary: 248 mg/dL — ABNORMAL HIGH (ref 70–99)

## 2017-08-03 MED ORDER — ISOSORBIDE MONONITRATE ER 30 MG PO TB24
30.0000 mg | ORAL_TABLET | Freq: Every day | ORAL | Status: DC
  Administered 2017-08-03 – 2017-08-12 (×9): 30 mg via ORAL
  Filled 2017-08-03 (×8): qty 1

## 2017-08-03 NOTE — Progress Notes (Signed)
NOTIFIED HOUSE SUPERVISOR AND [facility] SECURITY REGARDING PRESENCE OF CAMERA AT PATIENTS BEDSIDE. SECURITY RETRIEVED DEVICE AND RELOCATED IT TO 3W STATION.

## 2017-08-03 NOTE — Progress Notes (Addendum)
Daily Progress Note   Patient Name: Levi Robinson       Date: 08/03/2017 DOB: January 11, 1922  Age: 84 y.o. MRN#: [id number] Attending Physician: Levi Cory, MD Primary Care Physician: Levi Fick, MD Admit Date: 07/30/2017  Reason for Consultation/Follow-up: Establishing goals of care and Psychosocial/spiritual support  Subjective: Patient denies pain, states he is not hungry, and he is comfortable.  I spoke with his son in law at bedside.  We talked about the patient is improving as his UTI has likely resolved, he is likely at his new baseline.  He is taking sips of liquids, but is not swallowing the small amounts of food set in his mouth.  We discussed evaluating the patient clinically rather than ordering a new CT scan.  If he was still actively bleeding he would likely not be opening his eyes and speaking, rather he would be showing signs of decline rather than improvement.  I stated his prognosis is likely "weeks" given that he is immobile and not eating enough to support himself.  I spoke with Levi Robinson (HCPOA) on the phone.  Levi Robinson still wants her grandfather to be able to receive IV fluids. She states her grandmother lived another 3 years that way.    She has applied for 24 hour assistance in her home thru the [place] as the patient is a [employer].  If she is unable to get 24 hour care in her home she wants her grandfather to go to long term care as her grand mother did.  I explained that he is eligible for hospice house if she choose to go that route, but they would not do IVF at hospice house.  Otherwise he could go to long term care (paid out of pocket) at a nursing home and hospice could follow or he could go to acute rehab.  Assessment:  84 yo male admitted with enlarging  subdural hematoma with midline shift as well as UTI.  He has improved in that he will now open his eyes and give yes/no answers to simple questions.  However he is not eating enough to sustain himself, his is completely dependent with ADLs and bed bound.  He is unable to raise his head.  Family does not want invasive procedures.  He is nearing end of life.   Patient Profile/HPI:  84 y.o. male, "BB", with past medical history of fall and SDH in late May 2019, paroxysmal afib/aflutter, CAD, and brittle diabetes who was admitted on 07/30/2017 with altered mental status.  ER work up revealed enlargement of the previous SDH including an 8 mm midline shift. (in May midline shift was 3 mm) as well as a UTI.  Family has declined any surgical procedures.  Length of Stay: 4  Current Medications: Scheduled Meds:  . insulin aspart  0-9 Units Subcutaneous TID WC  . isosorbide mononitrate  30 mg Oral QPC lunch  . mouth rinse  15 mL Mouth Rinse BID    Continuous Infusions: . sodium chloride 50 mL/hr at 08/02/17 1429  . ampicillin (OMNIPEN) IV 1 g (08/03/17 0902)  . levETIRAcetam 500 mg (08/03/17 1034)    PRN Meds: acetaminophen **OR** acetaminophen, ondansetron **OR** ondansetron (ZOFRAN) IV, polyethylene glycol  Physical Exam       Well developed elderly male, awake, responsive CV brady resp no distress Abdomen soft, nt, nd  Vital Signs: BP (!) 167/66 (BP Location: Right Arm)   Pulse (!) 53   Temp 98.1 F (36.7 C) (Axillary)   Resp 18   Ht 5\' 10"  (1.778 m)   Wt 63.2 kg (139 lb 5.3 oz)   SpO2 98%   BMI 19.99 kg/m  SpO2: SpO2: 98 % O2 Device: O2 Device: Room Air O2 Flow Rate:    Intake/output summary:   Intake/Output Summary (Last 24 hours) at 08/03/2017 1242 Last data filed at 08/03/2017 1030 Gross per 24 hour  Intake 1708.49 ml  Output 1180 ml  Net 528.49 ml   LBM: Last BM Date: (PTA) Baseline Weight: Weight: 72.6 kg (160 lb) Most recent weight: Weight: 63.2 kg (139 lb 5.3 oz)        Palliative Assessment/Data:  20%     Patient Active Problem List   Diagnosis Date Noted  . Palliative care patient   . PAF (paroxysmal atrial fibrillation) (HCC)   . Benign essential HTN   . Stage 3 chronic kidney disease (HCC)   . Atrial fibrillation with RVR (HCC) 06/05/2017  . Subdural hematoma, acute (HCC) 06/05/2017  . Altered mental status 06/05/2017  . Acute CHF (congestive heart failure) (HCC) 06/05/2017  . Fall 06/05/2017  . Uncontrolled diabetes mellitus (HCC) 06/05/2017  . DNR (do not resuscitate) 06/05/2017  . Subdural hematoma (HCC) 06/05/2017  . Occlusion and stenosis of carotid artery without mention of cerebral infarction 08/04/2011  . Hypertension   . CAD (coronary artery disease)   . Carotid arterial disease (HCC)   . Anemia   . Posterior vitreous detachment 06/05/2011  . Status post intraocular lens implant 06/05/2011  . Branch retinal vein occlusion 12/12/2010  . Nonproliferative diabetic retinopathy (HCC) 12/12/2010    Palliative Care Plan    Recommendations/Plan:  Ideally home with 24 hour care (this is what the family is working towards).  Otherwise long term care at a nursing facility with Hospice.  If he is sent to acute rehab he will likely return to the hospital within days.  Goals of Care and Additional Recommendations:  Limitations on Scope of Treatment: Focus on comfort, no invasive procedures.  Code Status:  DNR  Prognosis:   likely weeks in the setting of a intracranial bleed with midline shift, advanced age, immobility, and minimal PO intake.  Discharge Planning:  Skilled Nursing Facility with Hospice vs Home with 24 hour care.  Care plan was discussed with grand daughter, HCPOA.  Thank you for  allowing the Palliative Medicine Team to assist in the care of this patient.  Total time spent:  60 min.  PMT will shadow the chart for changes.  Please call us back if his needs change.      Greater than 50%  of this time was  spent counseling and coordinating care related to the above assessment and plan.  Levi Richards, PA-C Palliative Medicine  Please contact Palliative MedicineTeam phone at [phone] for questions and concerns between 7 am - 7 pm.   Please see AMION for individual provider pager numbers.

## 2017-08-03 NOTE — Progress Notes (Signed)
PROGRESS NOTE    Levi Robinson  ZOX:[id number] DOB: 1921-02-07 DOA: 07/30/2017 PCP: Levi Fick, MD    Brief Narrative: Levi Robinson is a 84 y.o. male with medical history significant for DM,  HTN, recent acute subdural hematoma, carotid and coronary  disease, CHF, paroxysmal atrial fibrillation.  Patient is asleep, arouses to voice, but appears lethargic .  History is obtained from family- 2 granddaughters who are at bedside. Patient was brought to the ED by family with complaints of altered mental status.  Over the past 5 days patient has physically and cognitively declined, not responding to questions as before, has not been able to ambulate, or feed himself.    Recent hospital admission 5/24- 06/09/17 after a fall at home with CT head showing significant right subdural hematoma with right greater than left shift.  And initially obtunded, improved.  Family declined any invasive procedures.  Patient was discharged to home.  Since discharge patient's granddaughter, who patient resides with, reports gradual decline in mental status that would improve, but report he has not being this weak for this long.  Patient was previously able to use his walker, not ambulates with wheelchair.  Family reports reduced appetite.  No nausea vomiting or loose stools.  ED Course: Mild intermittent tachypnea to 23, systolic blood pressure 93 improved to 140s.  WBC 6.7.  Troponin mildly elevated 0.1.  Creatinine 1.4 improved compared to baseline.  EKG sinus rhythm.  UA-moderate leukocytes, many bacteria.  Head CT-large and right acute on chronic subdural hematoma now measuring 2.8 cm in maximum diameter previously 2.1 cm.  Resultant increased right-to-left midline shift now measuring 8 mm previously 3 mm.  Neurosurgery was consulted in the ED. Granddaughters- Levi Robinson (HCPOA) and Levi Robinson-talked to Dr. Franky Robinson on the phone.  Family still declined surgery. Palliative care consulted in the ED-follow as inpatient.     Assessment & Plan:   Principal Problem:   Altered mental status Active Problems:   CAD (coronary artery disease)   Carotid arterial disease (HCC)   Nonproliferative diabetic retinopathy (HCC)   Atrial fibrillation with RVR (HCC)   Subdural hematoma, acute (HCC)   Uncontrolled diabetes mellitus (HCC)   DNR (do not resuscitate)   Benign essential HTN   Palliative care patient  1-Subdural Hematoma; Acute encephalopathy , AMS Family discussed with Dr Levi Robinson, they have decline surgery.  Patient AMS, likely related to subdural.  Continue with  keppra.   Family considering SNF, vs home with hospice.  PT, Speech evaluation. He is alert, say good morning, will monitor oral intake.  Repeating CT head wont change management, discussed with Dr Levi Robinson.   Elevation of troponin; Likely related to intracranial bleed.  No further evaluation.   DM; last hemoglobin A1c 9.4 05/2017. Hold levemir. Poor oral intake.  SSI will cover for CBG more than 200.   Paroxysmal atrial fibrillation, diastolic CHF, CAD hx-last echo 2013, EF 65- 70%.  Appears stable. Hold diuretics.   UTI;  Urine culture grew enterococcus.  Now on IV ampicillin.  Repeat ua today      DVT prophylaxis: none, comfort.  Code Status: DNR Family Communication: son in law at bedside.  Disposition Plan: to be determine, might need to go residential hospice facility   Consultants:   Neurosurgery  palliative   Procedures:    Antimicrobials:   none   Subjective: He open eyes to voice, he say good morning.  He denies pain.    Objective: Vitals:   08/02/17 2356 08/03/17 0413  08/03/17 0746 08/03/17 1244  BP: (!) 132/56 (!) 148/74 (!) 167/66 (!) 145/54  Pulse: (!) 56 (!) 55 (!) 53 (!) 49  Resp: 16 18 18 18   Temp: 97.7 F (36.5 C) 97.7 F (36.5 C) 98.1 F (36.7 C) 98.1 F (36.7 C)  TempSrc: Oral  Axillary Axillary  SpO2: 96% 99% 98% 100%  Weight:      Height:        Intake/Output Summary  (Last 24 hours) at 08/03/2017 1353 Last data filed at 08/03/2017 1030 Gross per 24 hour  Intake 1708.49 ml  Output 1180 ml  Net 528.49 ml   Filed Weights   07/30/17 1447 07/30/17 2100  Weight: 72.6 kg (160 lb) 63.2 kg (139 lb 5.3 oz)    Examination:  General exam: NAD Respiratory system: CTA Cardiovascular system: S 1, S 2 RRR Gastrointestinal system: BS present, soft, nt Central nervous system: intermittent lethargic, moves upper extremity. LE left some movement.  Extremities: no edema     Data Reviewed: I have personally reviewed following labs and imaging studies  CBC: Recent Labs  Lab 07/30/17 1612 08/02/17 0304  WBC 6.7 6.6  NEUTROABS 4.4  --   HGB 14.4 12.6*  HCT 41.9 38.5*  MCV 92.3 94.8  PLT 165 156   Basic Metabolic Panel: Recent Labs  Lab 07/30/17 1612 08/02/17 0304  NA 134* 141  K 4.3 4.5  CL 100 111  CO2 25 20*  GLUCOSE 271* 292*  BUN 22 33*  CREATININE 1.40* 1.37*  CALCIUM 8.8* 8.6*   GFR: Estimated Creatinine Clearance: 30.1 mL/min (A) (by C-G formula based on SCr of 1.37 mg/dL (H)). Liver Function Tests: Recent Labs  Lab 07/30/17 1612  AST 21  ALT 12  ALKPHOS 66  BILITOT 0.8  PROT 7.9  ALBUMIN 3.5   No results for input(s): LIPASE, AMYLASE in the last 168 hours. Recent Labs  Lab 07/30/17 1612  AMMONIA 18   Coagulation Profile: No results for input(s): INR, PROTIME in the last 168 hours. Cardiac Enzymes: Recent Labs  Lab 07/30/17 1612 07/31/17 0027 07/31/17 0627  TROPONINI 0.10* 0.11* 0.11*   BNP (last 3 results) No results for input(s): PROBNP in the last 8760 hours. HbA1C: No results for input(s): HGBA1C in the last 72 hours. CBG: Recent Labs  Lab 08/02/17 1148 08/02/17 1612 08/02/17 2132 08/03/17 0643 08/03/17 1111  GLUCAP 261* 202* 201* 189* 176*   Lipid Profile: No results for input(s): CHOL, HDL, LDLCALC, TRIG, CHOLHDL, LDLDIRECT in the last 72 hours. Thyroid Function Tests: No results for input(s):  TSH, T4TOTAL, FREET4, T3FREE, THYROIDAB in the last 72 hours. Anemia Panel: No results for input(s): VITAMINB12, FOLATE, FERRITIN, TIBC, IRON, RETICCTPCT in the last 72 hours. Sepsis Labs: Recent Labs  Lab 07/30/17 1622  LATICACIDVEN 1.59    Recent Results (from the past 240 hour(s))  Culture, Urine     Status: Abnormal   Collection Time: 07/30/17 10:47 PM  Result Value Ref Range Status   Specimen Description URINE, CLEAN CATCH  Final   Special Requests   Final    NONE Performed at [facility], [address] [place], [place] [address]    Culture >=100,000 COLONIES/mL ENTEROCOCCUS FAECALIS (A)  Final   Report Status 08/02/2017 FINAL  Final   Organism ID, Bacteria ENTEROCOCCUS FAECALIS (A)  Final      Susceptibility   Enterococcus faecalis - MIC*    AMPICILLIN <=2 SENSITIVE Sensitive     LEVOFLOXACIN 0.5 SENSITIVE Sensitive  NITROFURANTOIN <=16 SENSITIVE Sensitive     VANCOMYCIN 1 SENSITIVE Sensitive     * >=100,000 COLONIES/mL ENTEROCOCCUS FAECALIS         Radiology Studies: No results found.      Scheduled Meds: . insulin aspart  0-9 Units Subcutaneous TID WC  . isosorbide mononitrate  30 mg Oral QPC lunch  . mouth rinse  15 mL Mouth Rinse BID   Continuous Infusions: . sodium chloride 50 mL/hr at 08/02/17 1429  . ampicillin (OMNIPEN) IV 1 g (08/03/17 0902)  . levETIRAcetam 500 mg (08/03/17 1034)     LOS: 4 days    Time spent: 35 minutes.     Alba Cory, MD [facility] Pager [phone]  If 7PM-7AM, please contact night-coverage [email or web address] Password [facility] 08/03/2017, 1:53 PM

## 2017-08-03 NOTE — Care Management Important Message (Signed)
Important Message  Patient Details  Name: Levi Robinson MRN: [id number] Date of Birth: 03/07/1921   Medicare Important Message Given:  Yes    [name] Stefan ChurchBratton 08/03/2017, 9:49 AM

## 2017-08-03 NOTE — Progress Notes (Signed)
Nutrition Follow-up  DOCUMENTATION CODES:   Not applicable  INTERVENTION:  - Will monitor for ongoing POC/GOC. - Will perform NFPE at follow-up if appropriate.   NUTRITION DIAGNOSIS:   Inadequate oral intake related to lethargy/confusion as evidenced by other (comment)(no documented intakes, did not have breakfast 7/22 AM, a/o self only and notes indicating unresponsiveness.). -ongoing/revised  GOAL:   Patient will meet greater than or equal to 90% of their needs -unmet  MONITOR:   PO intake, Weight trends, Labs, I & O's, Other (Comment)(POC/GOC)  ASSESSMENT:   [name] with PMH of DM, HTN, recent acute subdural hematoma, carotid and coronary disease, CHF, paroxysmal atrial fibrillation; family brought pt in ED for altered mental status.  No new weight since admission on 7/18. Diet advanced from NPO to Dysphagia 3, thin liquids yesterday at 10:48 AM and no intakes documented since that time. Diet advancement occurred after SLP assessment yesterday morning. Flow sheet indicates that patient is a/o to self only. Tech at bedside bathing patient and states that she did not see a breakfast tray for patient today. No family/visitors present at bedside.  Palliative Care following patient and saw him/talked with family on 7/20 afternoon. Note indicates that patient's family hopeful for some improvement and wanted to wait until Monday (today) "to continue to observe for further improvement before" coming to a decision. Palliative note stated that if no meaningful improvement seen then patient would be appropriate for residential hospice placement.   Per Dr. Sumner Boastegalado's note yesterday morning: subdural hematoma, acute encephalopathy, AMS, family declined surgery, last HgbA1c was 9.4% in May 2019, UTI with plan to repeat UA today (7/22).  Medications reviewed; sliding scale Novolog.  Labs reviewed; CBG: 189 mg/dL, BUN: 33 mg/dL, creatinine: 9.561.37 mg/dL, Ca: 8.6 mg/dL, GFR: 43 mL/min.     IVF: NS @ 50 mL/hr.    Diet Order:   Diet Order           DIET DYS 3 Room service appropriate? Yes; Fluid consistency: Thin  Diet effective now          EDUCATION NEEDS:   Not appropriate for education at this time  Skin:  Skin Assessment: Reviewed RN Assessment  Last BM:  7/22  Height:   Ht Readings from Last 1 Encounters:  07/30/17 5\' 10"  (1.778 m)    Weight:   Wt Readings from Last 1 Encounters:  07/30/17 139 lb 5.3 oz (63.2 kg)    Ideal Body Weight:     BMI:  Body mass index is 19.99 kg/m.  Estimated Nutritional Needs:   Kcal:  1300-1500  Protein:  65-80 gm  Fluid:  >/= 1.5 L     Trenton GammonJessica Jazaria Jarecki, MS, RD, LDN, [facility] Inpatient Clinical Dietitian Pager # [phone] After hours/weekend pager # [phone]

## 2017-08-04 ENCOUNTER — Other Ambulatory Visit: Payer: Self-pay | Admitting: *Deleted

## 2017-08-04 LAB — URINALYSIS, ROUTINE W REFLEX MICROSCOPIC
Bilirubin Urine: NEGATIVE
Glucose, UA: >=500 mg/dL — AB
Ketones, ur: NEGATIVE mg/dL
Nitrite: NEGATIVE
PROTEIN: NEGATIVE mg/dL
Specific Gravity, Urine: 1.02 (ref 1.005–1.030)
pH: 5 (ref 5.0–8.0)

## 2017-08-04 LAB — GLUCOSE, CAPILLARY
GLUCOSE-CAPILLARY: 191 mg/dL — AB (ref 70–99)
GLUCOSE-CAPILLARY: 181 mg/dL — AB (ref 70–99)
GLUCOSE-CAPILLARY: 152 mg/dL — AB (ref 70–99)
Glucose-Capillary: 178 mg/dL — ABNORMAL HIGH (ref 70–99)

## 2017-08-04 NOTE — Progress Notes (Signed)
PROGRESS NOTE    Levi Robinson  ZOX:[id number] DOB: Dec 05, 1921 DOA: 07/30/2017 PCP: Georgianne Fick, MD    Brief Narrative: Levi Robinson is a 84 y.o. male with medical history significant for DM,  HTN, recent acute subdural hematoma, carotid and coronary  disease, CHF, paroxysmal atrial fibrillation.  Patient is asleep, arouses to voice, but appears lethargic .  History is obtained from family- 2 granddaughters who are at bedside. Patient was brought to the ED by family with complaints of altered mental status.  Over the past 5 days patient has physically and cognitively declined, not responding to questions as before, has not been able to ambulate, or feed himself.    Recent hospital admission 5/24- 06/09/17 after a fall at home with CT head showing significant right subdural hematoma with right greater than left shift.  And initially obtunded, improved.  Family declined any invasive procedures.  Patient was discharged to home.  Since discharge patient's granddaughter, who patient resides with, reports gradual decline in mental status that would improve, but report he has not being this weak for this long.  Patient was previously able to use his walker, not ambulates with wheelchair.  Family reports reduced appetite.  No nausea vomiting or loose stools.  ED Course: Mild intermittent tachypnea to 23, systolic blood pressure 93 improved to 140s.  WBC 6.7.  Troponin mildly elevated 0.1.  Creatinine 1.4 improved compared to baseline.  EKG sinus rhythm.  UA-moderate leukocytes, many bacteria.  Head CT-large and right acute on chronic subdural hematoma now measuring 2.8 cm in maximum diameter previously 2.1 cm.  Resultant increased right-to-left midline shift now measuring 8 mm previously 3 mm.  Neurosurgery was consulted in the ED. Granddaughters- [name] (HCPOA) and [name]-talked to Dr. Franky Macho on the phone.  Family still declined surgery. Palliative care consulted in the ED-follow as inpatient.     Assessment & Plan:   Principal Problem:   Altered mental status Active Problems:   CAD (coronary artery disease)   Carotid arterial disease (HCC)   Nonproliferative diabetic retinopathy (HCC)   Atrial fibrillation with RVR (HCC)   Subdural hematoma, acute (HCC)   Uncontrolled diabetes mellitus (HCC)   DNR (do not resuscitate)   Benign essential HTN   Palliative care patient  1-Subdural Hematoma; Acute encephalopathy , AMS Family discussed with Dr Franky Macho, they have decline surgery.  Patient AMS, likely related to subdural.  Continue with  keppra.   Family considering SNF, vs home with hospice.  PT, Speech evaluation. Repeating CT head wont change management, discussed with Dr Ailene Rud.  He was asleep on my arrival, say few word. Went back to sleep.   Elevation of troponin; Likely related to intracranial bleed.  No further evaluation.   DM; last hemoglobin A1c 9.4 05/2017. Hold levemir. Poor oral intake.  SSI will cover for CBG more than 200.   Paroxysmal atrial fibrillation, diastolic CHF, CAD hx-last echo 2013, EF 65- 70%.  Appears stable. Hold diuretics.   UTI;  Urine culture grew enterococcus.  Now on IV ampicillin.  Repeat ua today . Try to get UA, he is incontinent.      DVT prophylaxis: none, comfort.  Code Status: DNR Family Communication: no family at bedside.  Disposition Plan: family considering SNF, but patient has not been eating enough. Palliative to meet with granddaughter today   Consultants:   Neurosurgery  palliative   Procedures:    Antimicrobials:   none   Subjective: Sleepy, open eyes, say few words, go back  to sleep/   Objective: Vitals:   08/04/17 0052 08/04/17 0351 08/04/17 0834 08/04/17 1252  BP: (!) 145/62 127/64 (!) 121/58 (!) 153/70  Pulse: (!) 58 (!) 56 (!) 58 (!) 54  Resp: 18 16 18 20   Temp: 97.8 F (36.6 C)  (!) 97.5 F (36.4 C)   TempSrc: Axillary  Oral   SpO2: 100% 100% 100% 100%  Weight:      Height:         Intake/Output Summary (Last 24 hours) at 08/04/2017 1412 Last data filed at 08/04/2017 1000 Gross per 24 hour  Intake 1564.5 ml  Output -  Net 1564.5 ml   Filed Weights   07/30/17 1447 07/30/17 2100  Weight: 72.6 kg (160 lb) 63.2 kg (139 lb 5.3 oz)    Examination:  General exam: NAD Respiratory system: CTA Cardiovascular system: S 1, S 2  Gastrointestinal system: BS present, soft, nt Central nervous system: lethargic Extremities: no edema     Data Reviewed: I have personally reviewed following labs and imaging studies  CBC: Recent Labs  Lab 07/30/17 1612 08/02/17 0304  WBC 6.7 6.6  NEUTROABS 4.4  --   HGB 14.4 12.6*  HCT 41.9 38.5*  MCV 92.3 94.8  PLT 165 156   Basic Metabolic Panel: Recent Labs  Lab 07/30/17 1612 08/02/17 0304  NA 134* 141  K 4.3 4.5  CL 100 111  CO2 25 20*  GLUCOSE 271* 292*  BUN 22 33*  CREATININE 1.40* 1.37*  CALCIUM 8.8* 8.6*   GFR: Estimated Creatinine Clearance: 30.1 mL/min (A) (by C-G formula based on SCr of 1.37 mg/dL (H)). Liver Function Tests: Recent Labs  Lab 07/30/17 1612  AST 21  ALT 12  ALKPHOS 66  BILITOT 0.8  PROT 7.9  ALBUMIN 3.5   No results for input(s): LIPASE, AMYLASE in the last 168 hours. Recent Labs  Lab 07/30/17 1612  AMMONIA 18   Coagulation Profile: No results for input(s): INR, PROTIME in the last 168 hours. Cardiac Enzymes: Recent Labs  Lab 07/30/17 1612 07/31/17 0027 07/31/17 0627  TROPONINI 0.10* 0.11* 0.11*   BNP (last 3 results) No results for input(s): PROBNP in the last 8760 hours. HbA1C: No results for input(s): HGBA1C in the last 72 hours. CBG: Recent Labs  Lab 08/03/17 1111 08/03/17 1641 08/03/17 2132 08/04/17 0635 08/04/17 1144  GLUCAP 176* 205* 248* 191* 181*   Lipid Profile: No results for input(s): CHOL, HDL, LDLCALC, TRIG, CHOLHDL, LDLDIRECT in the last 72 hours. Thyroid Function Tests: No results for input(s): TSH, T4TOTAL, FREET4, T3FREE, THYROIDAB in  the last 72 hours. Anemia Panel: No results for input(s): VITAMINB12, FOLATE, FERRITIN, TIBC, IRON, RETICCTPCT in the last 72 hours. Sepsis Labs: Recent Labs  Lab 07/30/17 1622  LATICACIDVEN 1.59    Recent Results (from the past 240 hour(s))  Culture, Urine     Status: Abnormal   Collection Time: 07/30/17 10:47 PM  Result Value Ref Range Status   Specimen Description URINE, CLEAN CATCH  Final   Special Requests   Final    NONE Performed at [facility], [address] [place], [place] [address]    Culture >=100,000 COLONIES/mL ENTEROCOCCUS FAECALIS (A)  Final   Report Status 08/02/2017 FINAL  Final   Organism ID, Bacteria ENTEROCOCCUS FAECALIS (A)  Final      Susceptibility   Enterococcus faecalis - MIC*    AMPICILLIN <=2 SENSITIVE Sensitive     LEVOFLOXACIN 0.5 SENSITIVE Sensitive     NITROFURANTOIN <=  16 SENSITIVE Sensitive     VANCOMYCIN 1 SENSITIVE Sensitive     * >=100,000 COLONIES/mL ENTEROCOCCUS FAECALIS         Radiology Studies: No results found.      Scheduled Meds: . insulin aspart  0-9 Units Subcutaneous TID WC  . isosorbide mononitrate  30 mg Oral QPC lunch  . mouth rinse  15 mL Mouth Rinse BID   Continuous Infusions: . sodium chloride 50 mL/hr at 08/03/17 1913  . ampicillin (OMNIPEN) IV 1 g (08/04/17 0802)  . levETIRAcetam 500 mg (08/04/17 0926)     LOS: 5 days    Time spent: 35 minutes.     Levi CoryBelkys A Baxter Gonzalez, MD [facility] Pager [phone]  If 7PM-7AM, please contact night-coverage [email or web address] Password [email or web address] 08/04/2017, 2:12 PM

## 2017-08-04 NOTE — Progress Notes (Signed)
SLP Cancellation Note  Patient Details Name: Levi Robinson MRN: [id number] DOB: 11/24/1921   Cancelled treatment:       Reason Eval/Treat Not Completed: Other (comment)(pt comfort care only at this time)   Levi Robinson, Levi Robinson 08/04/2017, 8:57 AM  Levi Burnetamara Koi Yarbro, MS [facility] SLP [phone]

## 2017-08-04 NOTE — Progress Notes (Signed)
RN assisted pt with meals today but pt declined. Although he verbalized to RN that he "wants to eat" but spit out food with each bite. Family and RN discussed his eating habit today. Will continue to monitor.   Sim BoastHavy, RN

## 2017-08-04 NOTE — Patient Outreach (Signed)
[facility]) Care Management  08/04/2017  Levi MedicoBernice L Robinson 07/28/1921 [id number]   RN Health Coach Hospitalization  Referral Date:06/16/2017 Referral Source:[facility] Primary Care Navigator Reason for Referral:"further assistance in managing chronic health issues and provide information/health education on ways to further manage DM and other health issues after discharge" Insurance:[employer] Medicare   Outreach Attempt:  [facility]2017 for worsening Subdural Hematoma.  Palliative has been consulted and following.  Hospital Liaison is following as well for discharge plans and needs.  Plan:  RN Health Coach will [facility].  Rhae LernerFarrah Teneshia Hedeen RN [facility] Care [facility]  RN Health Coach [phone] [email or web address]

## 2017-08-05 LAB — GLUCOSE, CAPILLARY
GLUCOSE-CAPILLARY: 251 mg/dL — AB (ref 70–99)
GLUCOSE-CAPILLARY: 204 mg/dL — AB (ref 70–99)
Glucose-Capillary: 189 mg/dL — ABNORMAL HIGH (ref 70–99)
Glucose-Capillary: 201 mg/dL — ABNORMAL HIGH (ref 70–99)

## 2017-08-05 LAB — BASIC METABOLIC PANEL
ANION GAP: 8 (ref 5–15)
BUN: 12 mg/dL (ref 8–23)
CO2: 19 mmol/L — ABNORMAL LOW (ref 22–32)
Calcium: 8 mg/dL — ABNORMAL LOW (ref 8.9–10.3)
Chloride: 111 mmol/L (ref 98–111)
Creatinine, Ser: 0.87 mg/dL (ref 0.61–1.24)
GFR calc Af Amer: >60 mL/min (ref >60)
Glucose, Bld: 276 mg/dL — ABNORMAL HIGH (ref 70–99)
POTASSIUM: 3.9 mmol/L (ref 3.5–5.1)
SODIUM: 138 mmol/L (ref 135–145)

## 2017-08-05 MED ORDER — AMPICILLIN SODIUM 1 G IJ SOLR
1.0000 g | Freq: Four times a day (QID) | INTRAMUSCULAR | Status: DC
  Administered 2017-08-05 – 2017-08-07 (×8): 1 g via INTRAVENOUS
  Filled 2017-08-05 (×10): qty 1000

## 2017-08-05 MED ORDER — SODIUM BICARBONATE 650 MG PO TABS
650.0000 mg | ORAL_TABLET | Freq: Two times a day (BID) | ORAL | Status: DC
  Administered 2017-08-05 – 2017-08-07 (×5): 650 mg via ORAL
  Filled 2017-08-05 (×5): qty 1

## 2017-08-05 NOTE — Progress Notes (Signed)
Physical Therapy Treatment Patient Details Name: Levi MedicoBernice L Fahrner MRN: [id number] DOB: 09/07/1921 Today's Date: 08/05/2017    History of Present Illness Patient is a 84 y/o male initially presenting to the ED on 07/30/17 due to AMS. Of note, recent hospital admission 5/24- 06/09/17 after a fall at home with CT head showing significant right subdural hematoma with right greater than left shift. Most recent MRI revealing Enlarging right acute on chronic subdural hematoma, now measuring 2.8 cm in maximal diameter, previously 2.1 cm. Resultant increased right-to-left midline shift, now measuring 8 mm, previously 3 mm. PMH significant for DM,  HTN, recent acute subdural hematoma, carotid and coronary  disease, CHF, paroxysmal atrial fibrillation.    PT Comments    Patient requires total A for all mobility this session. Pt with orthostatic BP in sitting. Continue to progress as tolerated with anticipated d/c to SNF for further skilled PT services.    BP in supine 162/71 (94) pulse 63 BP in initial sitting position 146/69 (94) pulse 73 BP in sitting with time 129/60 (78) pulse 78   Follow Up Recommendations  SNF;Supervision/Assistance - 24 hour     Equipment Recommendations  None recommended by PT    Recommendations for Other Services       Precautions / Restrictions Precautions Precautions: Fall Restrictions Weight Bearing Restrictions: No    Mobility  Bed Mobility Overal bed mobility: Needs Assistance Bed Mobility: Supine to Sit;Sit to Supine     Supine to sit: +2 for physical assistance;Total assist Sit to supine: +2 for physical assistance;Total assist   General bed mobility comments: pt requires assistance for all aspects of bed mobility   Transfers                 General transfer comment: deferred for safety  Ambulation/Gait                 Stairs             Wheelchair Mobility    Modified Rankin (Stroke Patients Only)       Balance                                             Cognition Arousal/Alertness: Lethargic Behavior During Therapy: Flat affect Overall Cognitive Status: No family/caregiver present to determine baseline cognitive functioning                                 General Comments: pt did not attempt to verbalize during session; pt did keep eyes open most of session while mobilizing and tracks visually  pt did visually track therapist with name call "BB". pt with minimal responses in general. pt does not follow simple commands.       Exercises      General Comments General comments (skin integrity, edema, etc.): VSS noted to show orthostatic SBP with 40 point drop. ( SEE vitals for values by PTA) pt with MAP 96 dropping to 70s.       Pertinent Vitals/Pain Pain Assessment: Faces Faces Pain Scale: Hurts even more Pain Location: R UE (seemed to subside with doffing of splint) Pain Descriptors / Indicators: Grimacing Pain Intervention(s): Monitored during session;Repositioned    Home Living  Prior Function            PT Goals (current goals can now be found in the care plan section) Acute Rehab PT Goals Patient Stated Goal: none stated PT Goal Formulation: With patient/family Time For Goal Achievement: 08/16/17 Potential to Achieve Goals: Fair Progress towards PT goals: Progressing toward goals    Frequency    Min 2X/week      PT Plan Current plan remains appropriate    Co-evaluation PT/OT/SLP Co-Evaluation/Treatment: Yes Reason for Co-Treatment: Complexity of the patient's impairments (multi-system involvement);Necessary to address cognition/behavior during functional activity;For patient/therapist safety;To address functional/ADL transfers PT goals addressed during session: Mobility/safety with mobility OT goals addressed during session: ADL's and self-care;Proper use of Adaptive equipment and DME;Strengthening/ROM       AM-PAC PT "6 Clicks" Daily Activity  Outcome Measure  Difficulty turning over in bed (including adjusting bedclothes, sheets and blankets)?: Unable Difficulty moving from lying on back to sitting on the side of the bed? : Unable Difficulty sitting down on and standing up from a chair with arms (e.g., wheelchair, bedside commode, etc,.)?: Unable Help needed moving to and from a bed to chair (including a wheelchair)?: Total Help needed walking in hospital room?: Total Help needed climbing 3-5 steps with a railing? : Total 6 Click Score: 6    End of Session   Activity Tolerance: Patient tolerated treatment well Patient left: in bed;with call bell/phone within reach;with bed alarm set;with SCD's reapplied Nurse Communication: Mobility status PT Visit Diagnosis: Other abnormalities of gait and mobility (R26.89);Muscle weakness (generalized) (M62.81);History of falling (Z91.81);Difficulty in walking, not elsewhere classified (R26.2)     Time: 0929-1000 PT Time Calculation (min) (ACUTE ONLY): 31 min  Charges:  $Therapeutic Activity: 8-22 mins                    G Codes:       Erline Levine, PTA Pager: [phone]     Carolynne Edouard 08/05/2017, 3:03 PM

## 2017-08-05 NOTE — Progress Notes (Signed)
PHARMACY NOTE:  ANTIMICROBIAL RENAL DOSAGE ADJUSTMENT  Current antimicrobial regimen includes a mismatch between antimicrobial dosage and estimated renal function.  As per policy approved by the Pharmacy & Therapeutics and Medical Executive Committees, the antimicrobial dosage will be adjusted accordingly.  Current antimicrobial dosage:  Ampicillin 1gm IV q8h  Indication: UTI  Renal Function:  Estimated Creatinine Clearance: 47.4 mL/min (by C-G formula based on SCr of 0.87 mg/dL). []      On intermittent HD, scheduled: []      On CRRT    Antimicrobial dosage has been changed to:  Ampicillin 1gm IV q6h  Additional comments: Renal function improved, Increasing ampicillin   Denya Buckingham A. Jeanella CrazePierce, PharmD, BCPS Clinical Pharmacist [place] Pager: [phone] Please utilize Amion for appropriate phone number to reach the unit pharmacist [facility])   08/05/2017 2:15 PM

## 2017-08-05 NOTE — Progress Notes (Signed)
PROGRESS NOTE    Levi MedicoBernice L Ernster  WUJ:[id number] DOB: 12/19/1921 DOA: 07/30/2017 PCP: Georgianne Fickamachandran, Ajith, MD    Brief Narrative: Levi Robinson is a 84 y.o. male with medical history significant for DM,  HTN, recent acute subdural hematoma, carotid and coronary  disease, CHF, paroxysmal atrial fibrillation.  Patient is asleep, arouses to voice, but appears lethargic .  History is obtained from family- 2 granddaughters who are at bedside. Patient was brought to the ED by family with complaints of altered mental status.  Over the past 5 days patient has physically and cognitively declined, not responding to questions as before, has not been able to ambulate, or feed himself.    Recent hospital admission 5/24- 06/09/17 after a fall at home with CT head showing significant right subdural hematoma with right greater than left shift.  And initially obtunded, improved.  Family declined any invasive procedures.  Patient was discharged to home.  Since discharge patient's granddaughter, who patient resides with, reports gradual decline in mental status that would improve, but report he has not being this weak for this long.  Patient was previously able to use his walker, not ambulates with wheelchair.  Family reports reduced appetite.  No nausea vomiting or loose stools.  ED Course: Mild intermittent tachypnea to 23, systolic blood pressure 93 improved to 140s.  WBC 6.7.  Troponin mildly elevated 0.1.  Creatinine 1.4 improved compared to baseline.  EKG sinus rhythm.  UA-moderate leukocytes, many bacteria.  Head CT-large and right acute on chronic subdural hematoma now measuring 2.8 cm in maximum diameter previously 2.1 cm.  Resultant increased right-to-left midline shift now measuring 8 mm previously 3 mm.  Neurosurgery was consulted in the ED. Granddaughters- [name] (HCPOA) and [name]-talked to Dr. Franky Machoabbell on the phone.  Family still declined surgery. Palliative care consulted in the ED-follow as inpatient.     Assessment & Plan:   Principal Problem:   Altered mental status Active Problems:   CAD (coronary artery disease)   Carotid arterial disease (HCC)   Nonproliferative diabetic retinopathy (HCC)   Atrial fibrillation with RVR (HCC)   Subdural hematoma, acute (HCC)   Uncontrolled diabetes mellitus (HCC)   DNR (do not resuscitate)   Benign essential HTN   Palliative care patient  1-Subdural Hematoma; Acute encephalopathy , AMS Family discussed with Dr Franky Machoabbell, they have decline surgery.  Patient AMS, likely related to subdural.  Keppra discontinue, family though it was making him sleepy.  PT, Speech evaluation. Repeating CT head wont change management, discussed with Dr Franky Machoabbell  Today patient was sleepy this am. I came back and check on him in the afternoon and he wake up to voice, say few words. Moves bilateral upper extremities.  Family opted for SNF.  Discussed with Dr Franky Machocabbell, no need for anti seizure medications at this point in time. Ok to discontinue keppra.   Elevation of troponin; Likely related to intracranial bleed.  No further evaluation.   DM; last hemoglobin A1c 9.4 05/2017. Hold levemir. Poor oral intake.  SSI will cover for CBG more than 200.   Paroxysmal atrial fibrillation, diastolic CHF, CAD hx-last echo 2013, EF 65- 70%.  Appears stable. Hold diuretics.   UTI;  Urine culture grew enterococcus.  Now on IV ampicillin.  UA with less WBC.  Will treat for total 7 days.       DVT prophylaxis: none, comfort.  Code Status: DNR Family Communication: no family at bedside.  Disposition Plan: SNF Consultants:   Neurosurgery  palliative  Procedures:    Antimicrobials:   none   Subjective: Today patient was sleepy this am. I came back and check on him in the afternoon and he wake up to voice, say few words. Moves bilateral upper extremities.   Objective: Vitals:   08/04/17 2000 08/04/17 2330 08/05/17 0848 08/05/17 1210  BP: (!) 149/62 (!)  162/65 140/66 (!) 122/56  Pulse: (!) 54 60 (!) 55 (!) 59  Resp: 18 18 20 20   Temp: 98.4 F (36.9 C) 98.2 F (36.8 C) 97.6 F (36.4 C) 97.8 F (36.6 C)  TempSrc: Oral Oral Oral Oral  SpO2: 98% 99% 95% 100%  Weight:      Height:        Intake/Output Summary (Last 24 hours) at 08/05/2017 1600 Last data filed at 08/05/2017 1020 Gross per 24 hour  Intake 661.92 ml  Output 1100 ml  Net -438.08 ml   Filed Weights   07/30/17 1447 07/30/17 2100  Weight: 72.6 kg (160 lb) 63.2 kg (139 lb 5.3 oz)    Examination:  General exam: NAD,  Respiratory system: CTA Cardiovascular system: S 1, S 2 R RR Gastrointestinal system: BS present, soft, nt Central nervous system: open eyes to voice, say few words.  Extremities: No edema     Data Reviewed: I have personally reviewed following labs and imaging studies  CBC: Recent Labs  Lab 07/30/17 1612 08/02/17 0304  WBC 6.7 6.6  NEUTROABS 4.4  --   HGB 14.4 12.6*  HCT 41.9 38.5*  MCV 92.3 94.8  PLT 165 156   Basic Metabolic Panel: Recent Labs  Lab 07/30/17 1612 08/02/17 0304 08/05/17 1322  NA 134* 141 138  K 4.3 4.5 3.9  CL 100 111 111  CO2 25 20* 19*  GLUCOSE 271* 292* 276*  BUN 22 33* 12  CREATININE 1.40* 1.37* 0.87  CALCIUM 8.8* 8.6* 8.0*   GFR: Estimated Creatinine Clearance: 47.4 mL/min (by C-G formula based on SCr of 0.87 mg/dL). Liver Function Tests: Recent Labs  Lab 07/30/17 1612  AST 21  ALT 12  ALKPHOS 66  BILITOT 0.8  PROT 7.9  ALBUMIN 3.5   No results for input(s): LIPASE, AMYLASE in the last 168 hours. Recent Labs  Lab 07/30/17 1612  AMMONIA 18   Coagulation Profile: No results for input(s): INR, PROTIME in the last 168 hours. Cardiac Enzymes: Recent Labs  Lab 07/30/17 1612 07/31/17 0027 07/31/17 0627  TROPONINI 0.10* 0.11* 0.11*   BNP (last 3 results) No results for input(s): PROBNP in the last 8760 hours. HbA1C: No results for input(s): HGBA1C in the last 72 hours. CBG: Recent Labs    Lab 08/04/17 1525 08/04/17 2113 08/05/17 0604 08/05/17 1152 08/05/17 1544  GLUCAP 152* 178* 189* 251* 204*   Lipid Profile: No results for input(s): CHOL, HDL, LDLCALC, TRIG, CHOLHDL, LDLDIRECT in the last 72 hours. Thyroid Function Tests: No results for input(s): TSH, T4TOTAL, FREET4, T3FREE, THYROIDAB in the last 72 hours. Anemia Panel: No results for input(s): VITAMINB12, FOLATE, FERRITIN, TIBC, IRON, RETICCTPCT in the last 72 hours. Sepsis Labs: Recent Labs  Lab 07/30/17 1622  LATICACIDVEN 1.59    Recent Results (from the past 240 hour(s))  Culture, Urine     Status: Abnormal   Collection Time: 07/30/17 10:47 PM  Result Value Ref Range Status   Specimen Description URINE, CLEAN CATCH  Final   Special Requests   Final    NONE Performed at [facility], [address] [place], [place] [address]  Culture >=100,000 COLONIES/mL ENTEROCOCCUS FAECALIS (A)  Final   Report Status 08/02/2017 FINAL  Final   Organism ID, Bacteria ENTEROCOCCUS FAECALIS (A)  Final      Susceptibility   Enterococcus faecalis - MIC*    AMPICILLIN <=2 SENSITIVE Sensitive     LEVOFLOXACIN 0.5 SENSITIVE Sensitive     NITROFURANTOIN <=16 SENSITIVE Sensitive     VANCOMYCIN 1 SENSITIVE Sensitive     * >=100,000 COLONIES/mL ENTEROCOCCUS FAECALIS         Radiology Studies: No results found.      Scheduled Meds: . insulin aspart  0-9 Units Subcutaneous TID WC  . isosorbide mononitrate  30 mg Oral QPC lunch  . mouth rinse  15 mL Mouth Rinse BID  . sodium bicarbonate  650 mg Oral BID   Continuous Infusions: . sodium chloride 50 mL/hr at 08/05/17 1336  . ampicillin (OMNIPEN) IV       LOS: 6 days    Time spent: 35 minutes.     Alba Cory, MD [facility] Pager [phone]  If 7PM-7AM, please contact night-coverage [email or web address] Password [email or web address] 08/05/2017, 4:00 PM

## 2017-08-05 NOTE — [place] FL2 (Signed)
[place] MEDICAID FL2 LEVEL OF CARE SCREENING TOOL     IDENTIFICATION  Patient Name: Levi MedicoBernice L Graham Birthdate: 09/29/1921 Sex: male Admission Date (Current Location): 07/30/2017  [facility] [place]   Facility and Address:  [facility], [address], [place], [place] [address]      Provider Number: [id number]  Attending Physician Name and Address:  Alba Coryegalado, Belkys A, MD  Relative Name and Phone Number:       Current Level of Care: Hospital Recommended Level of Care: Skilled Nursing Facility Prior Approval Number:    Date Approved/Denied:   PASRR Number: [id number]  Discharge Plan: SNF    Current Diagnoses: Patient Active Problem List   Diagnosis Date Noted  . Palliative care patient   . PAF (paroxysmal atrial fibrillation) (HCC)   . Benign essential HTN   . Stage 3 chronic kidney disease (HCC)   . Atrial fibrillation with RVR (HCC) 06/05/2017  . Subdural hematoma, acute (HCC) 06/05/2017  . Altered mental status 06/05/2017  . Acute CHF (congestive heart failure) (HCC) 06/05/2017  . Fall 06/05/2017  . Uncontrolled diabetes mellitus (HCC) 06/05/2017  . DNR (do not resuscitate) 06/05/2017  . Subdural hematoma (HCC) 06/05/2017  . Occlusion and stenosis of carotid artery without mention of cerebral infarction 08/04/2011  . Hypertension   . CAD (coronary artery disease)   . Carotid arterial disease (HCC)   . Anemia   . Posterior vitreous detachment 06/05/2011  . Status post intraocular lens implant 06/05/2011  . Branch retinal vein occlusion 12/12/2010  . Nonproliferative diabetic retinopathy (HCC) 12/12/2010    Orientation RESPIRATION BLADDER Height & Weight     Self  Normal Incontinent Weight: 139 lb 5.3 oz (63.2 kg) Height:  5\' 10"  (177.8 cm)  BEHAVIORAL SYMPTOMS/MOOD NEUROLOGICAL BOWEL NUTRITION STATUS      Incontinent Diet  AMBULATORY STATUS COMMUNICATION OF NEEDS Skin   Total Care Does not communicate Skin  abrasions(abrasions on legs)                       Personal Care Assistance Level of Assistance  Bathing, Feeding, Dressing Bathing Assistance: Maximum assistance Feeding assistance: Maximum assistance Dressing Assistance: Maximum assistance     Functional Limitations Info  Sight, Hearing, Speech Sight Info: Impaired Hearing Info: Impaired Speech Info: Impaired    SPECIAL CARE FACTORS FREQUENCY  OT (By licensed OT), Speech therapy, PT (By licensed PT)     PT Frequency: 5x/wk OT Frequency: 5x/wk     Speech Therapy Frequency: 5x/wk      Contractures Contractures Info: Not present    Additional Factors Info  Code Status, Allergies, Insulin Sliding Scale Code Status Info: DNR Allergies Info: Ativan Lorazepam, Fentanyl   Insulin Sliding Scale Info: 0-9 units 3x/day with meals       Current Medications (08/05/2017):  This is the current hospital active medication list Current Facility-Administered Medications  Medication Dose Route Frequency Provider Last Rate Last Dose  . 0.9 %  sodium chloride infusion   Intravenous Continuous Regalado, Belkys A, MD 50 mL/hr at 08/04/17 1516    . acetaminophen (TYLENOL) tablet 650 mg  650 mg Oral Q6H PRN Emokpae, Ejiroghene E, MD       Or  . acetaminophen (TYLENOL) suppository 650 mg  650 mg Rectal Q6H PRN Emokpae, Ejiroghene E, MD      . ampicillin (OMNIPEN) 1 g in sodium chloride 0.9 % 100 mL IVPB  1 g Intravenous Q8H  Regalado, Belkys A, MD 300 mL/hr at 08/05/17 0829 1 g at 08/05/17 0829  . insulin aspart (novoLOG) injection 0-9 Units  0-9 Units Subcutaneous TID WC Regalado, Belkys A, MD   2 Units at 08/04/17 1217  . isosorbide mononitrate (IMDUR) 24 hr tablet 30 mg  30 mg Oral QPC lunch Regalado, Belkys A, MD   30 mg at 08/03/17 1235  . MEDLINE mouth rinse  15 mL Mouth Rinse BID Emokpae, Ejiroghene E, MD   15 mL at 08/04/17 2257  . ondansetron (ZOFRAN) tablet 4 mg  4 mg Oral Q6H PRN Emokpae, Ejiroghene E, MD       Or  .  ondansetron (ZOFRAN) injection 4 mg  4 mg Intravenous Q6H PRN Emokpae, Ejiroghene E, MD      . polyethylene glycol (MIRALAX / GLYCOLAX) packet 17 g  17 g Oral Daily PRN Emokpae, Ejiroghene E, MD         Discharge Medications: Please see discharge summary for a list of discharge medications.  Relevant Imaging Results:  Relevant Lab Results:   Additional Information SS#: [id number]  Baldemar Lenis, LCSW

## 2017-08-05 NOTE — Progress Notes (Signed)
SLP notified by PTA, OT that they are continuing services per family request.  SLP signed off on patient yesterday as per review of palliative note - focus is on comfort for this pt.  If MD desires SLP to educate re: chronic aspiration risk and mitigation strategies please reorder.  Unfortunately no easy options for patient and he is currently on a modified diet = dys3/thin diet.  Thanks.     Donavan Burnetamara Eavan Gonterman, MS [facility] SLP [phone]

## 2017-08-05 NOTE — Progress Notes (Signed)
Daily Progress Note   Patient Name: Levi Robinson       Date: 08/05/2017 DOB: 06/25/1921  Age: 84 y.o. MRN#: [id number] Attending Physician: Levi Coryegalado, Belkys A, MD Primary Care Physician: Levi Fickamachandran, Ajith, MD Admit Date: 07/30/2017  Reason for Consultation/Follow-up: Establishing goals of care  Subjective: Patient is non-responsive.   I spoke with Levi Robinson on the phone.  She reports he ate all of his potatoes and green beans for her last night.  She states that she knew her grand mother was dying when she would no longer eat at all even with family encouragement.  We talked about the cycle of being in the hospital and receiving IV fluids then dehydrating after discharge.  I explained that if this happens Robinson couple of times then we realize it is not going to get better and the person simply can not take in enough nutrition / hydration to support themselves.   Levi Robinson understood and agreed.  She asks for the social worker to call her.  She's evaluating SNF facilities.  She would like for him to receive as much physical therapy support as possible.   Assessment: Patient very lethargic and pleasant.  Seems to respond for family.  Large SDH with midline shift of 8 mm.  UTI has cleared.   Patient Profile/HPI:  84 y.o. male, "BB", with past medical history of fall and SDH in late May 2019, paroxysmal afib/aflutter, CAD, and brittle diabetes who was admitted on 07/30/2017 with altered mental status.  ER work up revealed enlargement of the previous SDH including an 8 mm midline shift. (in May midline shift was 3 mm) as well as Robinson UTI.  Family has declined any surgical procedures.   Length of Stay: 6  Current Medications: Scheduled Meds:  . insulin aspart  0-9 Units Subcutaneous TID WC  . isosorbide  mononitrate  30 mg Oral QPC lunch  . mouth rinse  15 mL Mouth Rinse BID    Continuous Infusions: . sodium chloride 50 mL/hr at 08/04/17 1516  . ampicillin (OMNIPEN) IV 1 g (08/05/17 0829)    PRN Meds: acetaminophen **OR** acetaminophen, ondansetron **OR** ondansetron (ZOFRAN) IV, polyethylene glycol  Physical Exam        Well developed elderly 84 yo male.  Lethargic Minimally responsive to my voice and touch. CV rrr with systolic  murmur resp no w/c/r no distress Abdomen soft, nt, nd  Vital Signs: BP 140/66 (BP Location: Left Arm)   Pulse (!) 55   Temp 97.6 F (36.4 C) (Oral)   Resp 20   Ht 5\' 10"  (1.778 m)   Wt 63.2 kg (139 lb 5.3 oz)   SpO2 95%   BMI 19.99 kg/m  SpO2: SpO2: 95 % O2 Device: O2 Device: Room Air O2 Flow Rate:    Intake/output summary:   Intake/Output Summary (Last 24 hours) at 08/05/2017 1123 Last data filed at 08/05/2017 1020 Gross per 24 hour  Intake 761.92 ml  Output 1400 ml  Net -638.08 ml   LBM: Last BM Date: 08/04/17 Baseline Weight: Weight: 72.6 kg (160 lb) Most recent weight: Weight: 63.2 kg (139 lb 5.3 oz)       Palliative Assessment/Data: 20-30%    Flowsheet Rows     Most Recent Value  Intake Tab  Referral Department  Hospitalist  Unit at Time of Referral  Med/Surg Unit  Palliative Care Primary Diagnosis  Neurology  Date Notified  07/31/17  Palliative Care Type  New Palliative care  Reason for referral  Clarify Goals of Care, Non-pain Symptom  Date of Admission  07/30/17  Date first seen by Palliative Care  07/31/17  # of days Palliative referral response time  0 Day(s)  # of days IP prior to Palliative referral  1  Clinical Assessment  Psychosocial & Spiritual Assessment  Palliative Care Outcomes      Patient Active Problem List   Diagnosis Date Noted  . Palliative care patient   . PAF (paroxysmal atrial fibrillation) (HCC)   . Benign essential HTN   . Stage 3 chronic kidney disease (HCC)   . Atrial fibrillation with  RVR (HCC) 06/05/2017  . Subdural hematoma, acute (HCC) 06/05/2017  . Altered mental status 06/05/2017  . Acute CHF (congestive heart failure) (HCC) 06/05/2017  . Fall 06/05/2017  . Uncontrolled diabetes mellitus (HCC) 06/05/2017  . DNR (do not resuscitate) 06/05/2017  . Subdural hematoma (HCC) 06/05/2017  . Occlusion and stenosis of carotid artery without mention of cerebral infarction 08/04/2011  . Hypertension   . CAD (coronary artery disease)   . Carotid arterial disease (HCC)   . Anemia   . Posterior vitreous detachment 06/05/2011  . Status post intraocular lens implant 06/05/2011  . Branch retinal vein occlusion 12/12/2010  . Nonproliferative diabetic retinopathy (HCC) 12/12/2010    Palliative Care Plan    Recommendations/Plan:  I believe patient is eligible for hospice house but that is not in line with grand daughter's goals.    Based on her experiences with her grandmother he may have significant time left.  He is minimally responsive and eating minimal amounts, but appears comfortable.  D/C to SNF for acute rehab with Palliative to follow.   Code Status:  DNR  Prognosis:  With invasive medical care (IV Fluids, rehospitalization) the patient may have months   With full comfort care (allowing the patient to eat/drink on his own with no artificial intervention) he likely has 1-3 weeks.  Discharge Planning:  Skilled Nursing Facility for rehab with Palliative care service follow-up  Care plan was discussed with [name] daughter, Levi Robinson  Thank you for allowing the Palliative Medicine Team to assist in the care of this patient.  Total time spent:  35 min.     Greater than 50%  of this time was spent counseling and coordinating care related to the above assessment and  plan.  Levi Richards, PA-C Palliative Medicine  Please contact Palliative MedicineTeam phone at [phone] for questions and concerns between 7 am - 7 pm.   Please see AMION for  individual provider pager numbers.

## 2017-08-05 NOTE — Progress Notes (Signed)
Occupational Therapy Treatment Patient Details Name: Levi Robinson MRN: [id number] DOB: 11/05/1921 Today's Date: 08/05/2017    History of present illness Patient is a 84 y/o male initially presenting to the ED on 07/30/17 due to AMS. Of note, recent hospital admission 5/24- 06/09/17 after a fall at home with CT head showing significant right subdural hematoma with right greater than left shift. Most recent MRI revealing Enlarging right acute on chronic subdural hematoma, now measuring 2.8 cm in maximal diameter, previously 2.1 cm. Resultant increased right-to-left midline shift, now measuring 8 mm, previously 3 mm. PMH significant for DM,  HTN, recent acute subdural hematoma, carotid and coronary  disease, CHF, paroxysmal atrial fibrillation.   OT comments  Pt requires total (A) for all adls and benefits from upright posture with HOB elevated for arousal for attempts at meals. Pt sitting EOB for attempts at PO intake this session. Pt noted to have two very strong throat clearings with EOB sitting. Pt with flat affect and no verbalizations this session. Pt with RN at bedside assessing at the end of session.   Follow Up Recommendations  SNF;Supervision/Assistance - 24 hour    Equipment Recommendations  Other (comment)    Recommendations for Other Services PT consult    Precautions / Restrictions Precautions Precautions: Fall Restrictions Weight Bearing Restrictions: No       Mobility Bed Mobility Overal bed mobility: Needs Assistance Bed Mobility: Supine to Sit;Sit to Supine     Supine to sit: +2 for physical assistance;Total assist Sit to supine: +2 for physical assistance;Total assist   General bed mobility comments: pt requires assistance for all aspects of bed mobility   Transfers                      Balance                                           ADL either performed or assessed with clinical judgement   ADL Overall ADL's : Needs  assistance/impaired Eating/Feeding: Total assistance Eating/Feeding Details (indicate cue type and reason): pt taking 5 bites of yogurt but requires total (A) to self feed                                         Vision       Perception     Praxis      Cognition Arousal/Alertness: Lethargic Behavior During Therapy: Flat affect Overall Cognitive Status: No family/caregiver present to determine baseline cognitive functioning                                 General Comments: pt did not attempt to verbalize during session; pt did keep eyes open most of session while mobilizing and tracks visually  pt did visually track therapist with name call "BB". pt with minimal responses in general. pt does not follow simple commands.         Exercises     Shoulder Instructions       General Comments VSS noted to show orthostatic SBP with 40 point drop. ( SEE vitals for values by PTA) pt with MAP 96 dropping to 70s.     Pertinent Vitals/ Pain  Pain Assessment: Faces Faces Pain Scale: Hurts even more Pain Location: R UE (seemed to subside with doffing of splint) Pain Descriptors / Indicators: Grimacing Pain Intervention(s): Monitored during session;Repositioned  Home Living                                          Prior Functioning/Environment              Frequency  Min 1X/week        Progress Toward Goals  OT Goals(current goals can now be found in the care plan section)  Progress towards OT goals: Not progressing toward goals - comment  Acute Rehab OT Goals Patient Stated Goal: none stated OT Goal Formulation: Patient unable to participate in goal setting Time For Goal Achievement: 08/16/17 Potential to Achieve Goals: Good ADL Goals Pt Will Perform Grooming: with mod assist;sitting Pt Will Perform Upper Body Dressing: with mod assist;sitting Pt Will Transfer to Toilet: ambulating;with min assist;regular height  toilet Pt Will Perform Toileting - Clothing Manipulation and hygiene: with supervision;sit to/from stand Pt/caregiver will Perform Home Exercise Program: Increased ROM;Increased strength;Both right and left upper extremity;With minimal assist Additional ADL Goal #1: Caregiver will demonstrate understanding of PROM strentches for BUEs with supervision Additional ADL Goal #2: Pt will complete bed mobility with overall supervision to sit at EOB in preparation for ADL participation.  Plan Discharge plan remains appropriate    Co-evaluation    PT/OT/SLP Co-Evaluation/Treatment: Yes Reason for Co-Treatment: Complexity of the patient's impairments (multi-system involvement);Necessary to address cognition/behavior during functional activity;For patient/therapist safety;To address functional/ADL transfers PT goals addressed during session: Mobility/safety with mobility OT goals addressed during session: ADL's and self-care;Proper use of Adaptive equipment and DME;Strengthening/ROM      AM-PAC PT "6 Clicks" Daily Activity     Outcome Measure   Help from another person eating meals?: Total Help from another person taking care of personal grooming?: Total Help from another person toileting, which includes using toliet, bedpan, or urinal?: Total Help from another person bathing (including washing, rinsing, drying)?: Total Help from another person to put on and taking off regular upper body clothing?: Total Help from another person to put on and taking off regular lower body clothing?: Total 6 Click Score: 6    End of Session    OT Visit Diagnosis: Other abnormalities of gait and mobility (R26.89);Muscle weakness (generalized) (M62.81);Other symptoms and signs involving cognitive function   Activity Tolerance Patient tolerated treatment well   Patient Left in bed;with call bell/phone within reach;with nursing/sitter in room;with SCD's reapplied   Nurse Communication Mobility  status;Precautions;Need for lift equipment        Time: 0930-1000 OT Time Calculation (min): 30 min  Charges: OT General Charges $OT Visit: 1 Visit OT Treatments $Self Care/Home Management : 8-22 mins   Mateo Flow   OTR/L Pager: [phone] Office: [phone] .    Levi Robinson 08/05/2017, 2:26 PM

## 2017-08-06 LAB — GLUCOSE, CAPILLARY
GLUCOSE-CAPILLARY: 241 mg/dL — AB (ref 70–99)
GLUCOSE-CAPILLARY: 180 mg/dL — AB (ref 70–99)
Glucose-Capillary: 232 mg/dL — ABNORMAL HIGH (ref 70–99)
Glucose-Capillary: 226 mg/dL — ABNORMAL HIGH (ref 70–99)

## 2017-08-06 NOTE — Progress Notes (Addendum)
PROGRESS NOTE    Levi Robinson  JYN:[id number] DOB: 07/16/1921 DOA: 07/30/2017 PCP: Georgianne Fickamachandran, Ajith, MD    Brief Narrative: Levi Robinson is a 84 y.o. male with medical history significant for DM,  HTN, recent acute subdural hematoma, carotid and coronary  disease, CHF, paroxysmal atrial fibrillation.  Patient is asleep, arouses to voice, but appears lethargic .  History is obtained from family- 2 granddaughters who are at bedside. Patient was brought to the ED by family with complaints of altered mental status.  Over the past 5 days patient has physically and cognitively declined, not responding to questions as before, has not been able to ambulate, or feed himself.    Recent hospital admission 5/24- 06/09/17 after a fall at home with CT head showing significant right subdural hematoma with right greater than left shift.  And initially obtunded, improved.  Family declined any invasive procedures.  Patient was discharged to home.  Since discharge patient's granddaughter, who patient resides with, reports gradual decline in mental status that would improve, but report he has not being this weak for this long.  Patient was previously able to use his walker, not ambulates with wheelchair.  Family reports reduced appetite.  No nausea vomiting or loose stools.  ED Course: Mild intermittent tachypnea to 23, systolic blood pressure 93 improved to 140s.  WBC 6.7.  Troponin mildly elevated 0.1.  Creatinine 1.4 improved compared to baseline.  EKG sinus rhythm.  UA-moderate leukocytes, many bacteria.  Head CT-large and right acute on chronic subdural hematoma now measuring 2.8 cm in maximum diameter previously 2.1 cm.  Resultant increased right-to-left midline shift now measuring 8 mm previously 3 mm.  Neurosurgery was consulted in the ED. Granddaughters- [name] (HCPOA) and [name]-talked to Dr. Franky Machoabbell on the phone.  Family still declined surgery. Palliative care consulted in the ED-follow as inpatient.    Admitted with acute encephalopathy, worsening mental status. He was found to have UTI, enterococcus UTI and acute on chronic subdural hematoma, measuring 2.8 cm and previously 2.1. Increased right to left midline shift.  Family spoke with Dr Franky Machoabbell, family decline surgery. Palliative was consulted. Palliative spoke with POA, POA wants to try SNF for Rehab.    Assessment & Plan:   Principal Problem:   Altered mental status Active Problems:   CAD (coronary artery disease)   Carotid arterial disease (HCC)   Nonproliferative diabetic retinopathy (HCC)   Atrial fibrillation with RVR (HCC)   Subdural hematoma, acute (HCC)   Uncontrolled diabetes mellitus (HCC)   DNR (do not resuscitate)   Benign essential HTN   Palliative care patient  1-Subdural Hematoma; Acute encephalopathy metabolic vs toxic, vs related to subdural , AMS Family discussed with Dr Franky Machoabbell, they have decline surgery.  Patient AMS, likely related to subdural.  Keppra discontinue, family though it was making him sleepy.  PT, Speech evaluation. Repeating CT head wont change management, discussed with Dr Franky Machoabbell  Today patient was sleepy this am. I came back and check on him in the afternoon and he wake up to voice, say few words. Moves bilateral upper extremities.  Family opted for SNF.  Family was concern that keppra was making him more lethargic. Keppra discontinue,. Discussed with Dr Franky Machocabbell, no need for anti seizure medications at this point in time. Ok to discontinue keppra.   Elevation of troponin; Likely related to intracranial bleed.  No further evaluation.   DM; last hemoglobin A1c 9.4 05/2017. Hold levemir. Poor oral intake.  SSI will cover for CBG  more than 200.   Paroxysmal atrial fibrillation, diastolic CHF, CAD hx-last echo 2013, EF 65- 70%.  Appears stable. Hold diuretics.   UTI;  Urine culture grew enterococcus.  Now on IV ampicillin.  UA with less WBC.  Will treat for total 7 days. Day 6-7 of  antibiotics.    Chronic diastolic HF; compensated   DVT prophylaxis: none, comfort.  Code Status: DNR Family Communication: no family at bedside.  Disposition Plan: Awaiting SNF Consultants:   Neurosurgery  palliative   Procedures:    Antimicrobials:   none   Subjective: Per nurse he ate breakfast today.  On my evaluation, he wake up say few words, willing to try lunch,   Objective: Vitals:   08/06/17 0000 08/06/17 0421 08/06/17 0914 08/06/17 1136  BP: 125/60 (!) 120/55 (!) 119/58 (!) 126/57  Pulse: 69 62 65 64  Resp: 16 16 16 16   Temp: 97.6 F (36.4 C) 97.6 F (36.4 C) (!) 97.5 F (36.4 C) 97.6 F (36.4 C)  TempSrc: Oral Oral Oral Oral  SpO2: 98% 99% 98% 96%  Weight:      Height:        Intake/Output Summary (Last 24 hours) at 08/06/2017 1454 Last data filed at 08/06/2017 0918 Gross per 24 hour  Intake 2217.09 ml  Output 602 ml  Net 1615.09 ml   Filed Weights   07/30/17 1447 07/30/17 2100  Weight: 72.6 kg (160 lb) 63.2 kg (139 lb 5.3 oz)    Examination:  General exam: NAD.  Respiratory system: CTA Cardiovascular system: S 1, S 2 RRR Gastrointestinal system: BS present, soft, nt Central nervous system: open eyes, say few words,  Extremities: No edema     Data Reviewed: I have personally reviewed following labs and imaging studies  CBC: Recent Labs  Lab 07/30/17 1612 08/02/17 0304  WBC 6.7 6.6  NEUTROABS 4.4  --   HGB 14.4 12.6*  HCT 41.9 38.5*  MCV 92.3 94.8  PLT 165 156   Basic Metabolic Panel: Recent Labs  Lab 07/30/17 1612 08/02/17 0304 08/05/17 1322  NA 134* 141 138  K 4.3 4.5 3.9  CL 100 111 111  CO2 25 20* 19*  GLUCOSE 271* 292* 276*  BUN 22 33* 12  CREATININE 1.40* 1.37* 0.87  CALCIUM 8.8* 8.6* 8.0*   GFR: Estimated Creatinine Clearance: 47.4 mL/min (by C-G formula based on SCr of 0.87 mg/dL). Liver Function Tests: Recent Labs  Lab 07/30/17 1612  AST 21  ALT 12  ALKPHOS 66  BILITOT 0.8  PROT 7.9    ALBUMIN 3.5   No results for input(s): LIPASE, AMYLASE in the last 168 hours. Recent Labs  Lab 07/30/17 1612  AMMONIA 18   Coagulation Profile: No results for input(s): INR, PROTIME in the last 168 hours. Cardiac Enzymes: Recent Labs  Lab 07/30/17 1612 07/31/17 0027 07/31/17 0627  TROPONINI 0.10* 0.11* 0.11*   BNP (last 3 results) No results for input(s): PROBNP in the last 8760 hours. HbA1C: No results for input(s): HGBA1C in the last 72 hours. CBG: Recent Labs  Lab 08/05/17 1152 08/05/17 1544 08/05/17 2152 08/06/17 0628 08/06/17 1134  GLUCAP 251* 204* 201* 232* 241*   Lipid Profile: No results for input(s): CHOL, HDL, LDLCALC, TRIG, CHOLHDL, LDLDIRECT in the last 72 hours. Thyroid Function Tests: No results for input(s): TSH, T4TOTAL, FREET4, T3FREE, THYROIDAB in the last 72 hours. Anemia Panel: No results for input(s): VITAMINB12, FOLATE, FERRITIN, TIBC, IRON, RETICCTPCT in the last 72 hours. Sepsis Labs: Recent  Labs  Lab 07/30/17 1622  LATICACIDVEN 1.59    Recent Results (from the past 240 hour(s))  Culture, Urine     Status: Abnormal   Collection Time: 07/30/17 10:47 PM  Result Value Ref Range Status   Specimen Description URINE, CLEAN CATCH  Final   Special Requests   Final    NONE Performed at [facility], [address], [place], [place] [address]    Culture >=100,000 COLONIES/mL ENTEROCOCCUS FAECALIS (A)  Final   Report Status 08/02/2017 FINAL  Final   Organism ID, Bacteria ENTEROCOCCUS FAECALIS (A)  Final      Susceptibility   Enterococcus faecalis - MIC*    AMPICILLIN <=2 SENSITIVE Sensitive     LEVOFLOXACIN 0.5 SENSITIVE Sensitive     NITROFURANTOIN <=16 SENSITIVE Sensitive     VANCOMYCIN 1 SENSITIVE Sensitive     * >=100,000 COLONIES/mL ENTEROCOCCUS FAECALIS         Radiology Studies: No results found.      Scheduled Meds: . insulin aspart  0-9 Units Subcutaneous TID WC  . isosorbide mononitrate  30 mg Oral QPC lunch   . mouth rinse  15 mL Mouth Rinse BID  . sodium bicarbonate  650 mg Oral BID   Continuous Infusions: . sodium chloride 50 mL/hr at 08/06/17 1143  . ampicillin (OMNIPEN) IV 1 g (08/06/17 1145)     LOS: 7 days    Time spent: 35 minutes.     Alba Cory, MD [facility] Pager [phone]  If 7PM-7AM, please contact night-coverage [email or web address] Password [facility] 08/06/2017, 2:54 PM

## 2017-08-07 LAB — GLUCOSE, CAPILLARY
GLUCOSE-CAPILLARY: 241 mg/dL — AB (ref 70–99)
Glucose-Capillary: 247 mg/dL — ABNORMAL HIGH (ref 70–99)
Glucose-Capillary: 238 mg/dL — ABNORMAL HIGH (ref 70–99)
Glucose-Capillary: 256 mg/dL — ABNORMAL HIGH (ref 70–99)

## 2017-08-07 MED ORDER — INSULIN GLARGINE 100 UNIT/ML ~~LOC~~ SOLN
10.0000 [IU] | Freq: Every day | SUBCUTANEOUS | Status: DC
  Administered 2017-08-07 – 2017-08-11 (×5): 10 [IU] via SUBCUTANEOUS
  Filled 2017-08-07 (×5): qty 0.1

## 2017-08-07 NOTE — Progress Notes (Addendum)
PROGRESS NOTE    Levi Robinson  WJX:[id number] DOB: 05/22/1921 DOA: 07/30/2017 PCP: Georgianne Fickamachandran, Ajith, MD   Brief Narrative: Patient is a 84 year old male with past medical history of diabetes mellitus, hypertension, recent acute subdural hematoma, coronary artery disease, CHF, paroxysmal A. fib who was brought to the emergency department by family with complaints of altered mental status.  Patient had been reported to have physically and cognitively declined, not able to ambulate or feed himself.  He had recent hospitalization from 5/24 to 5/28 after a fall at home during which CT head showed significant  subdural hematoma with midline shift.  Patient was discharged to home after that.  CT head done on this admission showed increased size of subdural hematoma   and increased midline shift.  Neurosurgery was consulted by the emergency department.  No intervention was plan after discussion with family.  Palliative care also following.  Currently he is waiting for skilled nursing facility bed but looks like this will be lengthy process because insurance denied.  Current plan is to discharge him to skilled nursing facility under [facility].If not ,he should be taken to home by family. Child psychotherapistsocial worker, case Production designer, theatre/television/filmmanager following.  Assessment & Plan:   Principal Problem:   Altered mental status Active Problems:   CAD (coronary artery disease)   Carotid arterial disease (HCC)   Nonproliferative diabetic retinopathy (HCC)   Atrial fibrillation with RVR (HCC)   Subdural hematoma, acute (HCC)   Uncontrolled diabetes mellitus (HCC)   DNR (do not resuscitate)   Benign essential HTN   Palliative care patient  Acute encephalopathy/subdural hematoma/altered mental status: Neurosurgery was following.  No plan for intervention after discussion with family.  His mental status has significantly improved and currently he is on baseline.  He was initially started on Keppra which has been discontinued.  We will continue to  monitor his mental status.  No need to repeat head CT after discussion with neurosurgery.    Urinary tract infection: Urine culture grew enterococcus.  Completed the course of antibiotic with ampicillin.Currently he is afebrile.  White cell count stable.  Elevated troponin: Troponin remained flat.  No complaints of chest pain.  Will not further investigate.  History of DM: Last hemoglobin A1c was 9.4 on  5/19.  Continue Lantus and sliding scale insulin.  Deconditioning/debility:Physical therapy evaluated the patient and recommended a skilled nursing facility.  Advanced age/multiple comorbidities: Palliative care was following here.  Palliative care will follow him at the skilled nursing facility after discharge.   DVT prophylaxis: SCD Code Status: DNR Family Communication: Discussed with granddaughter on phone Disposition Plan: Skilled nursing facility under [facility].  Social Investment banker, operationalworker/case manager following.  Patient is medically stable for discharge to SNF (or home if SNF not  possible).  Consultants: Neuro surgery  Procedures: None  Antimicrobials: None  Subjective: Patient seen and examined the bedside this morning.  Looks comfortable.  Is alert.  Not in any kind of distress.  Eating breakfast  Objective: Vitals:   08/06/17 2351 08/07/17 0549 08/07/17 0819 08/07/17 1106  BP: (!) 155/80 (!) 154/77 (!) 116/50 (!) 125/59  Pulse: 75 79 65 66  Resp: 16 16 15 15   Temp: 98.6 F (37 C) 98.3 F (36.8 C) 98.3 F (36.8 C) 98.5 F (36.9 C)  TempSrc: Oral Oral Axillary Oral  SpO2: 95% 98% 93% 100%  Weight:      Height:        Intake/Output Summary (Last 24 hours) at 08/07/2017 1320 Last data filed at  08/07/2017 0955 Gross per 24 hour  Intake 590 ml  Output 900 ml  Net -310 ml   Filed Weights   07/30/17 1447 07/30/17 2100  Weight: 72.6 kg (160 lb) 63.2 kg (139 lb 5.3 oz)    Examination:  General exam: Appears calm and comfortable ,Not in distress,average built HEENT:PERRL,Oral  mucosa moist, Ear/Nose normal on gross exam Respiratory system: Bilateral equal air entry, normal vesicular breath sounds, no wheezes or crackles  Cardiovascular system: S1 & S2 heard, RRR. No JVD, murmurs, rubs, gallops or clicks. No pedal edema. Gastrointestinal system: Abdomen is nondistended, soft and nontender. No organomegaly or masses felt. Normal bowel sounds heard. Central nervous system: Alert but oriented to self only. Extremities: Mild edema of bilateral upper extremities, no clubbing ,no cyanosis, distal peripheral pulses palpable. Skin: No ulcers,no icterus ,no pallor.  Ecchymosis on the skin MSK: Normal muscle bulk,tone ,power Psychiatry: Judgement and insight appear impaired   Data Reviewed: I have personally reviewed following labs and imaging studies  CBC: Recent Labs  Lab 08/02/17 0304  WBC 6.6  HGB 12.6*  HCT 38.5*  MCV 94.8  PLT 156   Basic Metabolic Panel: Recent Labs  Lab 08/02/17 0304 08/05/17 1322  NA 141 138  K 4.5 3.9  CL 111 111  CO2 20* 19*  GLUCOSE 292* 276*  BUN 33* 12  CREATININE 1.37* 0.87  CALCIUM 8.6* 8.0*   GFR: Estimated Creatinine Clearance: 47.4 mL/min (by C-G formula based on SCr of 0.87 mg/dL). Liver Function Tests: No results for input(s): AST, ALT, ALKPHOS, BILITOT, PROT, ALBUMIN in the last 168 hours. No results for input(s): LIPASE, AMYLASE in the last 168 hours. No results for input(s): AMMONIA in the last 168 hours. Coagulation Profile: No results for input(s): INR, PROTIME in the last 168 hours. Cardiac Enzymes: No results for input(s): CKTOTAL, CKMB, CKMBINDEX, TROPONINI in the last 168 hours. BNP (last 3 results) No results for input(s): PROBNP in the last 8760 hours. HbA1C: No results for input(s): HGBA1C in the last 72 hours. CBG: Recent Labs  Lab 08/06/17 1134 08/06/17 1635 08/06/17 2252 08/07/17 0632 08/07/17 1127  GLUCAP 241* 180* 226* 247* 238*   Lipid Profile: No results for input(s): CHOL, HDL,  LDLCALC, TRIG, CHOLHDL, LDLDIRECT in the last 72 hours. Thyroid Function Tests: No results for input(s): TSH, T4TOTAL, FREET4, T3FREE, THYROIDAB in the last 72 hours. Anemia Panel: No results for input(s): VITAMINB12, FOLATE, FERRITIN, TIBC, IRON, RETICCTPCT in the last 72 hours. Sepsis Labs: No results for input(s): PROCALCITON, LATICACIDVEN in the last 168 hours.  Recent Results (from the past 240 hour(s))  Culture, Urine     Status: Abnormal   Collection Time: 07/30/17 10:47 PM  Result Value Ref Range Status   Specimen Description URINE, CLEAN CATCH  Final   Special Requests   Final    NONE Performed at [facility], [address] [place], [place] [address]    Culture >=100,000 COLONIES/mL ENTEROCOCCUS FAECALIS (A)  Final   Report Status 08/02/2017 FINAL  Final   Organism ID, Bacteria ENTEROCOCCUS FAECALIS (A)  Final      Susceptibility   Enterococcus faecalis - MIC*    AMPICILLIN <=2 SENSITIVE Sensitive     LEVOFLOXACIN 0.5 SENSITIVE Sensitive     NITROFURANTOIN <=16 SENSITIVE Sensitive     VANCOMYCIN 1 SENSITIVE Sensitive     * >=100,000 COLONIES/mL ENTEROCOCCUS FAECALIS         Radiology Studies: No results found.  Scheduled Meds: . insulin aspart  0-9 Units Subcutaneous TID WC  . insulin glargine  10 Units Subcutaneous QHS  . isosorbide mononitrate  30 mg Oral QPC lunch  . mouth rinse  15 mL Mouth Rinse BID   Continuous Infusions:   LOS: 8 days    Time spent: 25 mins     Burnadette Pop, MD [facility] Pager [phone]  If 7PM-7AM, please contact night-coverage [email or web address] Password [email or web address] 08/07/2017, 1:20 PM

## 2017-08-07 NOTE — Progress Notes (Signed)
CSW received call from patient's granddaughter about [facility]. [name] stated she had visited [name] but they do not have any [facility] beds available. CSW followed up with [name], Child psychotherapistsocial worker at the [facility], and the request for placement has not yet been approved through the [facility]. The [facility] will not look at the request until Monday.  CSW contacted VA contracted SNFs to check on available beds in the meantime:  [name]: [facility] beds [name], sent referral; awaiting response [name]: [name] Not accepting [facility] for [facility] at this time  CSW to continue to follow.  Blenda NicelyElizabeth Gabor Robinson, [place] Clinical Social Worker [phone]

## 2017-08-07 NOTE — Plan of Care (Signed)
  Problem: Nutrition: Goal: Adequate nutrition will be maintained Outcome: Progressing   

## 2017-08-07 NOTE — Progress Notes (Signed)
Physical Therapy Treatment Patient Details Name: Levi Robinson MRN: [id number] DOB: 11/17/1921 Today's Date: 08/07/2017    History of Present Illness Patient is a 84 y/o male initially presenting to the ED on 07/30/17 due to AMS. Of note, recent hospital admission 5/24- 06/09/17 after a fall at home with CT head showing significant right subdural hematoma with right greater than left shift. Most recent MRI revealing Enlarging right acute on chronic subdural hematoma, now measuring 2.8 cm in maximal diameter, previously 2.1 cm. Resultant increased right-to-left midline shift, now measuring 8 mm, previously 3 mm. PMH significant for DM,  HTN, recent acute subdural hematoma, carotid and coronary  disease, CHF, paroxysmal atrial fibrillation.    PT Comments    PT session today focusing on improving functional mobility. Continues to require Total A +2 for all aspects of bed mobility and transfers. Attempted sit to stand today at bedside, with little success as patient demonstrates reduced weight bearing through LE.  Will continue to progress as tolerated.   Follow Up Recommendations  SNF;Supervision/Assistance - 24 hour     Equipment Recommendations  None recommended by PT    Recommendations for Other Services       Precautions / Restrictions Precautions Precautions: Fall Restrictions Weight Bearing Restrictions: No    Mobility  Bed Mobility Overal bed mobility: Needs Assistance Bed Mobility: Supine to Sit;Sit to Supine     Supine to sit: Total assist;+2 for physical assistance Sit to supine: Total assist;+2 for physical assistance   General bed mobility comments: total A for all bed mobility  Transfers Overall transfer level: Needs assistance Equipment used: 2 person hand held assist Transfers: Sit to/from Stand Sit to Stand: Total assist;+2 physical assistance         General transfer comment: attmepted sit to stand at bedside with Total A +2 - little weight bearing  through feet  Ambulation/Gait                 Stairs             Wheelchair Mobility    Modified Rankin (Stroke Patients Only)       Balance Overall balance assessment: Needs assistance Sitting-balance support: Bilateral upper extremity supported;Feet supported Sitting balance-Leahy Scale: Poor                                      Cognition Arousal/Alertness: Lethargic Behavior During Therapy: Flat affect Overall Cognitive Status: No family/caregiver present to determine baseline cognitive functioning Area of Impairment: Following commands;Safety/judgement;Problem solving                       Following Commands: Follows one step commands inconsistently;Follows one step commands with increased time Safety/Judgement: Decreased awareness of safety   Problem Solving: Slow processing;Decreased initiation;Difficulty sequencing;Requires verbal cues;Requires tactile cues General Comments: very little verbalizations during treatment. lethargic with sitting, more alert when returning to supine position      Exercises      General Comments        Pertinent Vitals/Pain Pain Assessment: Faces Faces Pain Scale: No hurt    Home Living                      Prior Function            PT Goals (current goals can now be found in the care plan section) Acute Rehab  PT Goals Patient Stated Goal: none stated PT Goal Formulation: With patient/family Time For Goal Achievement: 08/16/17 Potential to Achieve Goals: Fair Progress towards PT goals: Progressing toward goals    Frequency    Min 2X/week      PT Plan Current plan remains appropriate    Co-evaluation              AM-PAC PT "6 Clicks" Daily Activity  Outcome Measure  Difficulty turning over in bed (including adjusting bedclothes, sheets and blankets)?: Unable Difficulty moving from lying on back to sitting on the side of the bed? : Unable Difficulty sitting  down on and standing up from a chair with arms (e.g., wheelchair, bedside commode, etc,.)?: Unable Help needed moving to and from a bed to chair (including a wheelchair)?: Total Help needed walking in hospital room?: Total Help needed climbing 3-5 steps with a railing? : Total 6 Click Score: 6    End of Session   Activity Tolerance: Patient limited by lethargy Patient left: in bed;with call bell/phone within reach;with bed alarm set Nurse Communication: Mobility status PT Visit Diagnosis: Other abnormalities of gait and mobility (R26.89);Muscle weakness (generalized) (M62.81);History of falling (Z91.81);Difficulty in walking, not elsewhere classified (R26.2)     Time: 1610-9604 PT Time Calculation (min) (ACUTE ONLY): 18 min  Charges:  $Therapeutic Activity: 8-22 mins                     Kipp Laurence, PT, DPT 08/07/17 11:55 AM Pager: [phone]

## 2017-08-07 NOTE — Progress Notes (Signed)
Per MD [facility]. [facility] and she provided the needed [facility] to [facility]. CM filled out the form and faxed the required documentation. [name] was going to attempt to have the [facility] look [facility] today even though it is past the time frame to review the cases for rehab. CSW updated. CM following.

## 2017-08-07 NOTE — Clinical Social Work Note (Signed)
Clinical Social Work Assessment  Patient Details  Name: Levi Robinson MRN: [id number] Date of Birth: Mar 04, 1921  Date of referral:  08/07/17               Reason for consult:  Facility Placement                Permission sought to share information with:  Facility Sport and exercise psychologist, Family Supports Permission granted to share information::  Yes, Verbal Permission Granted  Name::     Engineer, structural  Agency::  Marita Snellen  Relationship::  Granddaughter  Contact Information:     Housing/Transportation Living arrangements for the past 2 months:  Single Family Home Source of Information:  Other (Comment Required) Patient Interpreter Needed:  None Criminal Activity/Legal Involvement Pertinent to Current Situation/Hospitalization:  No - Comment as needed Significant Relationships:  Other Family Members Lives with:  Self, Relatives Do you feel safe going back to the place where you live?  Yes Need for family participation in patient care:  Yes (Comment)  Care giving concerns:  Patient from home with granddaughter and part time caregivers, but is now requiring total care. [name] is not interested in pursuing hospice placement at this time, she wants to continue treatment. [name] requesting placement at SNF.   Social Worker assessment / plan:  CSW met with patient's granddaughter earlier this morning to discuss discharge planning. CSW received paperwork for patient to receive additional caregiving through the [place] for the MD to complete; MD unable to complete due to requiring information about chronic disability, granddaughter will need to take to the PCP. CSW sent referral to [facility] and [facility]; confirmed neither are able to take the patient under his insurance, the insurance will not pay for total care. CSW explained to granddaughter that if he went to SNF, then he would have to be private pay.   Employment status:  Retired Designer, industrial/product PT Recommendations:  [place] / Referral to community resources:  [place]  Patient/Family's Response to care:  [name] is requesting placement in SNF.  Patient/Family's Understanding of and Emotional Response to Diagnosis, Current Treatment, and Prognosis:  [name] has not been interested in discussions about pursuing a comfort/hospice approach for the patient, she wishes to pursue placement in SNF. [name] was hopeful for coverage, and asked questions about why the insurance wouldn't cover if he was talking and eating, but seemed to understand that due to his level of needs why the insurance would not cover. [name] wishes to pursue additional services through the [place] if the patient's medical insurance will not cover.  Emotional Assessment Appearance:  Appears stated age Attitude/Demeanor/Rapport:  Unable to Assess Affect (typically observed):  Unable to Assess Orientation:  Oriented to Self Alcohol / Substance use:  Not Applicable Psych involvement (Current and /or in the community):  No (Comment)  Discharge Needs  Concerns to be addressed:  Care Coordination Readmission within the last 30 days:  No Current discharge risk:  Dependent with Mobility, Physical Impairment, Chronically ill, Terminally ill, Cognitively Impaired Barriers to Discharge:  Continued Medical Work up, [place], [place] 08/07/2017, 8:34 AM

## 2017-08-08 LAB — GLUCOSE, CAPILLARY
GLUCOSE-CAPILLARY: 174 mg/dL — AB (ref 70–99)
GLUCOSE-CAPILLARY: 172 mg/dL — AB (ref 70–99)
Glucose-Capillary: 148 mg/dL — ABNORMAL HIGH (ref 70–99)
Glucose-Capillary: 221 mg/dL — ABNORMAL HIGH (ref 70–99)

## 2017-08-08 NOTE — Progress Notes (Signed)
PROGRESS NOTE    Burr MedicoBernice L Tufte  RUE:[id number] DOB: 10/01/1921 DOA: 07/30/2017 PCP: Georgianne Fickamachandran, Ajith, MD    Subjective:  Patient was seen and examined this morning, stable, was sleeping comfortably, no acute distress, No agitation this morning reported by nursing staff.  ------------------------------------------------------------------------------------------------------------------------------------------------------   Brief Narrative: Patient is a 84 year old male with past medical history of diabetes mellitus, hypertension, recent acute subdural hematoma, coronary artery disease, CHF, paroxysmal A. fib who was brought to the emergency department by family with complaints of altered mental status.  Patient had been reported to have physically and cognitively declined, not able to ambulate or feed himself.  He had recent hospitalization from 5/24 to 5/28 after a fall at home during which CT head showed significant  subdural hematoma with midline shift.  Patient was discharged to home after that.  CT head done on this admission showed increased size of subdural hematoma   and increased midline shift.  Neurosurgery was consulted by the emergency department.  No intervention was plan after discussion with family.  Palliative care also following.  Currently he is waiting for skilled nursing facility bed but looks like this will be lengthy process because insurance denied.  Current plan is [facility].If not ,he should be taken to home by family. Child psychotherapistsocial worker, case Production designer, theatre/television/filmmanager following. ------------------------------------------------------------------------------------------------------------------------------------------------------ Assessment & Plan:   Principal Problem:   Altered mental status Active Problems:   CAD (coronary artery disease)   Carotid arterial disease (HCC)   Nonproliferative diabetic retinopathy (HCC)   Atrial fibrillation with  RVR (HCC)   Subdural hematoma, acute (HCC)   Uncontrolled diabetes mellitus (HCC)   DNR (do not resuscitate)   Benign essential HTN   Palliative care patient  Acute encephalopathy/subdural hematoma/altered mental status:  Neurosurgery was apparently following no plan for intervention -Plan was discussed with the family the previous hospitalist and neurosurgery -No changes in mental status, has been stable per nursing staff. -Continue Keppra -No need for repeating CT after discussion with neurosurgery  Urinary tract infection:  Vitals are stable, afebrile normotensive Urine culture grew enterococcus.  Completed the course of antibiotic with ampicillin.Currently he is afebrile.  White cell count stable.  Elevated troponin:  Remained stable, no chest pain Troponin remained flat.  No further evaluation   History of DM: -SSI, continue Lantus  Last hemoglobin A1c was 9.4 on  5/19.    Deconditioning/debility: Physical therapy evaluated the patient and recommended a skilled nursing facility.  Advanced age/multiple comorbidities:  Palliative care following  CODE STATUS -DNR/DNI  DVT prophylaxis: SCD Code Status: DNR Family Communication: Discussed with granddaughter on phone Disposition Plan: [facility].  Social Investment banker, operationalworker/case manager following.  Patient is medically stable for discharge to SNF (or home if SNF not  possible).  Consultants: Neuro surgery  Procedures: None  Antimicrobials: None  Objective: Vitals:   08/07/17 2045 08/07/17 2319 08/08/17 0256 08/08/17 0746  BP: (!) 126/55 139/63 (!) 155/69 (!) 143/57  Pulse: 64 68 62 (!) 55  Resp: 17 19 19 15   Temp: 98.4 F (36.9 C) 98.7 F (37.1 C) 97.8 F (36.6 C) 97.6 F (36.4 C)  TempSrc: Oral Oral Oral Oral  SpO2: 98% 96% 95% 98%  Weight:      Height:        Intake/Output Summary (Last 24 hours) at 08/08/2017 1153 Last data filed at 08/07/2017 2100 Gross per 24 hour  Intake 330 ml  Output  325 ml  Net 5  ml   Filed Weights   07/30/17 1447 07/30/17 2100  Weight: 72.6 kg (160 lb) 63.2 kg (139 lb 5.3 oz)    BP (!) 143/57 (BP Location: Left Arm)   Pulse (!) 55   Temp 97.6 F (36.4 C) (Oral)   Resp 15   Ht 5\' 10"  (1.778 m)   Wt 63.2 kg (139 lb 5.3 oz)   SpO2 98%   BMI 19.99 kg/m    Physical Exam  Constitution: Able to awaken, confused at baseline, cognition poor Psychiatric: Stable, no agitation this morning  HEENT: Normocephalic, PERRL, otherwise with in Normal limits  Cardio vascular:  S1/S2, RRR, No murmure, No Rubs or Gallops  Chest/pulmonary: Clear to auscultation bilaterally, respirations unlabored  Chest symmetric Abdomen: Soft, non-tender, non-distended, bowel sounds,no masses, no organomegaly Muscular skeletal: Limited exam - in bed, able to move all 4 extremities, Normal strength,  Neuro: CNII-XII intact. , normal motor and sensation, reflexes intact  Extremities: No pitting edema lower extremities, +2 pulses  Skin: Dry, warm to touch, negative for any Rashes, No open wounds  Data Reviewed: I have personally reviewed following labs and imaging studies  CBC: Recent Labs  Lab 08/02/17 0304  WBC 6.6  HGB 12.6*  HCT 38.5*  MCV 94.8  PLT 156   Basic Metabolic Panel: Recent Labs  Lab 08/02/17 0304 08/05/17 1322  NA 141 138  K 4.5 3.9  CL 111 111  CO2 20* 19*  GLUCOSE 292* 276*  BUN 33* 12  CREATININE 1.37* 0.87  CALCIUM 8.6* 8.0*   CBG: Recent Labs  Lab 08/07/17 0632 08/07/17 1127 08/07/17 1647 08/07/17 2051 08/08/17 0621  GLUCAP 247* 238* 256* 241* 174*   Lipid    Radiology Studies: No results found. Scheduled Meds: . insulin aspart  0-9 Units Subcutaneous TID WC  . insulin glargine  10 Units Subcutaneous QHS  . isosorbide mononitrate  30 mg Oral QPC lunch  . mouth rinse  15 mL Mouth Rinse BID   Continuous Infusions:   LOS: 9 days    Time spent: 25 mins Kendell Bane, MD [facility] Pager [phone]  If  7PM-7AM, please contact night-coverage [email or web address] Password [facility] 08/08/2017, 11:53 AM

## 2017-08-09 LAB — GLUCOSE, CAPILLARY
GLUCOSE-CAPILLARY: 155 mg/dL — AB (ref 70–99)
GLUCOSE-CAPILLARY: 126 mg/dL — AB (ref 70–99)
Glucose-Capillary: 73 mg/dL (ref 70–99)
Glucose-Capillary: 144 mg/dL — ABNORMAL HIGH (ref 70–99)

## 2017-08-09 NOTE — Progress Notes (Signed)
PROGRESS NOTE    Burr MedicoBernice L Jian  ZOX:[id number] DOB: 01/06/1922 DOA: 07/30/2017 PCP: Georgianne Fickamachandran, Ajith, MD    Subjective:  She was seen and examined, sleepy, was arousable, per nursing staff no issues overnight.  ------------------------------------------------------------------------------------------------------------------------------------------------------   Brief Narrative: Patient is a 84 year old male with past medical history of diabetes mellitus, hypertension, recent acute subdural hematoma, coronary artery disease, CHF, paroxysmal A. fib who was brought to the emergency department by family with complaints of altered mental status.  Patient had been reported to have physically and cognitively declined, not able to ambulate or feed himself.  He had recent hospitalization from 5/24 to 5/28 after a fall at home during which CT head showed significant  subdural hematoma with midline shift.  Patient was discharged to home after that.  CT head done on this admission showed increased size of subdural hematoma   and increased midline shift.  Neurosurgery was consulted by the emergency department.  No intervention was plan after discussion with family.  Palliative care also following.  Currently he is waiting for skilled nursing facility bed but looks like this will be lengthy process because insurance denied.  Current plan is to [facility].If not ,he should be taken to home by family. Child psychotherapistsocial worker, case Production designer, theatre/television/filmmanager following. ------------------------------------------------------------------------------------------------------------------------------------------------------ Assessment & Plan:   Principal Problem:   Altered mental status Active Problems:   CAD (coronary artery disease)   Carotid arterial disease (HCC)   Nonproliferative diabetic retinopathy (HCC)   Atrial fibrillation with RVR (HCC)   Subdural hematoma, acute (HCC)   Uncontrolled  diabetes mellitus (HCC)   DNR (do not resuscitate)   Benign essential HTN   Palliative care patient  Acute encephalopathy/subdural hematoma/altered mental status:  No changes in mental status, currently stable, per nursing staff no issues overnight Neurosurgery was apparently following no plan for intervention -Plan was discussed with the family the previous hospitalist and neurosurgery -Continue Keppra -No need for repeating CT after discussion with neurosurgery  Urinary tract infection:  No signs of infection Urine culture grew enterococcus.  Completed the course of antibiotic with ampicillin.Currently he is afebrile.  White cell count stable.  Elevated troponin:  Remained stable, no chest pain Troponin remained flat.  No further evaluation   History of DM: -SSI, continue Lantus  Last hemoglobin A1c was 9.4 on  5/19.    Deconditioning/debility: Physical therapy evaluated the patient and recommended a skilled nursing facility.  Advanced age/multiple comorbidities:  Palliative care following  CODE STATUS -DNR/DNI  DVT prophylaxis: SCD Code Status: DNR Family Communication: Discussed with granddaughter on phone Disposition Plan: [facility].  Social Investment banker, operationalworker/case manager following.  Patient is medically stable for discharge to SNF (or home if SNF not  Possible). Social worker has contacted [facility] contracted SNF's for bed availability, referral was sent to [facility]  Consultants: Neuro surgery  Procedures: None  Antimicrobials: None  Objective: Vitals:   08/08/17 2049 08/08/17 2353 08/09/17 0400 08/09/17 0832  BP: 133/64 (!) 155/61 (!) 171/71 (!) 156/105  Pulse: 79 80 66 73  Resp: 20 19 20 16   Temp: 98.3 F (36.8 C) 97.8 F (36.6 C) 97.9 F (36.6 C) 97.7 F (36.5 C)  TempSrc: Oral Oral Oral Oral  SpO2: 96% 94% 96% 100%  Weight:      Height:        Intake/Output Summary (Last 24 hours) at 08/09/2017 1311 Last data filed at 08/09/2017  0840 Gross per 24 hour  Intake 120  ml  Output 200 ml  Net -80 ml   Filed Weights   07/30/17 1447 07/30/17 2100  Weight: 72.6 kg (160 lb) 63.2 kg (139 lb 5.3 oz)    BP (!) 156/105 (BP Location: Left Arm)   Pulse 73   Temp 97.7 F (36.5 C) (Oral)   Resp 16   Ht 5\' 10"  (1.778 m)   Wt 63.2 kg (139 lb 5.3 oz)   SpO2 100%   BMI 19.99 kg/m    BP (!) 156/105 (BP Location: Left Arm)   Pulse 73   Temp 97.7 F (36.5 C) (Oral)   Resp 16   Ht 5\' 10"  (1.778 m)   Wt 63.2 kg (139 lb 5.3 oz)   SpO2 100%   BMI 19.99 kg/m    Physical Exam  Constitution: Sleeping comfortably, difficult to arouse, poor cognition, poor insight,   HEENT: Normocephalic, PERRL, otherwise with in Normal limits  Cardio vascular:  S1/S2, RRR, No murmure, No Rubs or Gallops  Chest/pulmonary: Clear to auscultation bilaterally, respirations unlabored  Chest symmetric Abdomen: Soft, non-tender, non-distended, bowel sounds,no masses, no organomegaly Muscular skeletal: Limited exam - in bed, able to move all 4 extremities, Neuro: Omitted exam as patient is sleepy, Extremities: No pitting edema lower extremities, +2 pulses  Skin: Dry, warm to touch, negative for any Rashes, No open wounds      Data Reviewed: I have personally reviewed following labs and imaging studies  CBC: No results for input(s): WBC, NEUTROABS, HGB, HCT, MCV, PLT in the last 168 hours. Basic Metabolic Panel: Recent Labs  Lab 08/05/17 1322  NA 138  K 3.9  CL 111  CO2 19*  GLUCOSE 276*  BUN 12  CREATININE 0.87  CALCIUM 8.0*   CBG: Recent Labs  Lab 08/08/17 1158 08/08/17 1652 08/08/17 2133 08/09/17 0624 08/09/17 1148  GLUCAP 148* 172* 221* 155* 73   Lipid    Radiology Studies: No results found. Scheduled Meds: . insulin aspart  0-9 Units Subcutaneous TID WC  . insulin glargine  10 Units Subcutaneous QHS  . isosorbide mononitrate  30 mg Oral QPC lunch  . mouth rinse  15 mL Mouth Rinse BID   Continuous Infusions:    LOS: 10 days    Time spent: 25 mins Kendell Bane, MD [facility] Pager [phone]  If 7PM-7AM, please contact night-coverage [email or web address] Password [email or web address] 08/09/2017, 1:11 PM

## 2017-08-10 LAB — GLUCOSE, CAPILLARY
GLUCOSE-CAPILLARY: 121 mg/dL — AB (ref 70–99)
GLUCOSE-CAPILLARY: 236 mg/dL — AB (ref 70–99)
Glucose-Capillary: 182 mg/dL — ABNORMAL HIGH (ref 70–99)
Glucose-Capillary: 185 mg/dL — ABNORMAL HIGH (ref 70–99)

## 2017-08-10 NOTE — Progress Notes (Signed)
Nutrition Follow-up  DOCUMENTATION CODES:   Not applicable  INTERVENTION:  Monitor for needs  NUTRITION DIAGNOSIS:   Inadequate oral intake related to lethargy/confusion as evidenced by other (comment)(no documented intakes, did not have breakfast 7/22 AM, a/o self only and notes indicating unresponsiveness.). -ongoing, unlikely to meet needs at this time  GOAL:   Patient will meet greater than or equal to 90% of their needs -not meeting  MONITOR:   PO intake, Weight trends, Labs, I & O's, Other (Comment)(POC/GOC)  REASON FOR ASSESSMENT:   Malnutrition Screening Tool    ASSESSMENT:   [name] with PMH of DM, HTN, recent acute subdural hematoma, carotid and coronary disease, CHF, paroxysmal atrial fibrillation; family brought pt in ED for altered mental status.  Patient was sleeping soundly at bedside today, did not awaken. Discussed with RN, he ate 25% today for breakfast. Avg PO intake has been 41% over the past 4 days. He is awaiting [facility] bed [facility]. Palliative care is following.  Medications reviewed and include:  Insulin  Labs reviewed:  CBGs 126, 144, 121, 182   Diet Order:   Diet Order           DIET DYS 3 Room service appropriate? Yes; Fluid consistency: Thin  Diet effective now          EDUCATION NEEDS:   Not appropriate for education at this time  Skin:  Skin Assessment: Reviewed RN Assessment  Last BM:  7/22  Height:   Ht Readings from Last 1 Encounters:  07/30/17 5\' 10"  (1.778 m)    Weight:   Wt Readings from Last 1 Encounters:  07/30/17 139 lb 5.3 oz (63.2 kg)    Ideal Body Weight:  75.45 kg  BMI:  Body mass index is 19.99 kg/m.  Estimated Nutritional Needs:   Kcal:  1300-1500  Protein:  65-80 gm  Fluid:  >/= 1.5 L    Dionne AnoWilliam M. Ferrel Simington, MS, RD LDN Inpatient Clinical Dietitian Pager [phone]

## 2017-08-10 NOTE — Progress Notes (Signed)
Daily Progress Note   Patient Name: Levi Robinson       Date: 08/10/2017 DOB: 05-23-1921  Age: 84 y.o. MRN#: [id number] Attending Physician: Kendell Bane, MD Primary Care Physician: Georgianne Fick, MD Admit Date: 07/30/2017  Reason for Consultation/Follow-up: Establishing goals of care and Psychosocial/spiritual support  Subjective: The patient greets me this morning with "hello" but doesn't speak otherwise.  He is very calm and appears comfortable.  I fed him oat meal with brown sugar and orange juice.  He ate well and did not appear to show signs of aspiration.  Spoke with [name] on the phone.  She visited a SNF in [place] yesterday and anticipates her grandfather will go there.   Assessment: Calm, comfortable, eating well.  Likely bedbound.  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   Family is reasonable "Treat the treatable" but they do not want invasive procedures.     Patient Profile/HPI:  84 y.o. male, "BB", with past medical history of fall and SDH in late May 2019, paroxysmal afib/aflutter, CAD, and brittle diabetes who was admitted on 07/30/2017 with altered mental status.  ER work up revealed enlargement of the previous SDH including an 8 mm midline shift. (in May midline shift was 3 mm) as well as a UTI.  Family has declined any surgical procedures.  Length of Stay: 11  Current Medications: Scheduled Meds:  . insulin aspart  0-9 Units Subcutaneous TID WC  . insulin glargine  10 Units Subcutaneous QHS  . isosorbide mononitrate  30 mg Oral QPC lunch  . mouth rinse  15 mL Mouth Rinse BID    Continuous Infusions:   PRN Meds: acetaminophen **OR** acetaminophen, ondansetron **OR** ondansetron (ZOFRAN) IV, polyethylene  glycol  Physical Exam        Well developed elderly gentleman who spoke only 1 word to me.  He lifts his head to eat, and seems to swallow effectively.  He does not attempt to use his hands to feed himself. Resp no distress or increased work of breathing Abd.  Soft, nt, nd  Vital Signs: BP (!) 136/59 (BP Location: Left Arm)   Pulse 62   Temp 98 F (36.7 C) (Oral)   Resp 18   Ht 5\' 10"  (1.778 m)   Wt 63.2 kg (139  lb 5.3 oz)   SpO2 99%   BMI 19.99 kg/m  SpO2: SpO2: 99 % O2 Device: O2 Device: Room Air O2 Flow Rate:    Intake/output summary:   Intake/Output Summary (Last 24 hours) at 08/10/2017 [id number] Last data filed at 08/10/2017 0559 Gross per 24 hour  Intake 140 ml  Output 600 ml  Net -460 ml   LBM: Last BM Date: 08/05/17 Baseline Weight: Weight: 72.6 kg (160 lb) Most recent weight: Weight: 63.2 kg (139 lb 5.3 oz)       Palliative Assessment/Data: 30%    Flowsheet Rows     Most Recent Value  Intake Tab  Referral Department  Hospitalist  Unit at Time of Referral  Med/Surg Unit  Palliative Care Primary Diagnosis  Neurology  Date Notified  07/31/17  Palliative Care Type  New Palliative care  Reason for referral  Clarify Goals of Care, Non-pain Symptom  Date of Admission  07/30/17  Date first seen by Palliative Care  07/31/17  # of days Palliative referral response time  0 Day(s)  # of days IP prior to Palliative referral  1  Clinical Assessment  Psychosocial & Spiritual Assessment  Palliative Care Outcomes      Patient Active Problem List   Diagnosis Date Noted  . Palliative care patient   . PAF (paroxysmal atrial fibrillation) (HCC)   . Benign essential HTN   . Stage 3 chronic kidney disease (HCC)   . Atrial fibrillation with RVR (HCC) 06/05/2017  . Subdural hematoma, acute (HCC) 06/05/2017  . Altered mental status 06/05/2017  . Acute CHF (congestive heart failure) (HCC) 06/05/2017  . Fall 06/05/2017  . Uncontrolled diabetes mellitus (HCC) 06/05/2017  .  DNR (do not resuscitate) 06/05/2017  . Subdural hematoma (HCC) 06/05/2017  . Occlusion and stenosis of carotid artery without mention of cerebral infarction 08/04/2011  . Hypertension   . CAD (coronary artery disease)   . Carotid arterial disease (HCC)   . Anemia   . Posterior vitreous detachment 06/05/2011  . Status post intraocular lens implant 06/05/2011  . Branch retinal vein occlusion 12/12/2010  . Nonproliferative diabetic retinopathy (HCC) 12/12/2010    Palliative Care Plan    Recommendations/Plan:  PMT will sign off.    Recommend Palliative to follow at SNF on discharge.  Goals of Care and Additional Recommendations:  Limitations on Scope of Treatment: No Surgical Procedures  Code Status:  DNR  Prognosis:  < 6 months given SDH with midline shift causing in essence an advanced dementia like syndrome.   Discharge Planning:  Skilled Nursing Facility for rehab with Palliative care service follow-up  Care plan was discussed with Archie Pattenonya [facility])  Thank you for allowing the Palliative Medicine Team to assist in the care of this patient.  Total time spent:  25 min.     Greater than 50%  of this time was spent counseling and coordinating care related to the above assessment and plan.  Norvel RichardsMarianne Aiysha Jillson, PA-C Palliative Medicine  Please contact Palliative MedicineTeam phone at [phone] for questions and concerns between 7 am - 7 pm.   Please see AMION for individual provider pager numbers.

## 2017-08-10 NOTE — Progress Notes (Signed)
Granddaughter requesting [facility]. [facility] on Friday but not reviewed. Updated information sent to the [facility] this am. VA to review his case today and update CM/CSW on what they are going to approve. CM following.

## 2017-08-10 NOTE — Progress Notes (Signed)
PROGRESS NOTE    Levi Robinson  ONG:[id number] DOB: 1921-08-24 DOA: 07/30/2017 PCP: Georgianne Fick, MD    Subjective:  Patient was seen and examined this morning, was able to awaken, comfortable in bed. He remained stable, with minimum function, currently bedbound.   The case was discussed with case management, palliative notes recommendations were all reviewed. Case management informed us that patient family requesting VA bed at SNF Patient has been faxed, pending VA review, and acceptance   ------------------------------------------------------------------------------------------------------------------------------------------------------   Brief Narrative: Patient is a 84 year old male with past medical history of diabetes mellitus, hypertension, recent acute subdural hematoma, coronary artery disease, CHF, paroxysmal A. fib who was brought to the emergency department by family with complaints of altered mental status.  Patient had been reported to have physically and cognitively declined, not able to ambulate or feed himself.  He had recent hospitalization from 5/24 to 5/28 after a fall at home during which CT head showed significant  subdural hematoma with midline shift.  Patient was discharged to home after that.  CT head done on this admission showed increased size of subdural hematoma   and increased midline shift.  Neurosurgery was consulted by the emergency department.  No intervention was plan after discussion with family.  Palliative care also following.  Currently he is waiting for skilled nursing facility bed but looks like this will be lengthy process because insurance denied.  Current plan is to discharge him to skilled nursing facility under [place].If not ,he should be taken to home by family. Child psychotherapist, case Production designer, theatre/television/film  following. ------------------------------------------------------------------------------------------------------------------------------------------------------ Assessment & Plan:   Principal Problem:   Altered mental status Active Problems:   CAD (coronary artery disease)   Carotid arterial disease (HCC)   Nonproliferative diabetic retinopathy (HCC)   Atrial fibrillation with RVR (HCC)   Subdural hematoma, acute (HCC)   Uncontrolled diabetes mellitus (HCC)   DNR (do not resuscitate)   Benign essential HTN   Palliative care patient  Acute encephalopathy/subdural hematoma/altered mental status:  No change in mental status, able to follow simple commands, bedbound, with minimal function -Needs assist with all ADLs.   -Neurology has signed off -Neurosurgery was apparently following no plan for intervention -Plan was discussed with the family the previous hospitalist and neurosurgery -Continue Keppra -No need for repeating CT after discussion with neurosurgery  Urinary tract infection:  No signs of infection Urine culture grew enterococcus.  Completed the course of antibiotic with ampicillin.Currently he is afebrile.  White cell count stable.  Elevated troponin:  Main stable, no report of chest pain or shortness of breath Troponin remained flat.  No further evaluation   History of DM: -SSI, continue Lantus  Last hemoglobin A1c was 9.4 on  5/19.    Deconditioning/debility: Physical therapy evaluated the patient and recommended a skilled nursing facility.  Advanced age/multiple comorbidities:  Palliative care following  CODE STATUS -DNR/DNI  DVT prophylaxis: SCD Code Status: DNR Family Communication: Discussed with granddaughter on phone Disposition Plan: Skilled nursing facility under [place].  Social Investment banker, operational following.  Patient is medically stable for discharge to SNF (or home if SNF not  Possible). Social worker has contacted VA contracted SNF's for bed  availability, referral was sent to [facility]  Consultants: Neuro surgery  Procedures: None  Antimicrobials: None  Objective: Vitals:   08/09/17 2020 08/10/17 0034 08/10/17 0428 08/10/17 0901  BP: (!) 140/56 (!) 151/73 130/71 (!) 136/59  Pulse: 68 71 68 62  Resp: 20 20 20 18   Temp: 97.6  F (36.4 C) 98.2 F (36.8 C) (!) 97.5 F (36.4 C) 98 F (36.7 C)  TempSrc: Oral Oral Oral Oral  SpO2: 97% 97% 97% 99%  Weight:      Height:        Intake/Output Summary (Last 24 hours) at 08/10/2017 1132 Last data filed at 08/10/2017 1100 Gross per 24 hour  Intake 140 ml  Output 900 ml  Net -760 ml   Filed Weights   07/30/17 1447 07/30/17 2100  Weight: 72.6 kg (160 lb) 63.2 kg (139 lb 5.3 oz)   BP (!) 136/59 (BP Location: Left Arm)   Pulse 62   Temp 98 F (36.7 C) (Oral)   Resp 18   Ht 5\' 10"  (1.778 m)   Wt 63.2 kg (139 lb 5.3 oz)   SpO2 99%   BMI 19.99 kg/m    Physical Exam  Constitution: Awake follows some simple commands, Psychiatric: Cognition altered, poor insight HEENT: Normocephalic, PERRL, otherwise with in Normal limits  Cardio vascular:  S1/S2, RRR, No murmure, No Rubs or Gallops  Chest/pulmonary: Clear to auscultation bilaterally, respirations unlabored  Chest symmetric Abdomen: Soft, non-tender, non-distended, bowel sounds,no masses, no organomegaly Muscular skeletal: Limited exam - in bed, a Neuro: Patient was able to be awoken, follows simple commands otherwise confused Extremities: No pitting edema lower extremities, +2 pulses  Skin: Dry, warm to touch, negative for any Rashes, No open wounds   Data Reviewed: I have personally reviewed following labs and imaging studies  CBC: No results for input(s): WBC, NEUTROABS, HGB, HCT, MCV, PLT in the last 168 hours. Basic Metabolic Panel: Recent Labs  Lab 08/05/17 1322  NA 138  K 3.9  CL 111  CO2 19*  GLUCOSE 276*  BUN 12  CREATININE 0.87  CALCIUM 8.0*   CBG: Recent Labs  Lab 08/09/17 0624  08/09/17 1148 08/09/17 1534 08/09/17 2137 08/10/17 0620  GLUCAP 155* 73 126* 144* 121*   Lipid    Radiology Studies: No results found. Scheduled Meds: . insulin aspart  0-9 Units Subcutaneous TID WC  . insulin glargine  10 Units Subcutaneous QHS  . isosorbide mononitrate  30 mg Oral QPC lunch  . mouth rinse  15 mL Mouth Rinse BID   Continuous Infusions:   LOS: 11 days    Time spent: 25 mins Kendell BaneSeyed A Abdoulaye Drum, MD [facility] Pager [phone]  If 7PM-7AM, please contact night-coverage [email or web address] Password [email or web address] 08/10/2017, 11:32 AM

## 2017-08-10 NOTE — Progress Notes (Signed)
CSW following for discharge planning. [place]. [facility]erral has been sent to [facility] in [place]; awaiting decision. CSW sending referral to [facility] [place], as they may potentially have long term care beds coming open soon. [name] also has no long term care beds available.  CSW to continue to follow.  Levi NicelyElizabeth Dominick Robinson, Kentucky[place]CS[place] Clinical Social Worker [phone]

## 2017-08-11 DIAGNOSIS — I10 Essential (primary) hypertension: Secondary | ICD-10-CM

## 2017-08-11 DIAGNOSIS — I4891 Unspecified atrial fibrillation: Secondary | ICD-10-CM

## 2017-08-11 LAB — GLUCOSE, CAPILLARY
Glucose-Capillary: 125 mg/dL — ABNORMAL HIGH (ref 70–99)
Glucose-Capillary: 153 mg/dL — ABNORMAL HIGH (ref 70–99)
Glucose-Capillary: 293 mg/dL — ABNORMAL HIGH (ref 70–99)

## 2017-08-11 NOTE — Care Management Important Message (Signed)
Important Message  Patient Details  Name: Levi Robinson MRN: [id number] Date of Birth: 05/17/1921   Medicare Important Message Given:  Yes   Pt could not sign.    Bernadette HoitShoffner, Nakaila Freeze Coleman 08/11/2017, 11:31 AM

## 2017-08-11 NOTE — Progress Notes (Signed)
Physical Therapy Treatment Patient Details Name: Levi Robinson MRN: [id number] DOB: 08/17/1921 Today's Date: 08/11/2017    History of Present Illness Patient is a 84 y/o male initially presenting to the ED on 07/30/17 due to AMS. Of note, recent hospital admission 5/24- 06/09/17 after a fall at home with CT head showing significant right subdural hematoma with right greater than left shift. Most recent MRI revealing Enlarging right acute on chronic subdural hematoma, now measuring 2.8 cm in maximal diameter, previously 2.1 cm. Resultant increased right-to-left midline shift, now measuring 8 mm, previously 3 mm. PMH significant for DM,  HTN, recent acute subdural hematoma, carotid and coronary  disease, CHF, paroxysmal atrial fibrillation.    PT Comments    Patient is more alert this session and greeted therapist upon arrival however pt did not speak again during session. Pt demonstrated more purposeful movement and able to maintain sitting balance EOB with min guard at times with bilat UE support. Max A +2 for transfer bed to recliner. VSS. Continue to progress as tolerated with anticipated d/c to SNF for further skilled PT services.     Follow Up Recommendations  SNF;Supervision/Assistance - 24 hour     Equipment Recommendations  None recommended by PT    Recommendations for Other Services       Precautions / Restrictions Precautions Precautions: Fall Restrictions Weight Bearing Restrictions: No    Mobility  Bed Mobility Overal bed mobility: Needs Assistance Bed Mobility: Supine to Sit     Supine to sit: +2 for physical assistance;Max assist;HOB elevated     General bed mobility comments: +2 assist required to bring bilat LE and hips to EOB and to elevate trunk into sitting; pt assisted to mobilize bilat LE and reaching with bilat UE when movement initiated by therapist   Transfers Overall transfer level: Needs assistance Equipment used: (use of gait belt and bed  pad) Transfers: Squat Pivot Transfers     Squat pivot transfers: Max assist;+2 physical assistance;From elevated surface     General transfer comment: Patient able to maintain anterior translation of trunk during transfer  Ambulation/Gait                 Stairs             Wheelchair Mobility    Modified Rankin (Stroke Patients Only)       Balance Overall balance assessment: Needs assistance Sitting-balance support: Bilateral upper extremity supported;Feet supported Sitting balance-Leahy Scale: Fair     Standing balance support: Bilateral upper extremity supported Standing balance-Leahy Scale: Zero                              Cognition Arousal/Alertness: Awake/alert Behavior During Therapy: Flat affect Overall Cognitive Status: No family/caregiver present to determine baseline cognitive functioning Area of Impairment: Following commands;Safety/judgement;Problem solving                       Following Commands: Follows one step commands inconsistently;Follows one step commands with increased time Safety/Judgement: Decreased awareness of safety   Problem Solving: Slow processing;Decreased initiation;Difficulty sequencing;Requires verbal cues;Requires tactile cues General Comments: pt greeted therapist upon arrival however did not speak again or answer questions with head nods rest of session      Exercises      General Comments General comments (skin integrity, edema, etc.): VSS      Pertinent Vitals/Pain Pain Assessment: Faces Faces Pain Scale: No hurt  Home Living                      Prior Function            PT Goals (current goals can now be found in the care plan section) Acute Rehab PT Goals Patient Stated Goal: none stated PT Goal Formulation: With patient/family Time For Goal Achievement: 08/16/17 Potential to Achieve Goals: Fair Progress towards PT goals: Progressing toward goals     Frequency    Min 2X/week      PT Plan Current plan remains appropriate    Co-evaluation              AM-PAC PT "6 Clicks" Daily Activity  Outcome Measure  Difficulty turning over in bed (including adjusting bedclothes, sheets and blankets)?: Unable Difficulty moving from lying on back to sitting on the side of the bed? : Unable Difficulty sitting down on and standing up from a chair with arms (e.g., wheelchair, bedside commode, etc,.)?: Unable Help needed moving to and from a bed to chair (including a wheelchair)?: A Lot Help needed walking in hospital room?: Total Help needed climbing 3-5 steps with a railing? : Total 6 Click Score: 7    End of Session Equipment Utilized During Treatment: Gait belt Activity Tolerance: Patient tolerated treatment well Patient left: with call bell/phone within reach;in chair;with chair alarm set;with nursing/sitter in room Nurse Communication: Mobility status PT Visit Diagnosis: Other abnormalities of gait and mobility (R26.89);Muscle weakness (generalized) (M62.81);History of falling (Z91.81);Difficulty in walking, not elsewhere classified (R26.2)     Time: 1610-9604 PT Time Calculation (min) (ACUTE ONLY): 24 min  Charges:  $Therapeutic Activity: 23-37 mins                     Erline Levine, PTA Pager: [phone]     Carolynne Edouard 08/11/2017, 2:54 PM

## 2017-08-11 NOTE — Progress Notes (Signed)
[facility]                                                                              Patient Demographics  Levi Robinson, is a 84 y.o. male, DOB - 1921-07-27, [id number]  Admit date - 07/30/2017   Admitting Physician Ejiroghene Wendall Stade, MD  Outpatient Primary MD for the patient is Georgianne Fick, MD  Outpatient specialists:   LOS - 12  days   Medical records reviewed and are as summarized below:    Chief Complaint  Patient presents with  . Altered Mental Status       Brief summary   Patient is a 84 year old male with past medical history of diabetes mellitus, hypertension, recent acute subdural hematoma, coronary artery disease, CHF, paroxysmal A. fib who was brought to the emergency department by family with complaints of altered mental status.  Patient had been reported to have physically and cognitively declined, not able to ambulate or feed himself.  He had recent hospitalization from 5/24 to 5/28 after a fall at home during which CT head showed significant  subdural hematoma with midline shift.  Patient was discharged to home after that.  CT head done on this admission showed increased size of subdural hematoma and increased midline shift.  Neurosurgery, Dr. Franky Macho was consulted in the ED, family declined surgery and opted for comfort care route.  Palliative care was consulted.  Currently awaiting for skilled nursing facility bed.      Assessment & Plan    Principal Problem: Acute metabolic encephalopathy secondary to increasing subdural hematoma -Family had discussed with neurosurgery, Dr. Franky Macho and declined surgery, opted for comfort care route -Per neurosurgery, repeating CT head will not change any management.  Family requested skilled nursing facility placement. -Keppra was discontinued after discussion with neurosurgery.  Enterococcus UTI -Urine culture showed enterococcus, patient has completed the course of antibiotics with  ampicillin  Elevated troponin -No chest pain or shortness of breath, troponins remained flat, likely due to #1 -Comfort care status per family, no invasive interventions  Diabetes mellitus type 2,  -Hemoglobin A1c 9.4 on 5/19, continue Lantus, sliding scale insulin  Generalized debility with failure to thrive -Palliative care consult was consulted and recommended follow-up at the skilled nursing facility after discharge.   Code Status: DNR/DNI DVT Prophylaxis:   SCD's Family Communication: Awaiting facility, no family member at the bedside,   Disposition Plan: Awaiting facility  Time Spent in minutes  25 minutes  Procedures:  None  Consultants:   Neurosurgery  Antimicrobials:   Completed course of ampicillin   Medications  Scheduled Meds: . insulin aspart  0-9 Units Subcutaneous TID WC  . insulin glargine  10 Units Subcutaneous QHS  . isosorbide mononitrate  30 mg Oral QPC lunch  . mouth rinse  15 mL Mouth Rinse BID   Continuous Infusions: PRN Meds:.acetaminophen **OR** acetaminophen, ondansetron **OR** ondansetron (ZOFRAN) IV, polyethylene glycol   Antibiotics   Anti-infectives (From admission, onward)   Start     Dose/Rate Route Frequency Ordered Stop   08/05/17 1800  ampicillin (OMNIPEN) 1 g in sodium chloride 0.9 % 100 mL IVPB  Status:  Discontinued     1 g 300 mL/hr over 20 Minutes Intravenous Every 6 hours 08/05/17 1415 08/07/17 1319   08/02/17 1600  vancomycin (VANCOCIN) 500 mg in sodium chloride 0.9 % 100 mL IVPB  Status:  Discontinued     500 mg 100 mL/hr over 60 Minutes Intravenous Every 24 hours 08/01/17 1442 08/02/17 0755   08/02/17 1600  ampicillin (OMNIPEN) 1 g in sodium chloride 0.9 % 100 mL IVPB  Status:  Discontinued     1 g 300 mL/hr over 20 Minutes Intravenous Every 8 hours 08/02/17 0755 08/05/17 1415   08/01/17 1500  vancomycin (VANCOCIN) IVPB 1000 mg/200 mL premix     1,000 mg 200 mL/hr over 60 Minutes Intravenous  Once 08/01/17 1442  08/01/17 1730   07/31/17 0030  cefTRIAXone (ROCEPHIN) 1 g in sodium chloride 0.9 % 100 mL IVPB  Status:  Discontinued     1 g 200 mL/hr over 30 Minutes Intravenous Every 24 hours 07/31/17 0030 08/01/17 1422        Subjective:   Levi CanavanBernice Mcginniss was seen and examined today.  Alert and awake, denies any specific complaints, still somewhat confused.  No acute issues overnight, no fevers or chills, appears stable  Objective:   Vitals:   08/10/17 1941 08/10/17 2337 08/11/17 0331 08/11/17 0732  BP: (!) 113/53 114/81 136/71 121/60  Pulse: 61 73 77 65  Resp: 18 18 18 16   Temp: 97.9 F (36.6 C) 98.7 F (37.1 C) 97.6 F (36.4 C) 98 F (36.7 C)  TempSrc: Oral Oral Oral Oral  SpO2: 96% 96% 98% 97%  Weight:      Height:        Intake/Output Summary (Last 24 hours) at 08/11/2017 1014 Last data filed at 08/11/2017 0332 Gross per 24 hour  Intake 240 ml  Output 1400 ml  Net -1160 ml     Wt Readings from Last 3 Encounters:  07/30/17 63.2 kg (139 lb 5.3 oz)  06/30/17 72.6 kg (160 lb)  06/05/17 72.6 kg (160 lb)     Exam  General: Alert and oriented x self, knows he is in the hospital, NAD, follows simple commands  Eyes: PERRLA, EOMI, Anicteric Sclera,  HEENT:    Cardiovascular: S1 S2 auscultated, Regular rate and rhythm.  Respiratory: Clear to auscultation bilaterally, no wheezing, rales or rhonchi  Gastrointestinal: Soft, nontender, nondistended, + bowel sounds  Ext: no pedal edema bilaterally  Neuro: moving all 4 extremities  Musculoskeletal: No digital cyanosis, clubbing  Skin: No rashes  Psych: Normal affect and demeanor, alert and oriented x3    Data Reviewed:  I have personally reviewed following labs and imaging studies  Micro Results No results found for this or any previous visit (from the past 240 hour(s)).  Radiology Reports Dg Chest 2 View  Result Date: 07/30/2017 CLINICAL DATA:  Altered mental status for the past 4 days. EXAM: CHEST - 2 VIEW  COMPARISON:  Chest x-ray dated Jun 05, 2017. FINDINGS: Stable cardiac silhouette, at the upper limits of normal in size. Normal pulmonary vascularity. Stable left greater than right bibasilar atelectasis/scarring. No focal consolidation, pleural effusion, or pneumothorax. No acute osseous abnormality. IMPRESSION: Bibasilar atelectasis/scarring, unchanged. No active cardiopulmonary disease. Electronically Signed   By: Obie DredgeWilliam T Derry M.D.   On: 07/30/2017 16:02   Ct Head Wo Contrast  Result Date: 07/30/2017 CLINICAL DATA:  Altered mental status. EXAM: CT HEAD WITHOUT CONTRAST TECHNIQUE: Contiguous axial images were obtained from the base of the skull through the vertex  without intravenous contrast. COMPARISON:  CT head dated June 15, 2017. FINDINGS: Brain: Enlarging, predominantly isodense right subdural hematoma, now measuring up to 2.8 cm in maximal diameter, previously 2.1 cm. There are areas of layering hyperdensity posteriorly, consistent with recent hemorrhage. Increased right to left midline shift, now measuring 8 mm, previously 3 mm. There is near complete effacement of the right lateral ventricle with minimally increased entrapment of the left lateral ventricle. Worsening right cerebral hemisphere sulcal effacement. Basal cisterns remain patent. Resolved left subdural hygroma. No evidence of acute infarction. Stable chronic microvascular ischemic changes. Vascular: Atherosclerotic vascular calcification of the carotid siphons. No hyperdense vessel. Skull: Normal. Negative for fracture or focal lesion. Sinuses/Orbits: No acute finding. Other: None. IMPRESSION: 1. Enlarging right acute on chronic subdural hematoma, now measuring 2.8 cm in maximal diameter, previously 2.1 cm. Resultant increased right-to-left midline shift, now measuring 8 mm, previously 3 mm. 2. Increased right cerebral hemisphere sulcal effacement with near complete effacement of the right lateral ventricle and minimally increased  entrapment of the left lateral ventricle. 3. Resolved left subdural hygroma. Critical Value/emergent results were called by telephone at the time of interpretation on 07/30/2017 at 3:51 pm to Dr. Arthor Captain , who verbally acknowledged these results. Electronically Signed   By: Obie Dredge M.D.   On: 07/30/2017 15:53    Lab Data:  CBC: No results for input(s): WBC, NEUTROABS, HGB, HCT, MCV, PLT in the last 168 hours. Basic Metabolic Panel: Recent Labs  Lab 08/05/17 1322  NA 138  K 3.9  CL 111  CO2 19*  GLUCOSE 276*  BUN 12  CREATININE 0.87  CALCIUM 8.0*   GFR: Estimated Creatinine Clearance: 47.4 mL/min (by C-G formula based on SCr of 0.87 mg/dL). Liver Function Tests: No results for input(s): AST, ALT, ALKPHOS, BILITOT, PROT, ALBUMIN in the last 168 hours. No results for input(s): LIPASE, AMYLASE in the last 168 hours. No results for input(s): AMMONIA in the last 168 hours. Coagulation Profile: No results for input(s): INR, PROTIME in the last 168 hours. Cardiac Enzymes: No results for input(s): CKTOTAL, CKMB, CKMBINDEX, TROPONINI in the last 168 hours. BNP (last 3 results) No results for input(s): PROBNP in the last 8760 hours. HbA1C: No results for input(s): HGBA1C in the last 72 hours. CBG: Recent Labs  Lab 08/10/17 0620 08/10/17 1151 08/10/17 1620 08/10/17 2135 08/11/17 0608  GLUCAP 121* 182* 236* 185* 125*   Lipid Profile: No results for input(s): CHOL, HDL, LDLCALC, TRIG, CHOLHDL, LDLDIRECT in the last 72 hours. Thyroid Function Tests: No results for input(s): TSH, T4TOTAL, FREET4, T3FREE, THYROIDAB in the last 72 hours. Anemia Panel: No results for input(s): VITAMINB12, FOLATE, FERRITIN, TIBC, IRON, RETICCTPCT in the last 72 hours. Urine analysis:    Component Value Date/Time   COLORURINE YELLOW 08/03/2017 0910   APPEARANCEUR CLEAR 08/03/2017 0910   LABSPEC 1.020 08/03/2017 0910   PHURINE 5.0 08/03/2017 0910   GLUCOSEU >=500 (A) 08/03/2017 0910     HGBUR SMALL (A) 08/03/2017 0910   BILIRUBINUR NEGATIVE 08/03/2017 0910   KETONESUR NEGATIVE 08/03/2017 0910   PROTEINUR NEGATIVE 08/03/2017 0910   UROBILINOGEN 0.2 06/08/2013 0424   NITRITE NEGATIVE 08/03/2017 0910   LEUKOCYTESUR LARGE (A) 08/03/2017 0910     Ripudeep Rai M.D. [facility] 08/11/2017, 10:14 AM  Pager: [phone] Between 7am to 7pm - call Pager - [phone]  After 7pm go to [email or web address] - password [email or web address]  Call night coverage person covering after 7pm

## 2017-08-12 DIAGNOSIS — R400 Somnolence: Secondary | ICD-10-CM

## 2017-08-12 DIAGNOSIS — R748 Abnormal levels of other serum enzymes: Secondary | ICD-10-CM

## 2017-08-12 LAB — GLUCOSE, CAPILLARY
GLUCOSE-CAPILLARY: 215 mg/dL — AB (ref 70–99)
GLUCOSE-CAPILLARY: 178 mg/dL — AB (ref 70–99)
GLUCOSE-CAPILLARY: 237 mg/dL — AB (ref 70–99)
Glucose-Capillary: 159 mg/dL — ABNORMAL HIGH (ref 70–99)
Glucose-Capillary: 217 mg/dL — ABNORMAL HIGH (ref 70–99)

## 2017-08-12 MED ORDER — INSULIN DETEMIR 100 UNIT/ML ~~LOC~~ SOLN: 13.0000 [IU] | Freq: Every day | SUBCUTANEOUS | 11 refills | Status: AC

## 2017-08-12 MED ORDER — ACETAMINOPHEN 325 MG PO TABS: 650.0000 mg | ORAL_TABLET | Freq: Four times a day (QID) | ORAL | Status: AC | PRN

## 2017-08-12 MED ORDER — INSULIN ASPART 100 UNIT/ML ~~LOC~~ SOLN: 0.0000 [IU] | Freq: Three times a day (TID) | SUBCUTANEOUS | 11 refills | Status: AC

## 2017-08-12 MED ORDER — INSULIN GLARGINE 100 UNIT/ML ~~LOC~~ SOLN
13.0000 [IU] | Freq: Every day | SUBCUTANEOUS | Status: DC
  Filled 2017-08-12: qty 0.13

## 2017-08-12 MED ORDER — POLYETHYLENE GLYCOL 3350 17 G PO PACK: 17.0000 g | PACK | Freq: Every day | ORAL | 0 refills | Status: AC | PRN

## 2017-08-12 NOTE — Progress Notes (Signed)
[facility]                                                                              Patient Demographics  Levi Robinson, is a 84 y.o. male, DOB - 12/15/1921, [id number]  Admit date - 07/30/2017   Admitting Physician Ejiroghene Wendall StadeE Emokpae, MD  Outpatient Primary MD for the patient is Georgianne Fickamachandran, Ajith, MD  Outpatient specialists:   LOS - 13  days   Medical records reviewed and are as summarized below:    Chief Complaint  Patient presents with  . Altered Mental Status       Brief summary   Patient is a 84 year old male with past medical history of diabetes mellitus, hypertension, recent acute subdural hematoma, coronary artery disease, CHF, paroxysmal A. fib who was brought to the emergency department by family with complaints of altered mental status.  Patient had been reported to have physically and cognitively declined, not able to ambulate or feed himself.  He had recent hospitalization from 5/24 to 5/28 after a fall at home during which CT head showed significant  subdural hematoma with midline shift.  Patient was discharged to home after that.  CT head done on this admission showed increased size of subdural hematoma and increased midline shift.  Neurosurgery, Dr. Franky Machoabbell was consulted in the ED, family declined surgery and opted for comfort care route.  Palliative care was consulted.  Currently awaiting for skilled nursing facility bed.      Assessment & Plan    Principal Problem: Acute metabolic encephalopathy secondary to increasing subdural hematoma -Family had discussed with neurosurgery, Dr. Franky Machoabbell and declined surgery, opted for comfort care route -Per neurosurgery, repeating CT head will not change any management.  Family requested skilled nursing facility placement. -Keppra was discontinued after discussion with neurosurgery. -Appears to be close to his baseline, no acute issues  Enterococcus UTI -Urine culture showed enterococcus,  patient has completed the course of antibiotics with ampicillin  Elevated troponin -No chest pain or shortness of breath, troponins remained flat, likely due to #1 -Comfort care status per family, no invasive interventions  Diabetes mellitus type 2,  -Hemoglobin A1c 9.4 on 5/19 - CBGs somewhat elevated, and 200s, increase Lantus to 13 units daily, continue sliding scale insulin   Generalized debility with failure to thrive -Palliative care consult was consulted and recommended follow-up at the skilled nursing facility after discharge.   Code Status: DNR/DNI DVT Prophylaxis:   SCD's Family Communication: Awaiting facility, no family member at the bedside,   Disposition Plan: Currently awaiting skilled nursing facility  Time Spent in minutes  25 minutes  Procedures:  None  Consultants:   Neurosurgery  Antimicrobials:   Completed course of ampicillin   Medications  Scheduled Meds: . insulin aspart  0-9 Units Subcutaneous TID WC  . insulin glargine  10 Units Subcutaneous QHS  . isosorbide mononitrate  30 mg Oral QPC lunch  . mouth rinse  15 mL Mouth Rinse BID   Continuous Infusions: PRN Meds:.acetaminophen **OR** acetaminophen, ondansetron **OR** ondansetron (ZOFRAN) IV, polyethylene glycol   Antibiotics   Anti-infectives (From admission, onward)   Start  Dose/Rate Route Frequency Ordered Stop   08/05/17 1800  ampicillin (OMNIPEN) 1 g in sodium chloride 0.9 % 100 mL IVPB  Status:  Discontinued     1 g 300 mL/hr over 20 Minutes Intravenous Every 6 hours 08/05/17 1415 08/07/17 1319   08/02/17 1600  vancomycin (VANCOCIN) 500 mg in sodium chloride 0.9 % 100 mL IVPB  Status:  Discontinued     500 mg 100 mL/hr over 60 Minutes Intravenous Every 24 hours 08/01/17 1442 08/02/17 0755   08/02/17 1600  ampicillin (OMNIPEN) 1 g in sodium chloride 0.9 % 100 mL IVPB  Status:  Discontinued     1 g 300 mL/hr over 20 Minutes Intravenous Every 8 hours 08/02/17 0755 08/05/17  1415   08/01/17 1500  vancomycin (VANCOCIN) IVPB 1000 mg/200 mL premix     1,000 mg 200 mL/hr over 60 Minutes Intravenous  Once 08/01/17 1442 08/01/17 1730   07/31/17 0030  cefTRIAXone (ROCEPHIN) 1 g in sodium chloride 0.9 % 100 mL IVPB  Status:  Discontinued     1 g 200 mL/hr over 30 Minutes Intravenous Every 24 hours 07/31/17 0030 08/01/17 1422        Subjective:   [name] was seen and examined today.  No acute issues overnight, appears to be close to his baseline mental status.  Denies any acute complaints.  No pain, shortness of breath, nausea vomiting or diarrhea.    Objective:   Vitals:   08/11/17 2021 08/11/17 2358 08/12/17 0452 08/12/17 0758  BP: 120/60 (!) 158/87 (!) 145/68 139/69  Pulse: 75 82 65 68  Resp: 18 20 16 16   Temp: 97.9 F (36.6 C) 98.2 F (36.8 C) 98.4 F (36.9 C) (!) 97.5 F (36.4 C)  TempSrc: Oral Oral Oral Oral  SpO2: 98% 100% 100% 100%  Weight:      Height:        Intake/Output Summary (Last 24 hours) at 08/12/2017 1038 Last data filed at 08/12/2017 0657 Gross per 24 hour  Intake 60 ml  Output 600 ml  Net -540 ml     Wt Readings from Last 3 Encounters:  07/30/17 63.2 kg (139 lb 5.3 oz)  06/30/17 72.6 kg (160 lb)  06/05/17 72.6 kg (160 lb)     Exam    General: Alert and oriented x2, NAD, pleasant  Eyes:   HEENT:    Cardiovascular: S1 S2 auscultated,  Regular rate and rhythm. No pedal edema b/l  Respiratory: Clear to auscultation bilaterally, no wheezing, rales or rhonchi  Gastrointestinal: Soft, nontender, nondistended, + bowel sounds  Ext: no pedal edema bilaterally  Neuro: no new deficits  Musculoskeletal: No digital cyanosis, clubbing  Skin: No rashes  Psych: appears close to baseline   Data Reviewed:  I have personally reviewed following labs and imaging studies  Micro Results No results found for this or any previous visit (from the past 240 hour(s)).  Radiology Reports Dg Chest 2 View  Result Date:  07/30/2017 CLINICAL DATA:  Altered mental status for the past 4 days. EXAM: CHEST - 2 VIEW COMPARISON:  Chest x-ray dated Jun 05, 2017. FINDINGS: Stable cardiac silhouette, at the upper limits of normal in size. Normal pulmonary vascularity. Stable left greater than right bibasilar atelectasis/scarring. No focal consolidation, pleural effusion, or pneumothorax. No acute osseous abnormality. IMPRESSION: Bibasilar atelectasis/scarring, unchanged. No active cardiopulmonary disease. Electronically Signed   By: Obie Dredge M.D.   On: 07/30/2017 16:02   Ct Head Wo Contrast  Result Date: 07/30/2017 CLINICAL  DATA:  Altered mental status. EXAM: CT HEAD WITHOUT CONTRAST TECHNIQUE: Contiguous axial images were obtained from the base of the skull through the vertex without intravenous contrast. COMPARISON:  CT head dated June 15, 2017. FINDINGS: Brain: Enlarging, predominantly isodense right subdural hematoma, now measuring up to 2.8 cm in maximal diameter, previously 2.1 cm. There are areas of layering hyperdensity posteriorly, consistent with recent hemorrhage. Increased right to left midline shift, now measuring 8 mm, previously 3 mm. There is near complete effacement of the right lateral ventricle with minimally increased entrapment of the left lateral ventricle. Worsening right cerebral hemisphere sulcal effacement. Basal cisterns remain patent. Resolved left subdural hygroma. No evidence of acute infarction. Stable chronic microvascular ischemic changes. Vascular: Atherosclerotic vascular calcification of the carotid siphons. No hyperdense vessel. Skull: Normal. Negative for fracture or focal lesion. Sinuses/Orbits: No acute finding. Other: None. IMPRESSION: 1. Enlarging right acute on chronic subdural hematoma, now measuring 2.8 cm in maximal diameter, previously 2.1 cm. Resultant increased right-to-left midline shift, now measuring 8 mm, previously 3 mm. 2. Increased right cerebral hemisphere sulcal effacement  with near complete effacement of the right lateral ventricle and minimally increased entrapment of the left lateral ventricle. 3. Resolved left subdural hygroma. Critical Value/emergent results were called by telephone at the time of interpretation on 07/30/2017 at 3:51 pm to Dr. Arthor Captain , who verbally acknowledged these results. Electronically Signed   By: Obie Dredge M.D.   On: 07/30/2017 15:53    Lab Data:  CBC: No results for input(s): WBC, NEUTROABS, HGB, HCT, MCV, PLT in the last 168 hours. Basic Metabolic Panel: Recent Labs  Lab 08/05/17 1322  NA 138  K 3.9  CL 111  CO2 19*  GLUCOSE 276*  BUN 12  CREATININE 0.87  CALCIUM 8.0*   GFR: Estimated Creatinine Clearance: 47.4 mL/min (by C-G formula based on SCr of 0.87 mg/dL). Liver Function Tests: No results for input(s): AST, ALT, ALKPHOS, BILITOT, PROT, ALBUMIN in the last 168 hours. No results for input(s): LIPASE, AMYLASE in the last 168 hours. No results for input(s): AMMONIA in the last 168 hours. Coagulation Profile: No results for input(s): INR, PROTIME in the last 168 hours. Cardiac Enzymes: No results for input(s): CKTOTAL, CKMB, CKMBINDEX, TROPONINI in the last 168 hours. BNP (last 3 results) No results for input(s): PROBNP in the last 8760 hours. HbA1C: No results for input(s): HGBA1C in the last 72 hours. CBG: Recent Labs  Lab 08/11/17 1139 08/11/17 1630 08/11/17 2118 08/12/17 0650 08/12/17 0951  GLUCAP 153* 215* 293* 178* 159*   Lipid Profile: No results for input(s): CHOL, HDL, LDLCALC, TRIG, CHOLHDL, LDLDIRECT in the last 72 hours. Thyroid Function Tests: No results for input(s): TSH, T4TOTAL, FREET4, T3FREE, THYROIDAB in the last 72 hours. Anemia Panel: No results for input(s): VITAMINB12, FOLATE, FERRITIN, TIBC, IRON, RETICCTPCT in the last 72 hours. Urine analysis:    Component Value Date/Time   COLORURINE YELLOW 08/03/2017 0910   APPEARANCEUR CLEAR 08/03/2017 0910   LABSPEC 1.020  08/03/2017 0910   PHURINE 5.0 08/03/2017 0910   GLUCOSEU >=500 (A) 08/03/2017 0910   HGBUR SMALL (A) 08/03/2017 0910   BILIRUBINUR NEGATIVE 08/03/2017 0910   KETONESUR NEGATIVE 08/03/2017 0910   PROTEINUR NEGATIVE 08/03/2017 0910   UROBILINOGEN 0.2 06/08/2013 0424   NITRITE NEGATIVE 08/03/2017 0910   LEUKOCYTESUR LARGE (A) 08/03/2017 0910     Tessa Seaberry M.D. [facility] 08/12/2017, 10:38 AM  Pager: [phone] Between 7am to 7pm - call Pager - [phone]  After  7pm go to [email or web address] - password [email or web address]  Call night coverage person covering after 7pm

## 2017-08-12 NOTE — Progress Notes (Signed)
Occupational Therapy Treatment Patient Details Name: Levi Robinson MRN: [id number] DOB: 02/25/1921 Today's Date: 08/12/2017    History of present illness Patient is a 84 y/o male initially presenting to the ED on 07/30/17 due to AMS. Of note, recent hospital admission 5/24- 06/09/17 after a fall at home with CT head showing significant right subdural hematoma with right greater than left shift. Most recent MRI revealing Enlarging right acute on chronic subdural hematoma, now measuring 2.8 cm in maximal diameter, previously 2.1 cm. Resultant increased right-to-left midline shift, now measuring 8 mm, previously 3 mm. PMH significant for DM,  HTN, recent acute subdural hematoma, carotid and coronary  disease, CHF, paroxysmal atrial fibrillation.   OT comments  Pt with po intake of yogurt in chair with position change. Pt with no verbal responses. Pt does open eyes to name call and reaching with L UE for places to support self showing attempts at righting reaction.    Follow Up Recommendations  SNF;Supervision/Assistance - 24 hour    Equipment Recommendations  Other (comment)    Recommendations for Other Services PT consult    Precautions / Restrictions Precautions Precautions: Fall       Mobility Bed Mobility Overal bed mobility: Needs Assistance Bed Mobility: Supine to Sit     Supine to sit: +2 for physical assistance;Max assist;HOB elevated     General bed mobility comments: pt max (A) at EOB due to posterior lean. pt with eyes open due to chang eo f position. pt startles and reaching with L UE for environmental support.   Transfers Overall transfer level: Needs assistance   Transfers: Stand Pivot Transfers;Sit to/from Stand Sit to Stand: +2 physical assistance;Max assist Stand pivot transfers: +2 safety/equipment;+2 physical assistance;Max assist       General transfer comment: pt with extensor tone with static standing. pt needs (A) to pivot to chair on Right side     Balance Overall balance assessment: Needs assistance   Sitting balance-Leahy Scale: Poor       Standing balance-Leahy Scale: Poor                             ADL either performed or assessed with clinical judgement   ADL Overall ADL's : Needs assistance/impaired Eating/Feeding: Total assistance Eating/Feeding Details (indicate cue type and reason): pt taking 4 bites of yogurt with sips of water from cup between bites. pt awake and opening mouth and then closes eyes and stops engagement in task.  Grooming: Wash/dry face;Maximal assistance Grooming Details (indicate cue type and reason): pt following command to wash face. Pt raises R hand to face and then wipes eyes and leaves cloth on face. pt with name call and opens eyes under the wash cloth but doesnot attempt to remove the cloth.                  Toilet Transfer: +2 for physical assistance;Maximal assistance;Stand-pivot             General ADL Comments: pt supine on arrival and transfered to chair. pt with full body extension and (A) with transfer due to able to maintain static standing posture but does not initiate pivot.      Vision       Perception     Praxis      Cognition Arousal/Alertness: Awake/alert Behavior During Therapy: Flat affect Overall Cognitive Status: Impaired/Different from baseline  Current Attention Level: Focused                    Exercises     Shoulder Instructions       General Comments      Pertinent Vitals/ Pain       Pain Assessment: No/denies pain  Home Living                                          Prior Functioning/Environment              Frequency  Min 1X/week        Progress Toward Goals  OT Goals(current goals can now be found in the care plan section)  Progress towards OT goals: Progressing toward goals  Acute Rehab OT Goals Patient Stated Goal: none stated OT Goal Formulation:  Patient unable to participate in goal setting Time For Goal Achievement: 08/16/17 Potential to Achieve Goals: Good ADL Goals Pt Will Perform Grooming: with mod assist;sitting Pt Will Perform Upper Body Dressing: with mod assist;sitting Pt Will Transfer to Toilet: ambulating;with min assist;regular height toilet Pt Will Perform Toileting - Clothing Manipulation and hygiene: with supervision;sit to/from stand Pt/caregiver will Perform Home Exercise Program: Increased ROM;Increased strength;Both right and left upper extremity;With minimal assist Additional ADL Goal #1: Caregiver will demonstrate understanding of PROM strentches for BUEs with supervision Additional ADL Goal #2: Pt will complete bed mobility with overall supervision to sit at EOB in preparation for ADL participation.  Plan Discharge plan remains appropriate    Co-evaluation                 AM-PAC PT "6 Clicks" Daily Activity     Outcome Measure   Help from another person eating meals?: Total Help from another person taking care of personal grooming?: Total Help from another person toileting, which includes using toliet, bedpan, or urinal?: Total Help from another person bathing (including washing, rinsing, drying)?: Total Help from another person to put on and taking off regular upper body clothing?: Total Help from another person to put on and taking off regular lower body clothing?: Total 6 Click Score: 6    End of Session Equipment Utilized During Treatment: Gait belt  OT Visit Diagnosis: Other abnormalities of gait and mobility (R26.89);Muscle weakness (generalized) (M62.81);Other symptoms and signs involving cognitive function   Activity Tolerance Patient tolerated treatment well   Patient Left in bed;with call bell/phone within reach;with bed alarm set   Nurse Communication Mobility status;Precautions        Time: 4098-1191 OT Time Calculation (min): 19 min  Charges: OT General Charges $OT Visit:  1 Visit OT Treatments $Therapeutic Activity: 8-22 mins   Mateo Flow   OTR/L Pager: [phone] Office: [phone] .    Boone Master B 08/12/2017, 4:06 PM

## 2017-08-12 NOTE — Consult Note (Addendum)
   [facility]   08/12/2017  Burr MedicoBernice L Antonacci 07/27/1921 [id number]   [facility] Care Management follow up.  Chart reviewed. Noted patient will discharge to [facility] [place]. Spoke with inpatient RNCM to confirm.   Noted Palliative follow up at SNF is on dc summary.  Will update [facility] RN Health Coach of disposition plans.    Raiford NobleAtika Suriah Peragine, MSN-Ed, RN,BSN [facility] Care [facility] Hospital Liaison [phone]

## 2017-08-12 NOTE — Clinical Social Work Placement (Signed)
Nurse to call report to [phone], Room 216b  Transport set for 4:30 PM.     CLINICAL SOCIAL WORK PLACEMENT  NOTE  Date:  08/12/2017  Patient Details  Name: Levi MedicoBernice L Lippmann MRN: [id number] Date of Birth: 10/31/1921  Clinical Social Work is seeking post-discharge placement for this patient at the Skilled  Nursing Facility level of care (*CSW will initial, date and re-position this form in  chart as items are completed):  Yes   Patient/family provided with [facility] Clinical Social Work Department's list of facilities offering this level of care within the geographic area requested by the patient (or if unable, by the patient's family).  Yes   Patient/family informed of their freedom to choose among providers that offer the needed level of care, that participate in Medicare, Medicaid or managed care program needed by the patient, have an available bed and are willing to accept the patient.  Yes   Patient/family informed of [facility]'s ownership interest in [facility] and [facility], as well as of the fact that they are under no obligation to receive care at these facilities.  PASRR submitted to EDS on 08/05/17     PASRR number received on 08/05/17     Existing PASRR number confirmed on       FL2 transmitted to all facilities in geographic area requested by pt/family on 08/05/17     FL2 transmitted to all facilities within larger geographic area on       Patient informed that his/her managed care company has contracts with or will negotiate with certain facilities, including the following:        Yes   Patient/family informed of bed offers received.  Patient chooses bed at [facility]     Physician recommends and patient chooses bed at      Patient to be transferred to [facility] on 08/12/17.  Patient to be transferred to facility by PTAR     Patient family notified on 08/12/17 of transfer.  Name of family member notified:  [name]     PHYSICIAN     Additional Comment:    _______________________________________________ Baldemar LenisElizabeth M Exodus Kutzer, LCSW 08/12/2017, 1:15 PM

## 2017-08-12 NOTE — Care Management Note (Signed)
Case Management Note  Patient Details  Name: Burr MedicoBernice L Froemming MRN: [id number] Date of Birth: 01/29/1921  Subjective/Objective:                    Action/Plan: Pt discharging [facility] today. CM signing off.   Expected Discharge Date:  08/12/17               Expected Discharge Plan:  Skilled Nursing Facility  In-House Referral:  Clinical Social Work  Discharge planning Services     Post Acute Care Choice:    Choice offered to:     DME Arranged:    DME Agency:     HH Arranged:    HH Agency:     Status of Service:  Completed, signed off  If discussed at [employer] Meetings, dates discussed:    Additional Comments:  Kermit BaloKelli F Cosette Prindle, RN 08/12/2017, 1:59 PM

## 2017-08-12 NOTE — Progress Notes (Signed)
Patient discharged to skilled nursing facility. Discharge instructions discussed with SNF facility staff. IV removed and patient transported from unit via PTAR. Family also notified that patient has left the hospital. Lawson RadarHeather M Nahuel Wilbert

## 2017-08-12 NOTE — Discharge Summary (Addendum)
Physician Discharge Summary   Patient ID: Levi Robinson MRN: [id number] DOB/AGE: 82/08/1921 82 y.o.  Admit date: 07/30/2017 Discharge date: 08/12/2017  Primary Care Physician:  Georgianne Fickamachandran, Ajith, MD   Recommendations for Outpatient Follow-up:  1. Follow up with PCP in 2 weeks or as needed 2. Fall precautions 3. Palliative care to follow at the skilled nursing facility  Home Health: Patient is being discharged to skilled nursing facility   Discharge Condition: Guarded CODE STATUS: DNR Diet recommendation: Dysphagia 3 mechanical soft diet with thin liquids, carb modified   Discharge Diagnoses:    . Acute metabolic encephalopathy secondary to increasing subdural hematoma . CAD (coronary artery disease) . Carotid arterial disease (HCC) . Atrial fibrillation with RVR (HCC) . Enterococcus UTI . Subdural hematoma, acute (HCC) . Uncontrolled diabetes mellitus (HCC) . Benign essential HTN . Nonproliferative diabetic retinopathy (HCC) Generalized debility with failure to thrive   Elevated troponin   Consults: Neurosurgery Palliative care    Allergies:   Allergies  Allergen Reactions  . Ativan [Lorazepam] Other (See Comments)    agitation  . Fentanyl Nausea And Vomiting     DISCHARGE MEDICATIONS: Allergies as of 08/12/2017      Reactions   Ativan [lorazepam] Other (See Comments)   agitation   Fentanyl Nausea And Vomiting      Medication List    STOP taking these medications   carvedilol 12.5 MG tablet Commonly known as:  COREG   diphenhydrAMINE 25 MG tablet Commonly known as:  BENADRYL   furosemide 40 MG tablet Commonly known as:  LASIX   levETIRAcetam 500 MG tablet Commonly known as:  KEPPRA   levothyroxine 50 MCG tablet Commonly known as:  SYNTHROID, LEVOTHROID   nitroGLYCERIN 0.4 MG SL tablet Commonly known as:  NITROSTAT   pantoprazole 40 MG tablet Commonly known as:  PROTONIX   potassium chloride 10 MEQ tablet Commonly known as:   K-DUR   simvastatin 40 MG tablet Commonly known as:  ZOCOR   traZODone 50 MG tablet Commonly known as:  DESYREL     TAKE these medications   acetaminophen 325 MG tablet Commonly known as:  TYLENOL Take 2 tablets (650 mg total) by mouth every 6 (six) hours as needed for mild pain (or Fever >/= 101).   insulin aspart 100 UNIT/ML injection Commonly known as:  novoLOG Inject 0-9 Units into the skin 3 (three) times daily with meals. Sliding scale CBG 70 - 120: 0 units CBG 121 - 150: 1 unit,  CBG 151 - 200: 2 units,  CBG 201 - 250: 3 units,  CBG 251 - 300: 5 units,  CBG 301 - 350: 7 units,  CBG 351 - 400: 9 units   CBG > 400: 9 units and notify your MD   insulin detemir 100 UNIT/ML injection Commonly known as:  LEVEMIR Inject 0.13 mLs (13 Units total) into the skin at bedtime. What changed:    how much to take  when to take this  additional instructions   isosorbide mononitrate 30 MG 24 hr tablet Commonly known as:  IMDUR Take 30 mg by mouth daily after lunch.   ondansetron 4 MG disintegrating tablet Commonly known as:  ZOFRAN ODT 4mg  ODT q4 hours prn nausea/vomit What changed:    how much to take  how to take this  when to take this  reasons to take this  additional instructions   polyethylene glycol packet Commonly known as:  MIRALAX / GLYCOLAX Take 17 g by mouth daily as  needed for mild constipation.        Brief H and P: For complete details please refer to admission H and P, but in briefPatient is a 84 year old male with past medical history of diabetes mellitus, hypertension, recent acute subdural hematoma, coronary artery disease, CHF, paroxysmal A. fib who was brought to the emergency department by family with complaints of altered mental status. Patient had been reported to have physically and cognitively declined, not able to ambulate or feed himself. He had recent hospitalization from 5/24 to 5/28 after a fall at home during which CT head showed  significant subdural hematoma with midline shift. Patient was discharged to home after that. CT head done on this admission showed increased size of subdural hematoma and increased midline shift. Neurosurgery, Dr. Franky Macho was consulted in the ED, family declined surgery and opted for comfort care route.  Palliative care was consulted.  Currently awaiting for skilled nursing facility bed.   Hospital Course:  Acute metabolic encephalopathy secondary to increasing subdural hematoma -Family had discussed with neurosurgery, Dr. Franky Macho and declined surgery, opted for comfort care route -Per neurosurgery, repeating CT head will not change any management.  Family requested skilled nursing facility placement. -After family's concern that Keppra was making the patient more lethargic, Dr. Sunnie Nielsen discussed with Dr. Franky Macho and was okay to discontinue Keppra as no need for antiseizure medications at this point.  Enterococcus UTI -Urine culture showed enterococcus, patient has completed the course of antibiotics with ampicillin  Elevated troponin -No chest pain or shortness of breath, troponins remained flat, likely due to #1 -Comfort care status per family, no invasive interventions  Diabetes mellitus type 2,  -Hemoglobin A1c 9.4 on 5/19 - CBGs somewhat elevated, and 200s, increase Lantus to 13 units daily, continue sliding scale insulin   Generalized debility with failure to thrive -Palliative care consult was consulted and recommended follow-up at the skilled nursing facility after discharge.      Day of Discharge S: No acute issues overnight, close to his baseline, denies any acute complaints.  BP 139/69 (BP Location: Left Arm)   Pulse 68   Temp (!) 97.5 F (36.4 C) (Oral)   Resp 16   Ht 5\' 10"  (1.778 m)   Wt 63.2 kg (139 lb 5.3 oz)   SpO2 100%   BMI 19.99 kg/m   Physical Exam: General: Alert and oriented x2, NAD HEENT: anicteric sclera, pupils reactive to light and  accommodation CVS: S1-S2 clear no murmur rubs or gallops Chest: clear to auscultation bilaterally, no wheezing rales or rhonchi Abdomen: soft nontender, nondistended, normal bowel sounds Extremities: no cyanosis, clubbing or edema noted bilaterally Neuro: appears close to his baseline   The results of significant diagnostics from this hospitalization (including imaging, microbiology, ancillary and laboratory) are listed below for reference.      Procedures/Studies:  Dg Chest 2 View  Result Date: 07/30/2017 CLINICAL DATA:  Altered mental status for the past 4 days. EXAM: CHEST - 2 VIEW COMPARISON:  Chest x-ray dated Jun 05, 2017. FINDINGS: Stable cardiac silhouette, at the upper limits of normal in size. Normal pulmonary vascularity. Stable left greater than right bibasilar atelectasis/scarring. No focal consolidation, pleural effusion, or pneumothorax. No acute osseous abnormality. IMPRESSION: Bibasilar atelectasis/scarring, unchanged. No active cardiopulmonary disease. Electronically Signed   By: Obie Dredge M.D.   On: 07/30/2017 16:02   Ct Head Wo Contrast  Result Date: 07/30/2017 CLINICAL DATA:  Altered mental status. EXAM: CT HEAD WITHOUT CONTRAST TECHNIQUE: Contiguous axial images were obtained  from the base of the skull through the vertex without intravenous contrast. COMPARISON:  CT head dated June 15, 2017. FINDINGS: Brain: Enlarging, predominantly isodense right subdural hematoma, now measuring up to 2.8 cm in maximal diameter, previously 2.1 cm. There are areas of layering hyperdensity posteriorly, consistent with recent hemorrhage. Increased right to left midline shift, now measuring 8 mm, previously 3 mm. There is near complete effacement of the right lateral ventricle with minimally increased entrapment of the left lateral ventricle. Worsening right cerebral hemisphere sulcal effacement. Basal cisterns remain patent. Resolved left subdural hygroma. No evidence of acute infarction.  Stable chronic microvascular ischemic changes. Vascular: Atherosclerotic vascular calcification of the carotid siphons. No hyperdense vessel. Skull: Normal. Negative for fracture or focal lesion. Sinuses/Orbits: No acute finding. Other: None. IMPRESSION: 1. Enlarging right acute on chronic subdural hematoma, now measuring 2.8 cm in maximal diameter, previously 2.1 cm. Resultant increased right-to-left midline shift, now measuring 8 mm, previously 3 mm. 2. Increased right cerebral hemisphere sulcal effacement with near complete effacement of the right lateral ventricle and minimally increased entrapment of the left lateral ventricle. 3. Resolved left subdural hygroma. Critical Value/emergent results were called by telephone at the time of interpretation on 07/30/2017 at 3:51 pm to Dr. Arthor Captain , who verbally acknowledged these results. Electronically Signed   By: Obie Dredge M.D.   On: 07/30/2017 15:53       LAB RESULTS: Basic Metabolic Panel: Recent Labs  Lab 08/05/17 1322  NA 138  K 3.9  CL 111  CO2 19*  GLUCOSE 276*  BUN 12  CREATININE 0.87  CALCIUM 8.0*   Liver Function Tests: No results for input(s): AST, ALT, ALKPHOS, BILITOT, PROT, ALBUMIN in the last 168 hours. No results for input(s): LIPASE, AMYLASE in the last 168 hours. No results for input(s): AMMONIA in the last 168 hours. CBC: No results for input(s): WBC, NEUTROABS, HGB, HCT, MCV, PLT in the last 168 hours. Cardiac Enzymes: No results for input(s): CKTOTAL, CKMB, CKMBINDEX, TROPONINI in the last 168 hours. BNP: Invalid input(s): POCBNP CBG: Recent Labs  Lab 08/12/17 0650 08/12/17 0951  GLUCAP 178* 159*      Disposition and Follow-up: Discharge Instructions    Diet Carb Modified   Complete by:  As directed    Increase activity slowly   Complete by:  As directed        DISPOSITION: Skilled nursing facility   DISCHARGE FOLLOW-UP Follow-up Information    Georgianne Fick, MD. Schedule an  appointment as soon as possible for a visit.   Specialty:  Internal Medicine Why:  as needed  Contact information: [address] [place] [address] [phone]        Kathleene Hazel, MD .   Specialty:  Cardiology Contact information: [address] [place] [address] [phone]            Time coordinating discharge:  35 minutes  Signed:   Thad Ranger M.D. [facility] 08/12/2017, 11:58 AM Pager: [phone]   Coding query addendum: Diabetes mellitus type 2, IDDM uncontrolled with hyperglycemia   Sidi Dzikowski M.D. [facility] 08/14/2017, 6:30 AM  Pager: [phone]

## 2017-08-14 ENCOUNTER — Other Ambulatory Visit: Payer: Self-pay | Admitting: *Deleted

## 2017-08-14 NOTE — Patient Outreach (Signed)
[facility]) Care Management  08/14/2017  Burr MedicoBernice L Allegretto 11/01/1921 [id number]   RN Health Case Closure  Referral Date:06/16/2017 Referral Source:[facility] Primary Care Navigator Reason for Referral:"further assistance in managing chronic health issues and provide information/health education on ways to further manage DM and other health issues after discharge" Insurance:[employer] Medicare   Outreach Attempt:  Notified by Hospital Liaison patient discharged to [facility] [place].  Patient is to have Palliative Care following at the facility.  Plan: RN Health Coach will close case based on patient discharging to SNF. RN Health Coach will send case closure notification to primary care provider. RN Health Coach will send case closure notification to patient and granddaughter.  Rhae LernerFarrah Miquan Tandon RN [facility] Care Management  RN Health Coach [phone] [email or web address]

## 2017-08-20 ENCOUNTER — Encounter

## 2017-08-20 ENCOUNTER — Ambulatory Visit: Payer: Self-pay | Admitting: Neurology

## 2017-08-24 ENCOUNTER — Ambulatory Visit: Payer: Medicare Other | Admitting: *Deleted

## 2017-12-13 DEATH — deceased

## 2018-08-21 IMAGING — CT CT HEAD W/O CM
3 series · 15 of 47 positions shown, 18 images · non-contrast
Comparison: CT head dated June 15, 2017.

CLINICAL DATA: Altered mental status.

EXAM:
CT HEAD WITHOUT CONTRAST
TECHNIQUE: Contiguous axial images were obtained from the base of the skull
through the vertex without intravenous contrast.

[Series 2: head wo · axial · 0.47mm/px · z∈[-90,+56]mm · 9 of 35 slices shown, 12 images]
[im 3/35  brain]
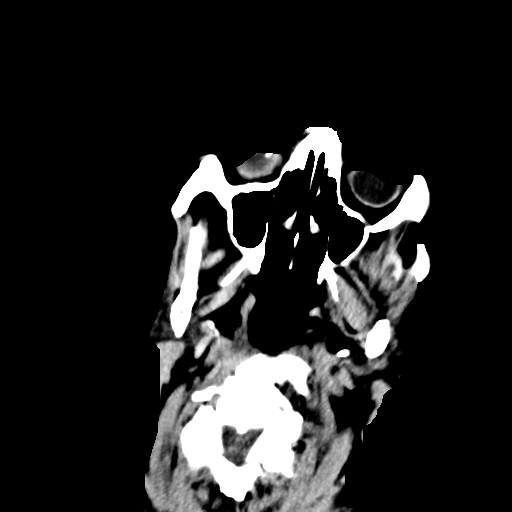
[im 3/35  bone]
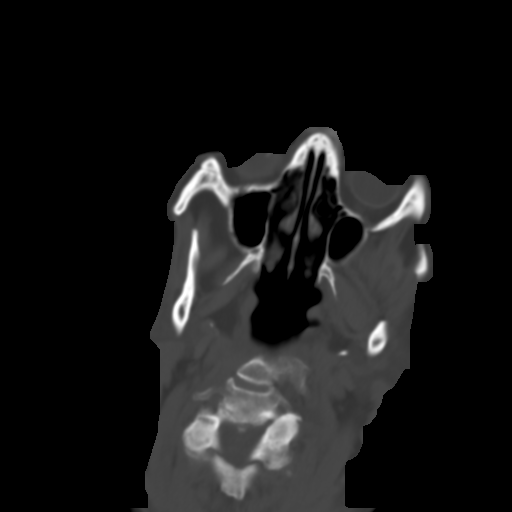
[im 6/35  brain]
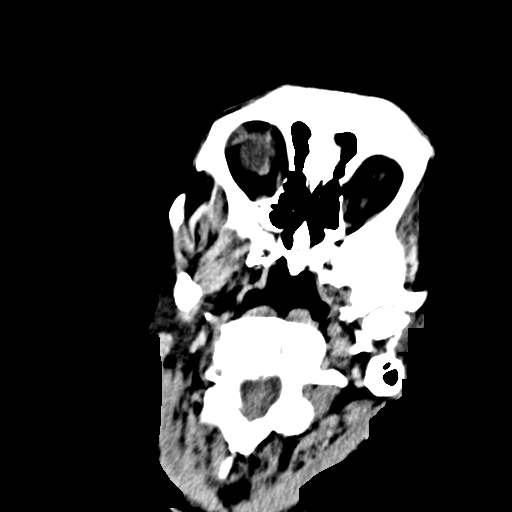
[im 10/35  brain]
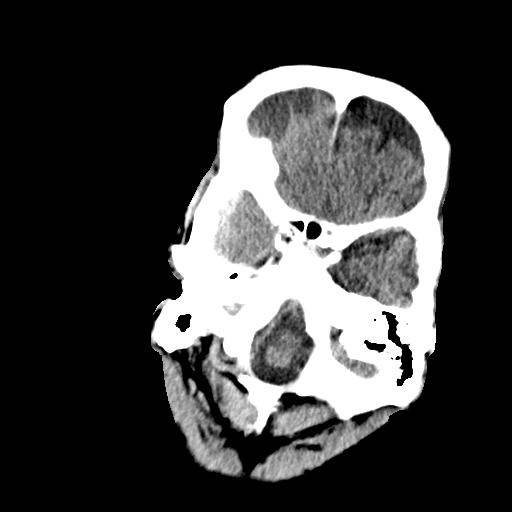
[im 13/35  brain]
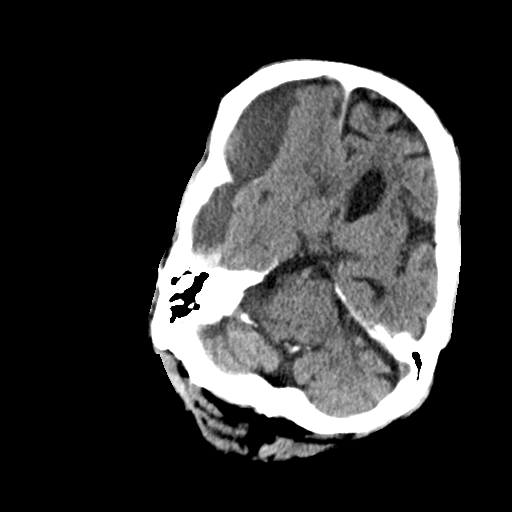
[im 18/35  brain]
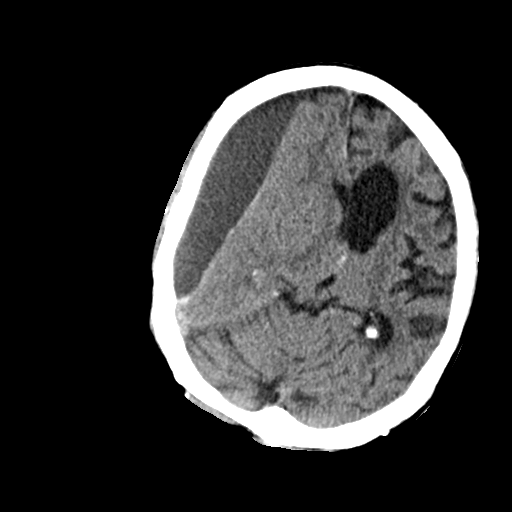
[im 18/35  bone]
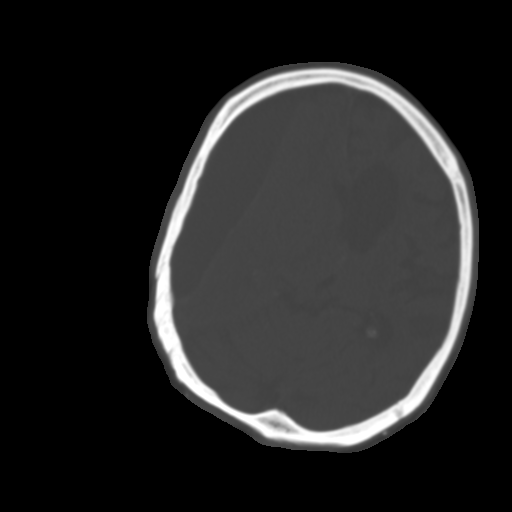
[im 22/35  brain]
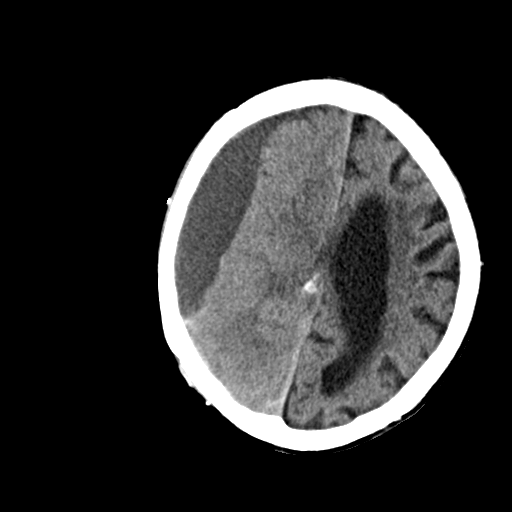
[im 25/35  brain]
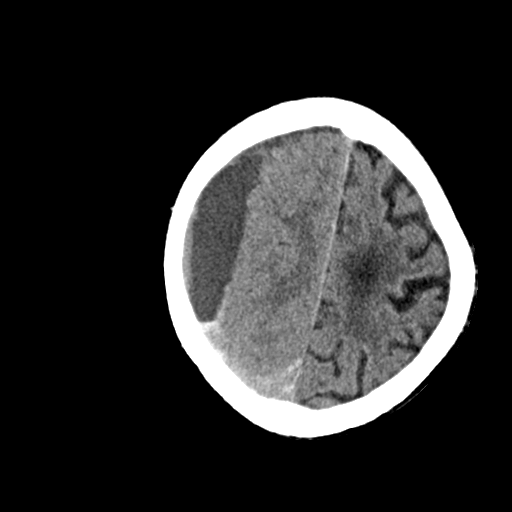
[im 29/35  brain]
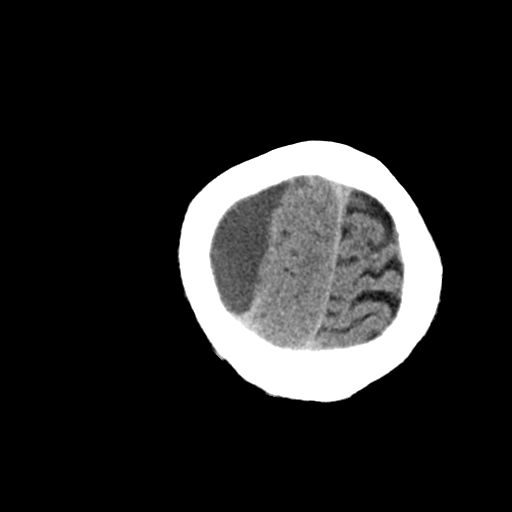
[im 32/35  brain]
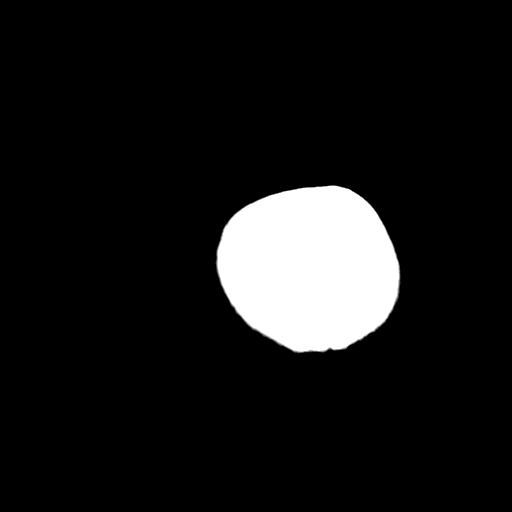
[im 32/35  bone]
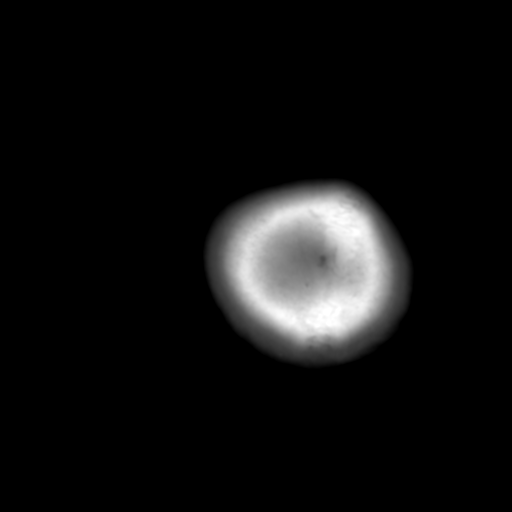

[Series 4: coronal soft · coronal · 0.33mm/px · 3 of 67 slices shown]
[im 23/67  brain]
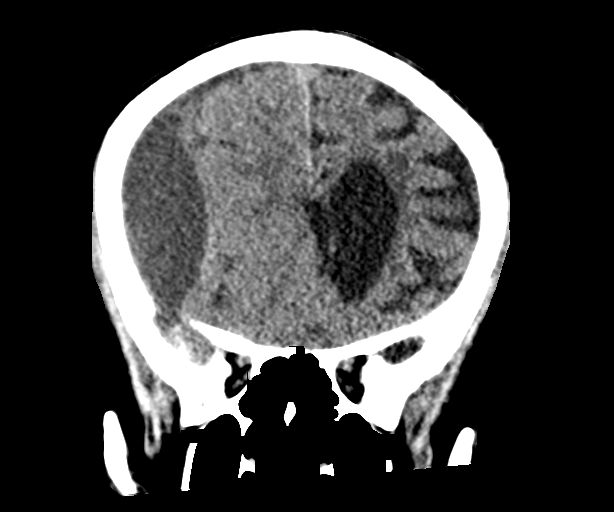
[im 30/67  brain]
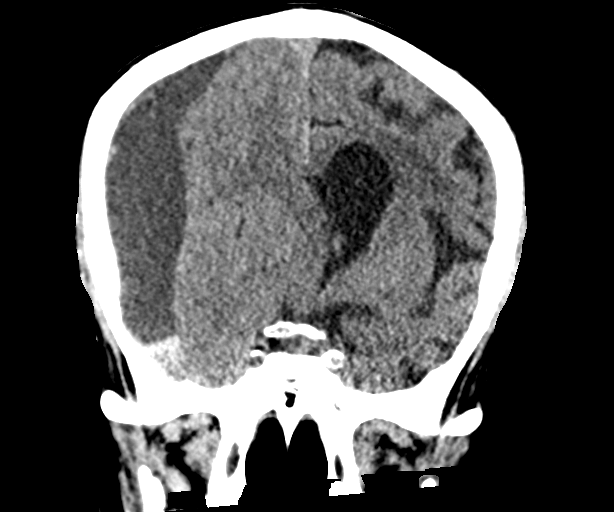
[im 37/67  brain]
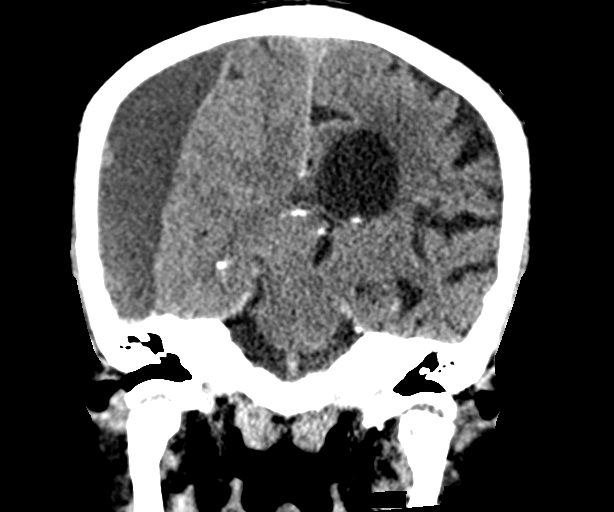

[Series 5: sag soft · sagittal · 0.33mm/px · 3 of 67 slices shown]
[im 23/67  brain]
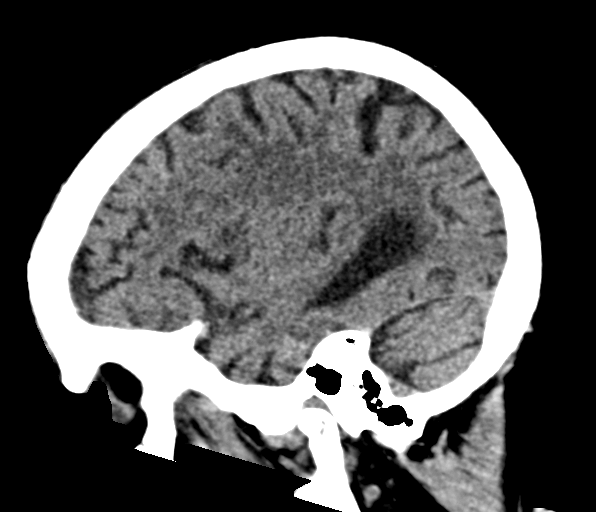
[im 34/67  brain]
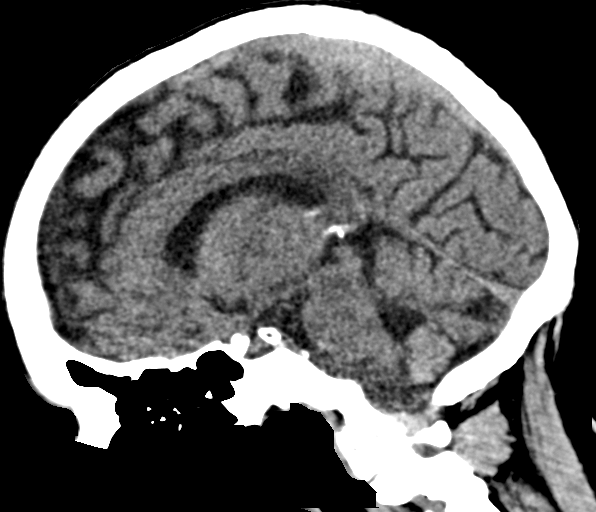
[im 45/67  brain]
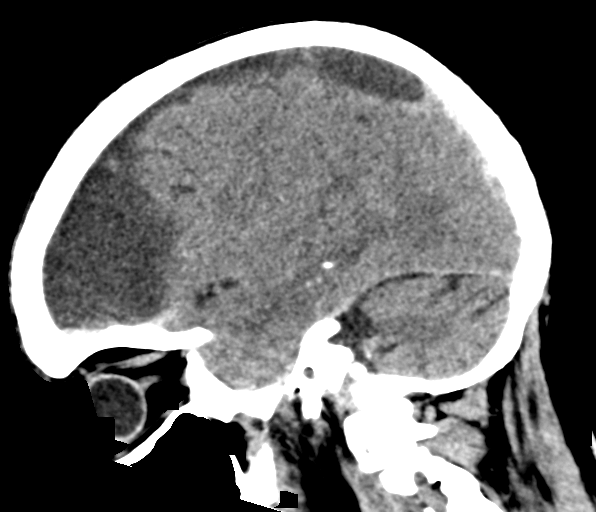

[15 of 47 positions shown; findings below may reference images not displayed]

FINDINGS: Brain: Enlarging, predominantly isodense right subdural hematoma,
now measuring up to 2.8 cm in maximal diameter, previously 2.1 cm.
There are areas of layering hyperdensity posteriorly, consistent
with recent hemorrhage. Increased right to left midline shift, now
measuring 8 mm, previously 3 mm. There is near complete effacement
of the right lateral ventricle with minimally increased entrapment
of the left lateral ventricle. Worsening right cerebral hemisphere
sulcal effacement. Basal cisterns remain patent.

Resolved left subdural hygroma. No evidence of acute infarction.
Stable chronic microvascular ischemic changes.

Vascular: Atherosclerotic vascular calcification of the carotid
siphons. No hyperdense vessel.

Skull: Normal. Negative for fracture or focal lesion.

Sinuses/Orbits: No acute finding.

Other: None.
IMPRESSION: 1. Enlarging right acute on chronic subdural hematoma, now measuring
2.8 cm in maximal diameter, previously 2.1 cm. Resultant increased
right-to-left midline shift, now measuring 8 mm, previously 3 mm.
2. Increased right cerebral hemisphere sulcal effacement with near
complete effacement of the right lateral ventricle and minimally
increased entrapment of the left lateral ventricle.
3. Resolved left subdural hygroma.

Critical Value/emergent results were called by telephone at the time
of interpretation on 07/30/2017 at [DATE] to Dr. PENI KHANZA ,
who verbally acknowledged these results.

## 2018-08-21 IMAGING — DX DG CHEST 2V
2 series · 2 of 2 positions shown · non-contrast
Comparison: Chest x-ray dated June 05, 2017.

CLINICAL DATA: Altered mental status for the past 4 days.

EXAM:
CHEST - 2 VIEW

[chest lat]
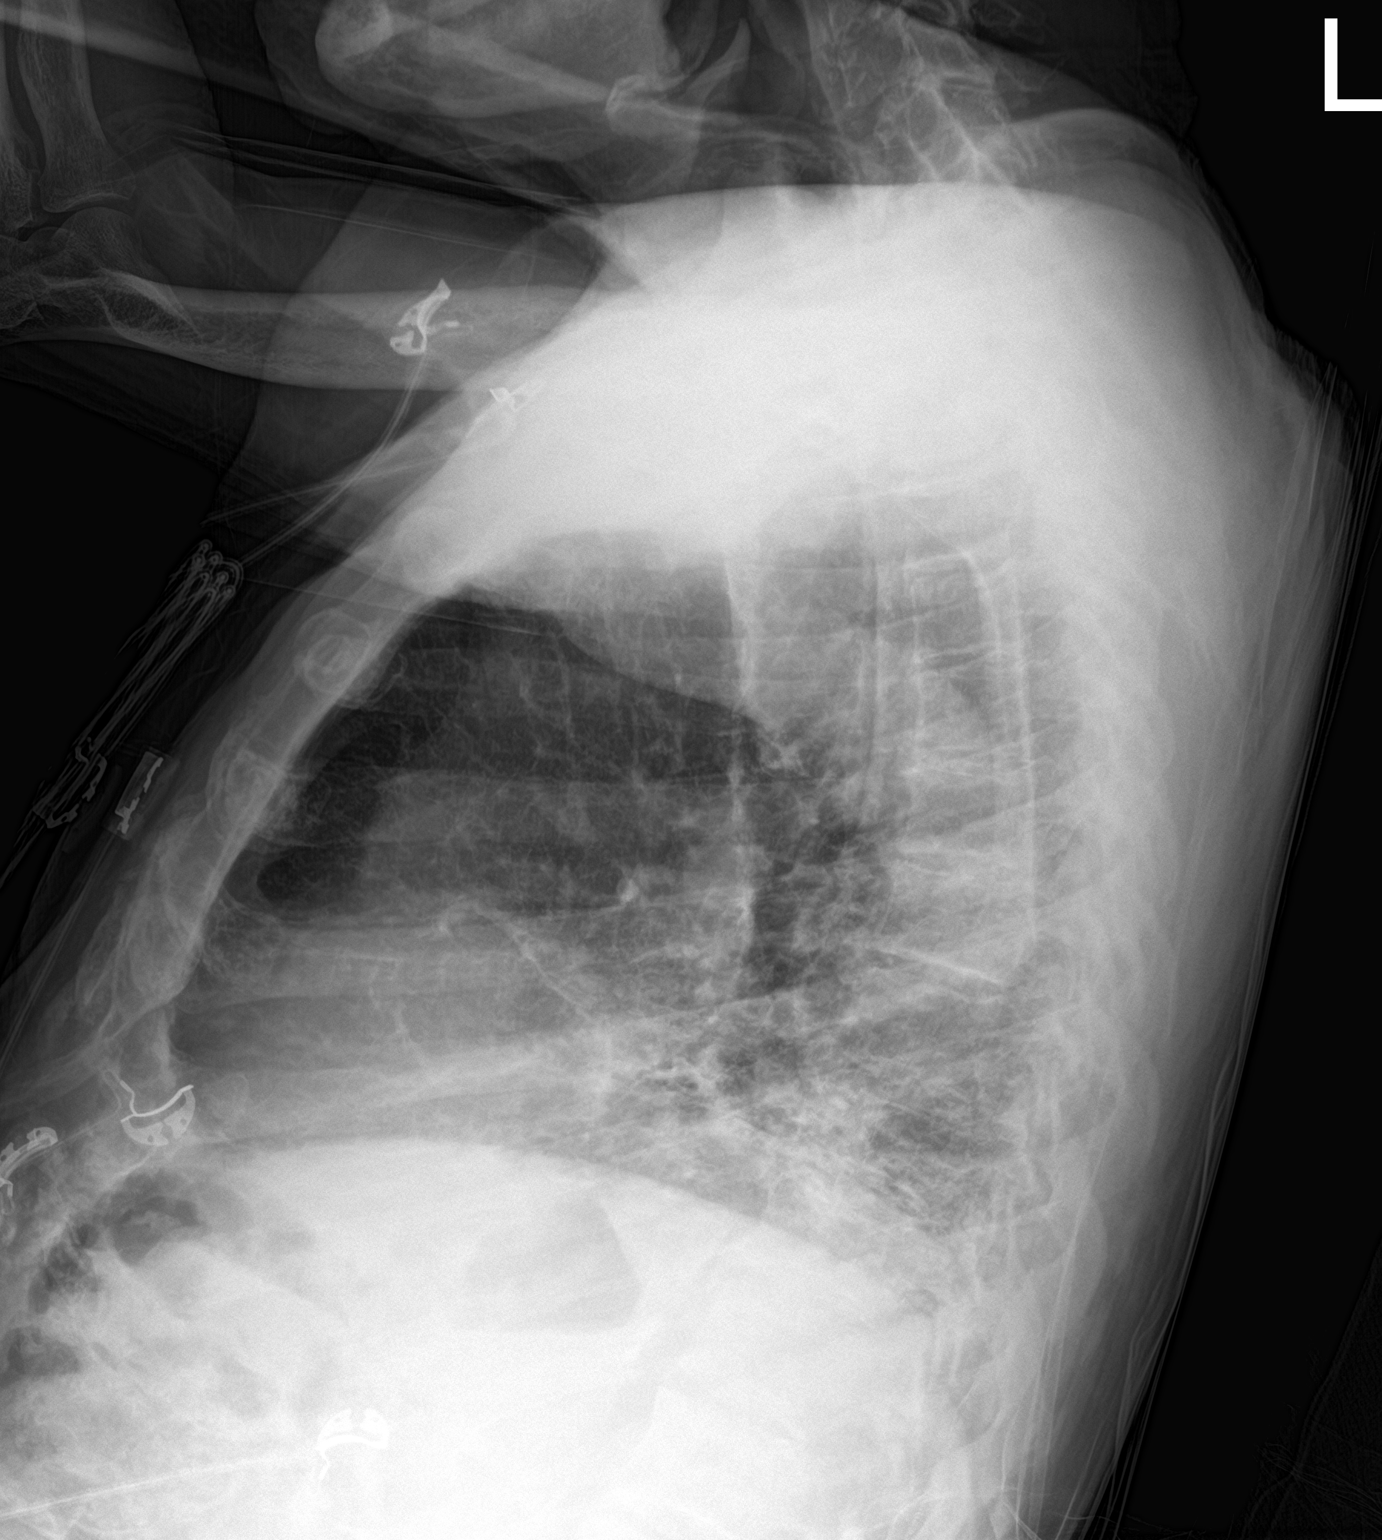

[chest ap strecther]
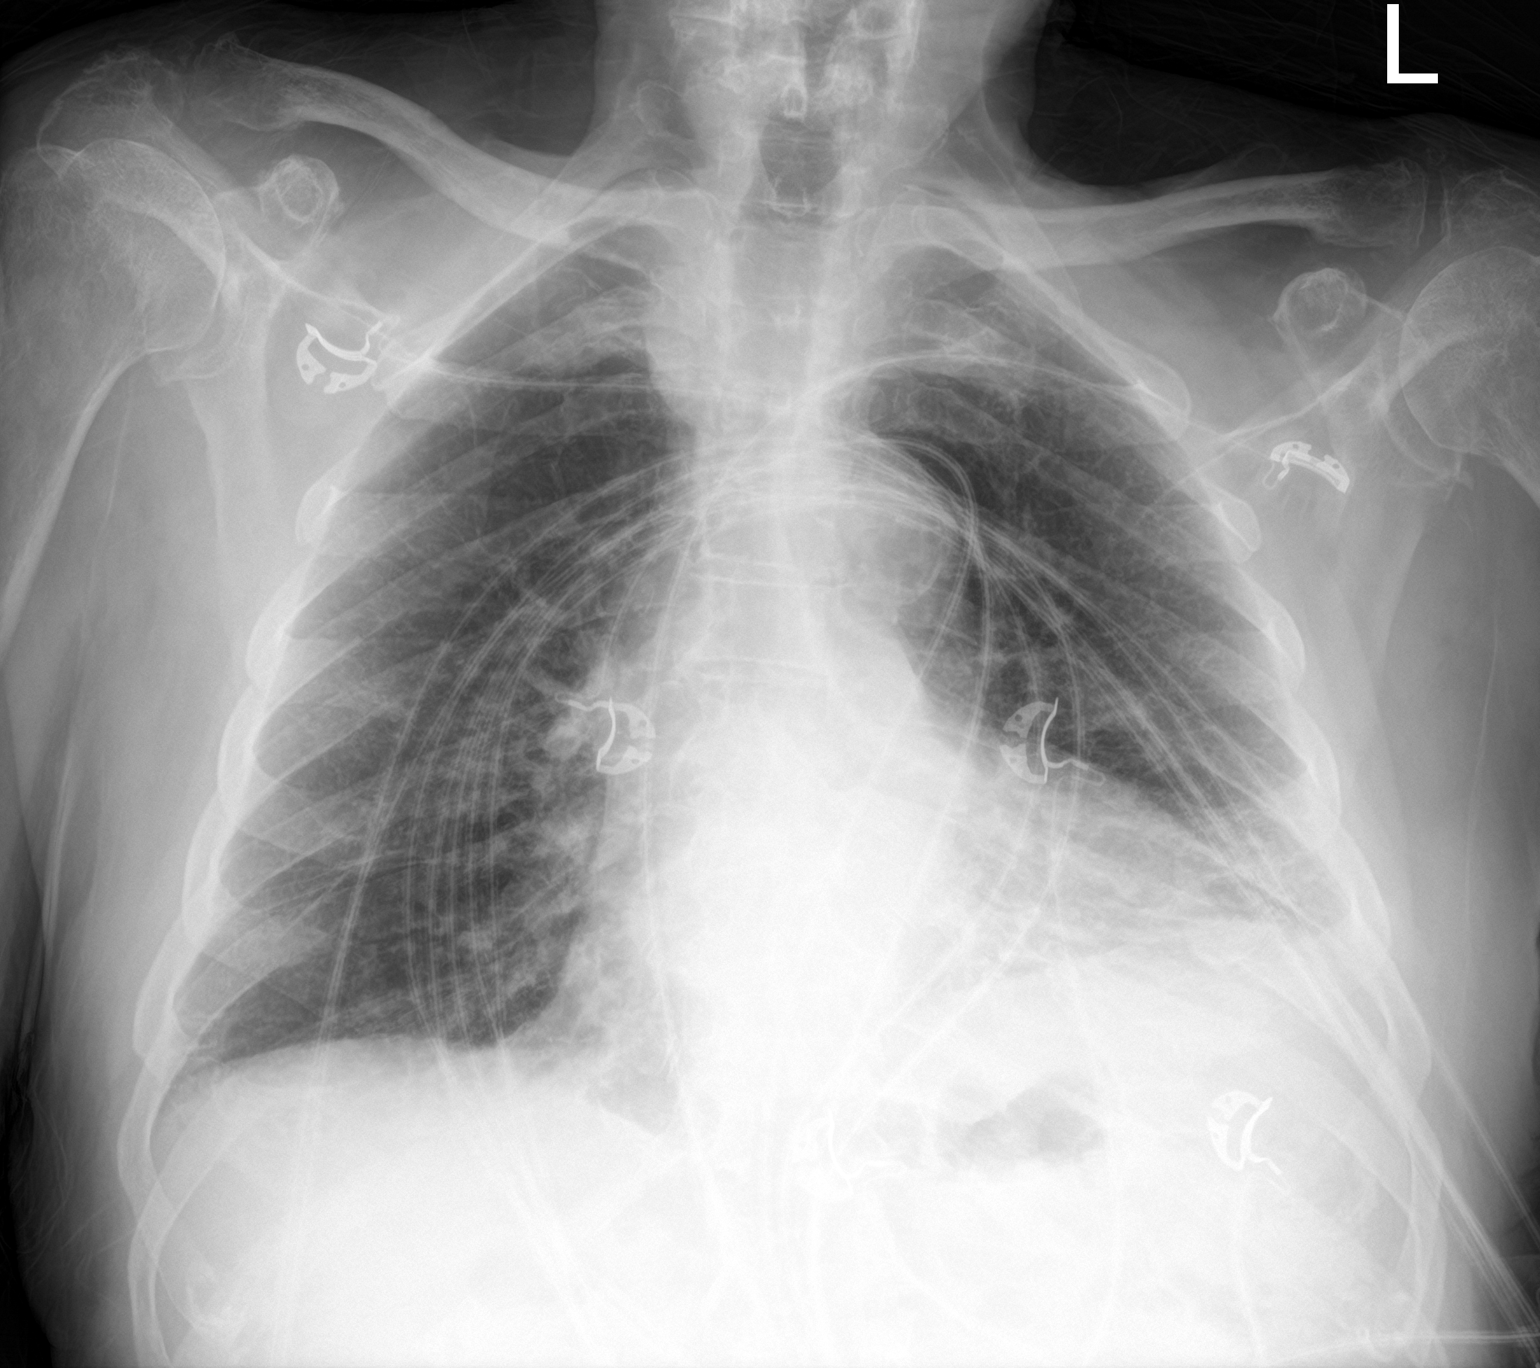

[2 of 2 positions shown; findings below may reference images not displayed]

FINDINGS: Stable cardiac silhouette, at the upper limits of normal in size.
Normal pulmonary vascularity. Stable left greater than right
bibasilar atelectasis/scarring. No focal consolidation, pleural
effusion, or pneumothorax. No acute osseous abnormality.
IMPRESSION: Bibasilar atelectasis/scarring, unchanged. No active cardiopulmonary
disease.
